# Patient Record
Sex: Female | Born: 1970 | Race: White | Hispanic: No | Smoking: Never smoker
Health system: Southern US, Community
[De-identification: ages and names within clinical notes are randomized; demographics above are authoritative.]

## PROBLEM LIST (undated history)

## (undated) DIAGNOSIS — F32A Depression, unspecified: Secondary | ICD-10-CM

## (undated) DIAGNOSIS — M419 Scoliosis, unspecified: Secondary | ICD-10-CM

## (undated) DIAGNOSIS — Z8489 Family history of other specified conditions: Secondary | ICD-10-CM

## (undated) DIAGNOSIS — M2550 Pain in unspecified joint: Secondary | ICD-10-CM

## (undated) DIAGNOSIS — M7989 Other specified soft tissue disorders: Secondary | ICD-10-CM

## (undated) DIAGNOSIS — G4730 Sleep apnea, unspecified: Secondary | ICD-10-CM

## (undated) DIAGNOSIS — R0602 Shortness of breath: Secondary | ICD-10-CM

## (undated) DIAGNOSIS — I10 Essential (primary) hypertension: Secondary | ICD-10-CM

## (undated) DIAGNOSIS — R7303 Prediabetes: Secondary | ICD-10-CM

## (undated) DIAGNOSIS — M549 Dorsalgia, unspecified: Secondary | ICD-10-CM

## (undated) HISTORY — DX: Dorsalgia, unspecified: M54.9

## (undated) HISTORY — DX: Other specified soft tissue disorders: M79.89

## (undated) HISTORY — DX: Shortness of breath: R06.02

## (undated) HISTORY — DX: Pain in unspecified joint: M25.50

## (undated) HISTORY — DX: Essential (primary) hypertension: I10

## (undated) HISTORY — DX: Prediabetes: R73.03

## (undated) HISTORY — PX: WISDOM TOOTH EXTRACTION: SHX21

## (undated) HISTORY — DX: Sleep apnea, unspecified: G47.30

## (undated) HISTORY — DX: Depression, unspecified: F32.A

---

## 2016-12-22 ENCOUNTER — Encounter (HOSPITAL_COMMUNITY): Payer: Self-pay | Admitting: Nurse Practitioner

## 2016-12-22 ENCOUNTER — Emergency Department (HOSPITAL_COMMUNITY)
Admission: EM | Admit: 2016-12-22 | Discharge: 2016-12-22 | Disposition: A | Discharge status: Home / Self Care | Payer: No Typology Code available for payment source | Attending: Emergency Medicine | Admitting: Emergency Medicine

## 2016-12-22 DIAGNOSIS — T148XXA Other injury of unspecified body region, initial encounter: Secondary | ICD-10-CM

## 2016-12-22 DIAGNOSIS — S4992XA Unspecified injury of left shoulder and upper arm, initial encounter: Secondary | ICD-10-CM | POA: Diagnosis present

## 2016-12-22 DIAGNOSIS — Y92410 Unspecified street and highway as the place of occurrence of the external cause: Secondary | ICD-10-CM | POA: Diagnosis not present

## 2016-12-22 DIAGNOSIS — Y9389 Activity, other specified: Secondary | ICD-10-CM | POA: Diagnosis not present

## 2016-12-22 DIAGNOSIS — S46912A Strain of unspecified muscle, fascia and tendon at shoulder and upper arm level, left arm, initial encounter: Secondary | ICD-10-CM | POA: Diagnosis not present

## 2016-12-22 DIAGNOSIS — Y999 Unspecified external cause status: Secondary | ICD-10-CM | POA: Insufficient documentation

## 2016-12-22 MED ORDER — CYCLOBENZAPRINE HCL 5 MG PO TABS: 5.0000 mg | ORAL_TABLET | Freq: Three times a day (TID) | ORAL | 0 refills | Status: DC | PRN
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
## 2021-01-11 ENCOUNTER — Other Ambulatory Visit: Payer: Self-pay

## 2021-01-11 ENCOUNTER — Emergency Department (HOSPITAL_COMMUNITY)
Admission: EM | Admit: 2021-01-11 | Discharge: 2021-01-11 | Disposition: A | Discharge status: Home / Self Care | Payer: Self-pay | Attending: Emergency Medicine | Admitting: Emergency Medicine

## 2021-01-11 ENCOUNTER — Encounter (HOSPITAL_COMMUNITY): Payer: Self-pay

## 2021-01-11 DIAGNOSIS — M792 Neuralgia and neuritis, unspecified: Secondary | ICD-10-CM | POA: Insufficient documentation

## 2021-01-11 MED ORDER — GABAPENTIN 100 MG PO CAPS: 100.0000 mg | ORAL_CAPSULE | Freq: Three times a day (TID) | ORAL | 0 refills | Status: DC
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
## 2021-01-11 NOTE — ED Notes (Signed)
An After Visit Summary was printed and given to the patient. Discharge instructions given and no further questions at this time.  

## 2021-01-11 NOTE — ED Triage Notes (Signed)
Patient c/o mid back pain since January. Patient states she has been seen twice for the same and states she had an x-ray yesterday.

## 2021-01-11 NOTE — Discharge Instructions (Addendum)
Your symptoms may be due to bra neuralgia or irritation to the nerve along your bra strap.  Please try using a larger bra or loosefitting bra to decrease irritation.  Take gabapentin as prescribed.  Follow-up with your doctor for further care.  Return if you have any concern.

## 2021-01-16 ENCOUNTER — Emergency Department (HOSPITAL_COMMUNITY): Payer: Self-pay

## 2021-01-16 ENCOUNTER — Inpatient Hospital Stay (HOSPITAL_BASED_OUTPATIENT_CLINIC_OR_DEPARTMENT_OTHER)
Admission: EM | Admit: 2021-01-16 | Discharge: 2021-01-24 | DRG: 478 | Disposition: A | Discharge status: Home Health | Payer: Self-pay | Attending: Internal Medicine | Admitting: Internal Medicine

## 2021-01-16 ENCOUNTER — Encounter (HOSPITAL_BASED_OUTPATIENT_CLINIC_OR_DEPARTMENT_OTHER): Payer: Self-pay

## 2021-01-16 ENCOUNTER — Telehealth: Payer: Self-pay | Admitting: Physician Assistant

## 2021-01-16 ENCOUNTER — Other Ambulatory Visit: Payer: Self-pay

## 2021-01-16 ENCOUNTER — Emergency Department (HOSPITAL_BASED_OUTPATIENT_CLINIC_OR_DEPARTMENT_OTHER): Payer: Self-pay

## 2021-01-16 DIAGNOSIS — R946 Abnormal results of thyroid function studies: Secondary | ICD-10-CM | POA: Diagnosis present

## 2021-01-16 DIAGNOSIS — E876 Hypokalemia: Secondary | ICD-10-CM | POA: Diagnosis not present

## 2021-01-16 DIAGNOSIS — Z20822 Contact with and (suspected) exposure to covid-19: Secondary | ICD-10-CM | POA: Diagnosis present

## 2021-01-16 DIAGNOSIS — R0689 Other abnormalities of breathing: Secondary | ICD-10-CM | POA: Diagnosis present

## 2021-01-16 DIAGNOSIS — S22000A Wedge compression fracture of unspecified thoracic vertebra, initial encounter for closed fracture: Secondary | ICD-10-CM

## 2021-01-16 DIAGNOSIS — K5900 Constipation, unspecified: Secondary | ICD-10-CM | POA: Diagnosis not present

## 2021-01-16 DIAGNOSIS — M419 Scoliosis, unspecified: Secondary | ICD-10-CM | POA: Diagnosis present

## 2021-01-16 DIAGNOSIS — R079 Chest pain, unspecified: Secondary | ICD-10-CM

## 2021-01-16 DIAGNOSIS — Z6841 Body Mass Index (BMI) 40.0 and over, adult: Secondary | ICD-10-CM

## 2021-01-16 DIAGNOSIS — M4624 Osteomyelitis of vertebra, thoracic region: Secondary | ICD-10-CM | POA: Diagnosis present

## 2021-01-16 DIAGNOSIS — Z841 Family history of disorders of kidney and ureter: Secondary | ICD-10-CM

## 2021-01-16 DIAGNOSIS — Z597 Insufficient social insurance and welfare support: Secondary | ICD-10-CM

## 2021-01-16 DIAGNOSIS — Z79899 Other long term (current) drug therapy: Secondary | ICD-10-CM

## 2021-01-16 DIAGNOSIS — M4640 Discitis, unspecified, site unspecified: Secondary | ICD-10-CM

## 2021-01-16 DIAGNOSIS — M545 Low back pain, unspecified: Secondary | ICD-10-CM

## 2021-01-16 DIAGNOSIS — M2578 Osteophyte, vertebrae: Secondary | ICD-10-CM | POA: Diagnosis present

## 2021-01-16 DIAGNOSIS — Z833 Family history of diabetes mellitus: Secondary | ICD-10-CM

## 2021-01-16 DIAGNOSIS — M4644 Discitis, unspecified, thoracic region: Principal | ICD-10-CM

## 2021-01-16 DIAGNOSIS — R140 Abdominal distension (gaseous): Secondary | ICD-10-CM | POA: Diagnosis present

## 2021-01-16 DIAGNOSIS — Z8249 Family history of ischemic heart disease and other diseases of the circulatory system: Secondary | ICD-10-CM

## 2021-01-16 DIAGNOSIS — M4854XA Collapsed vertebra, not elsewhere classified, thoracic region, initial encounter for fracture: Secondary | ICD-10-CM | POA: Diagnosis present

## 2021-01-16 HISTORY — DX: Family history of other specified conditions: Z84.89

## 2021-01-16 HISTORY — DX: Scoliosis, unspecified: M41.9

## 2021-01-16 HISTORY — DX: Morbid (severe) obesity due to excess calories: E66.01

## 2021-01-16 LAB — CBC WITH DIFFERENTIAL/PLATELET
Abs Immature Granulocytes: 0.03 10*3/uL (ref 0.00–0.07)
Basophils Absolute: 0 10*3/uL (ref 0.0–0.1)
Basophils Relative: 0 %
Eosinophils Absolute: 0.2 10*3/uL (ref 0.0–0.5)
Eosinophils Relative: 2 %
HCT: 38.1 % (ref 36.0–46.0)
Hemoglobin: 12.3 g/dL (ref 12.0–15.0)
Immature Granulocytes: 0 %
Lymphocytes Relative: 16 %
Lymphs Abs: 1.8 10*3/uL (ref 0.7–4.0)
MCH: 29.8 pg (ref 26.0–34.0)
MCHC: 32.3 g/dL (ref 30.0–36.0)
MCV: 92.3 fL (ref 80.0–100.0)
Monocytes Absolute: 0.6 10*3/uL (ref 0.1–1.0)
Monocytes Relative: 5 %
Neutro Abs: 8.7 10*3/uL — ABNORMAL HIGH (ref 1.7–7.7)
Neutrophils Relative %: 77 %
Platelets: 404 10*3/uL — ABNORMAL HIGH (ref 150–400)
RBC: 4.13 MIL/uL (ref 3.87–5.11)
RDW: 14.3 % (ref 11.5–15.5)
WBC: 11.3 10*3/uL — ABNORMAL HIGH (ref 4.0–10.5)
nRBC: 0 % (ref 0.0–0.2)

## 2021-01-16 LAB — COMPREHENSIVE METABOLIC PANEL
ALT: 14 U/L (ref 0–44)
AST: 12 U/L — ABNORMAL LOW (ref 15–41)
Albumin: 3.9 g/dL (ref 3.5–5.0)
Alkaline Phosphatase: 69 U/L (ref 38–126)
Anion gap: 10 (ref 5–15)
BUN: 9 mg/dL (ref 6–20)
CO2: 26 mmol/L (ref 22–32)
Calcium: 9.1 mg/dL (ref 8.9–10.3)
Chloride: 104 mmol/L (ref 98–111)
Creatinine, Ser: 0.71 mg/dL (ref 0.44–1.00)
GFR, Estimated: >60 mL/min (ref >60)
Glucose, Bld: 114 mg/dL — ABNORMAL HIGH (ref 70–99)
Potassium: 3.9 mmol/L (ref 3.5–5.1)
Sodium: 140 mmol/L (ref 135–145)
Total Bilirubin: 1 mg/dL (ref 0.3–1.2)
Total Protein: 7.1 g/dL (ref 6.5–8.1)

## 2021-01-16 LAB — TROPONIN I (HIGH SENSITIVITY): Troponin I (High Sensitivity): 2 ng/L (ref <18)

## 2021-01-16 LAB — LIPASE, BLOOD: Lipase: 13 U/L (ref 11–51)

## 2021-01-16 LAB — RESP PANEL BY RT-PCR (FLU A&B, COVID) ARPGX2
Influenza A by PCR: NEGATIVE
Influenza B by PCR: NEGATIVE
SARS Coronavirus 2 by RT PCR: NEGATIVE

## 2021-01-16 IMAGING — MR MR THORACIC SPINE WO/W CM
4 of 9 series · 17 of 48 positions shown · IV contrast (gadavist)
Comparison: Prior CT from [DATE]

CLINICAL DATA: Initial evaluation for compression fracture.

EXAM:
MRI THORACIC WITHOUT AND WITH CONTRAST
TECHNIQUE: Multiplanar and multiecho pulse sequences of the thoracic spine were
obtained without and with intravenous contrast.
CONTRAST:  10mL GADAVIST GADOBUTROL 1 MMOL/ML IV SOLN

[Series 3: T1 · sagittal · 3.0mm · 0.90mm/px · 3 of 14 slices shown (1 of 2)]
[im 1/14]
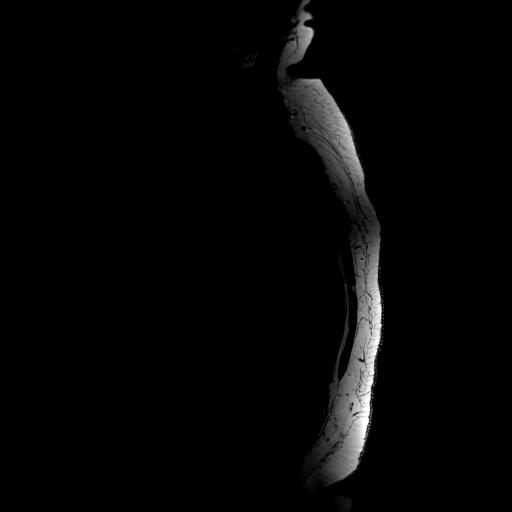
[im 9/14]
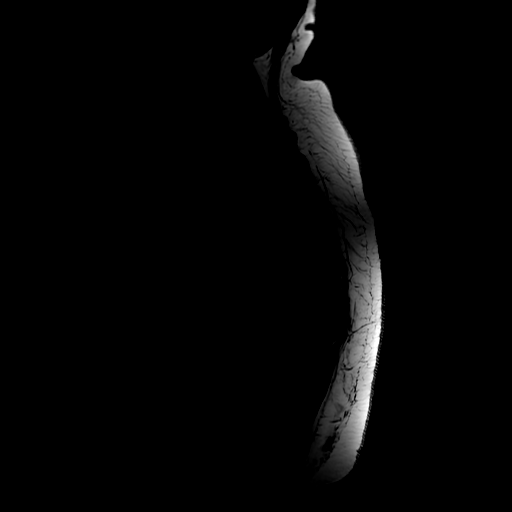
[im 14/14]
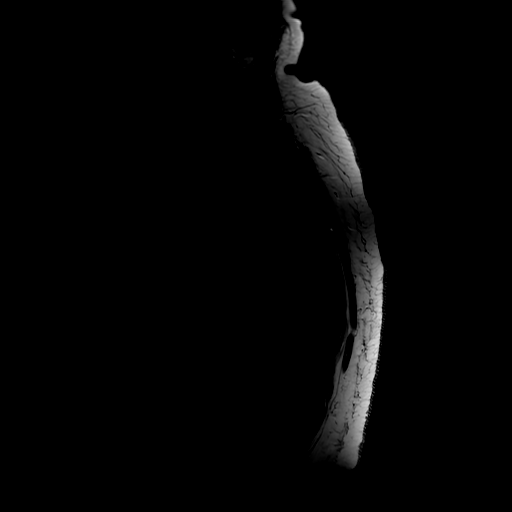

[Series 4: T2 · sagittal · 3.0mm · 0.66mm/px · 3 of 15 slices shown (1 of 2)]
[im 1/15]
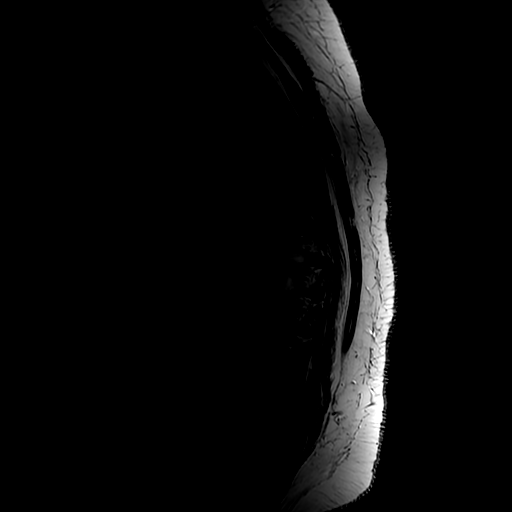
[im 8/15]
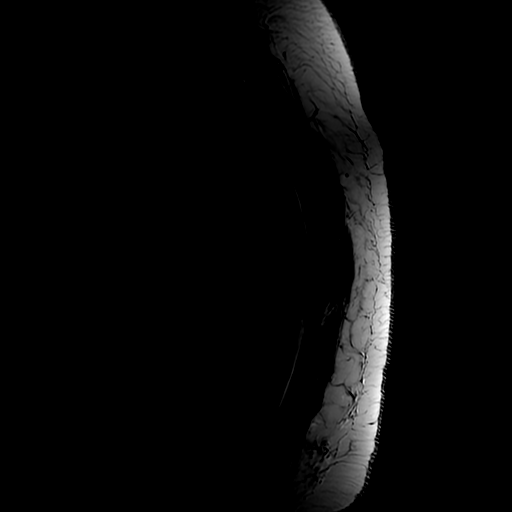
[im 15/15]
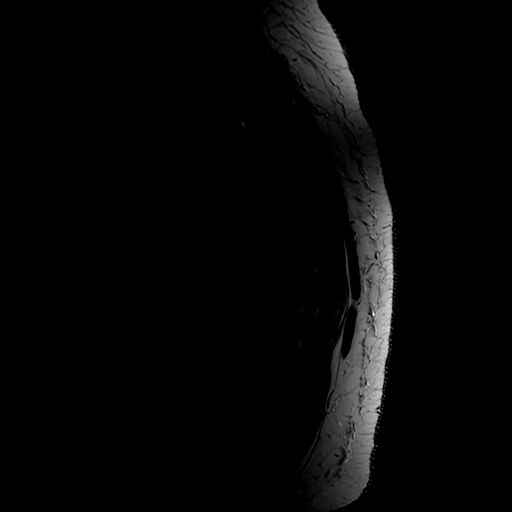

[Series 6: T1 · sagittal · 3.0mm · 0.66mm/px · 3 of 15 slices shown (2 of 2)]
[im 1/15]
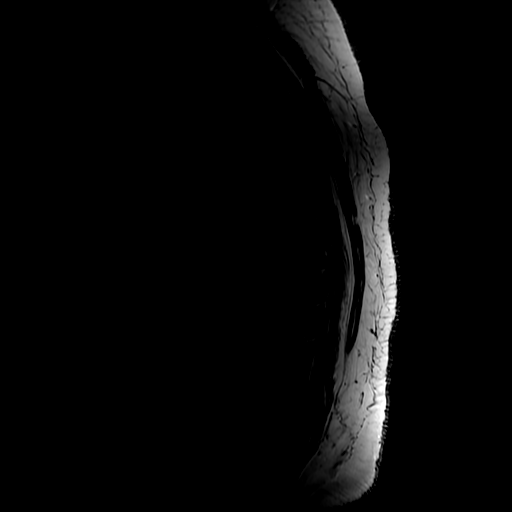
[im 8/15]
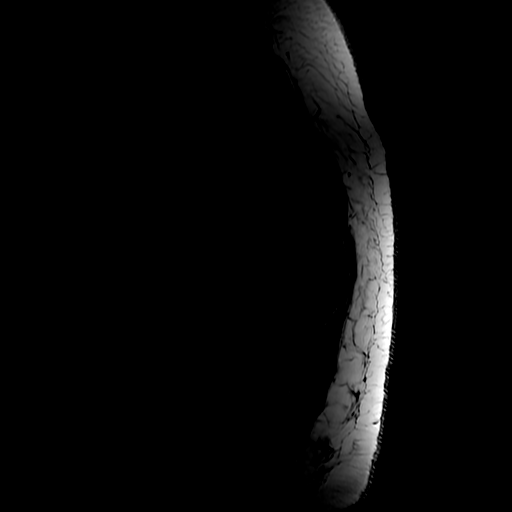
[im 15/15]
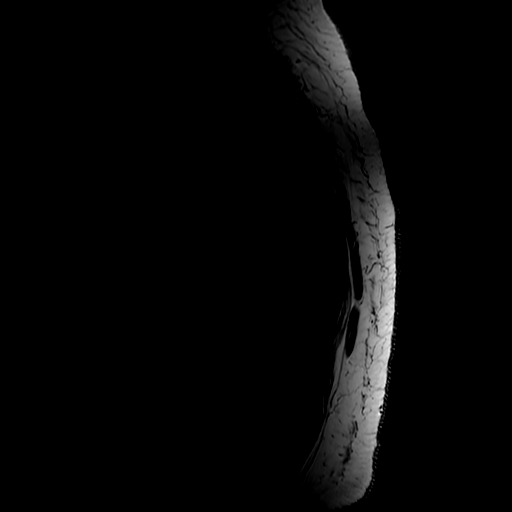

[Series 7: T2 · axial · 4.0mm · 0.39mm/px · z∈[-126,+53]mm · 8 of 34 slices shown (2 of 2)]
[im 1/34]
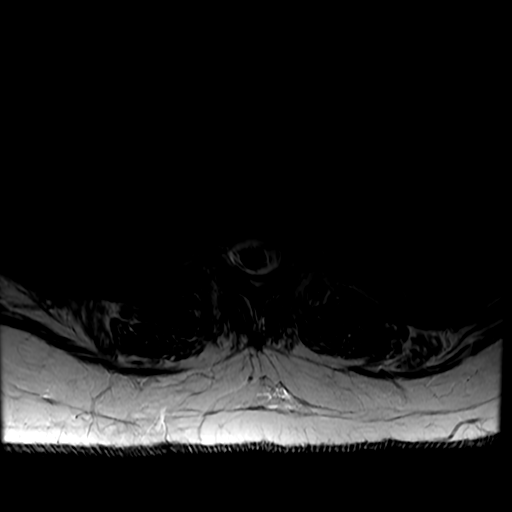
[im 5/34]
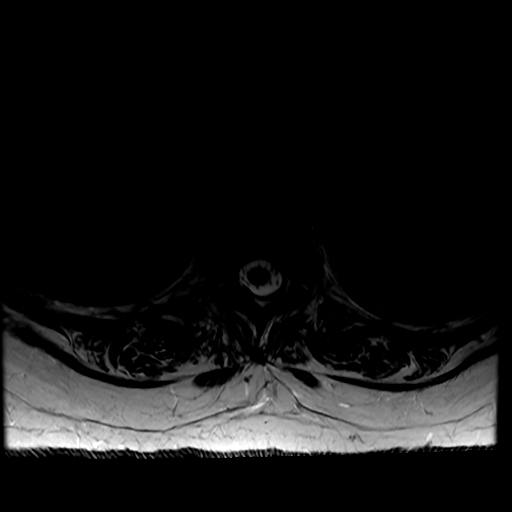
[im 10/34]
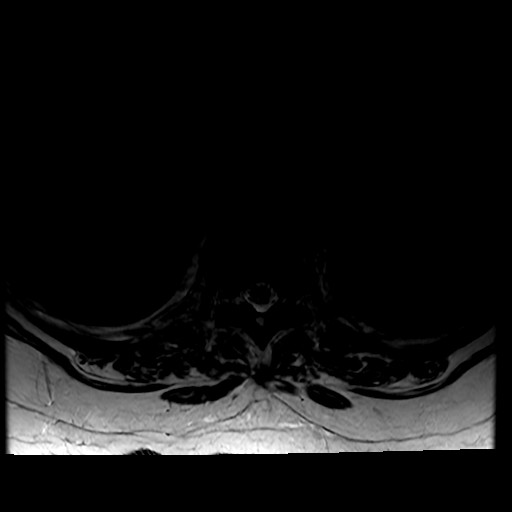
[im 15/34]
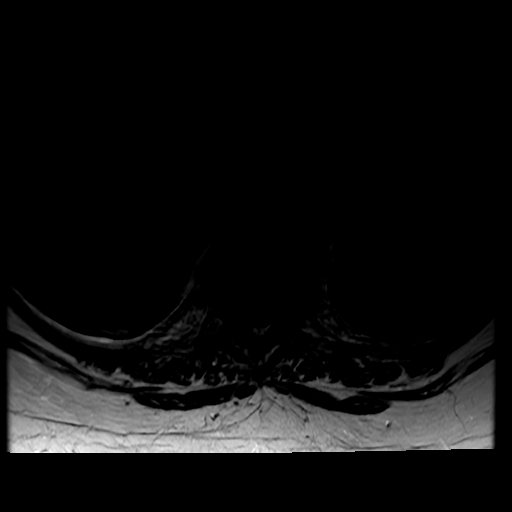
[im 19/34]
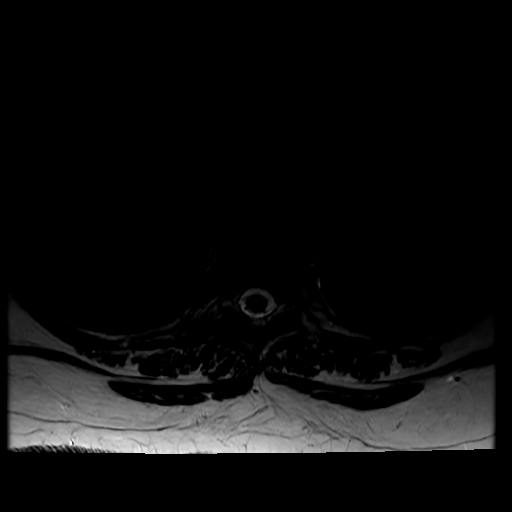
[im 24/34]
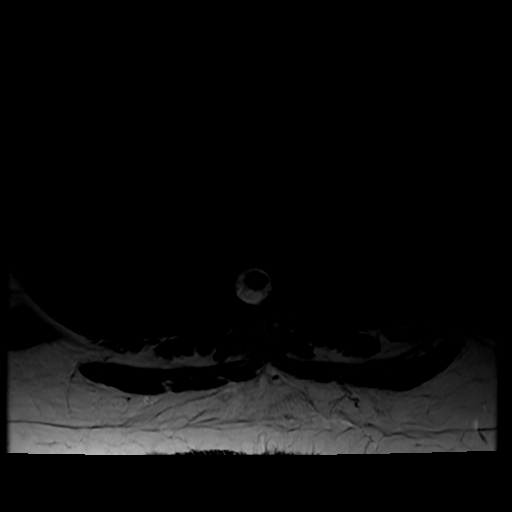
[im 29/34]
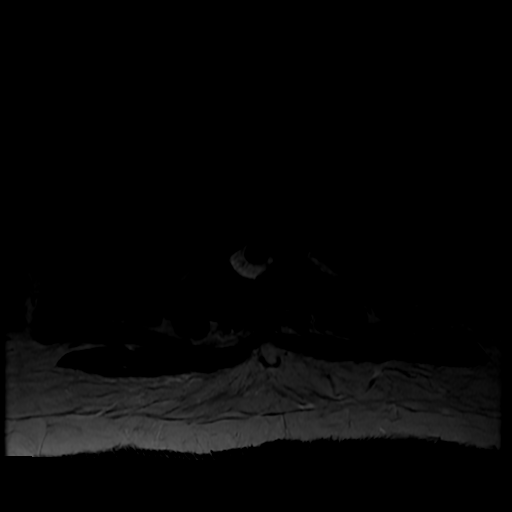
[im 34/34]
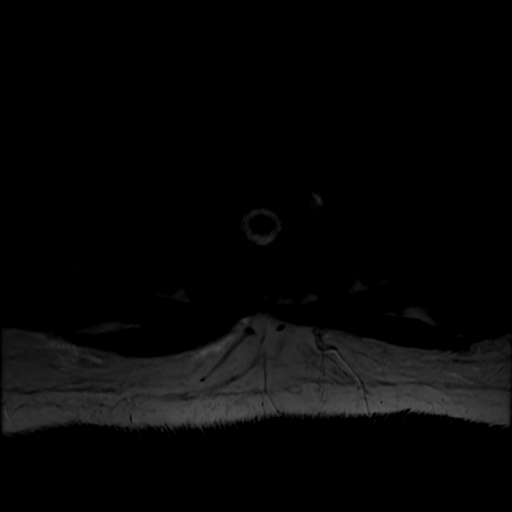

[17 of 48 positions shown; findings below may reference images not displayed]

FINDINGS: Alignment: Slight exaggeration of the normal thoracic kyphosis, apex
at T8-9. No listhesis.

Vertebrae: Abnormal edema and enhancement with loss of disc space
height and endplate irregularity seen about the T8-9 interspace,
consistent with acute osteomyelitis discitis. Associated height loss
of up to approximately 75% at the adjacent T9 vertebral body, with
more moderate approximate 50% height loss at T8. Edema and
enhancement extends into the T8-9 facets bilaterally, consistent
with associated septic arthritis. Evidence for associated epidural
phlegmon and enhancement within both the ventral and dorsal epidural
space at the level of T8-9 (series 10, image 8). No frank epidural
abscess visible on this motion degraded exam. Additionally, mild
marrow edema and enhancement with disc space height loss noted about
the adjacent T7-8 interspace, also concerning for acute
osteomyelitis discitis as well. This is more mild in appearance as
compared to the adjacent T8-9 interspace. Associated paraspinous
edema and enhancement adjacent to the infected T8-9 interspace
without visible soft tissue abscess.

No other sites of acute infection within the thoracic spine.
Vertebral body height otherwise maintained. Diffusely decreased T1
signal intensity seen throughout the visualized bone marrow,
nonspecific, but most commonly related to anemia, smoking or
obesity. No worrisome osseous lesions.

Cord: No convincing cord signal abnormality seen on this motion
degraded exam.

Paraspinal and other soft tissues: Paraspinous edema and enhancement
adjacent to the T8-9 interspace, consistent with infection. No
discrete soft tissue collections or abscesses. Small layering
bilateral pleural effusions noted.

Disc levels:

T8-9: Findings consistent with osteomyelitis discitis. Associated
compression deformity at the superior endplate of T9 with up to
50-60% height loss and 4 mm bony retropulsion. Epidural phlegmon and
enhancement within the ventral and dorsal epidural space without
definite frank abscess formation. Resultant moderate spinal stenosis
with mild cord flattening. No definite cord signal changes on this
motion degraded exam. Moderate right worse than left foraminal
narrowing.

Additional fairly mild degenerative changes for age noted elsewhere
within the thoracic spine. No other significant canal or foraminal
stenosis. No other neural impingement.
IMPRESSION: 1. Findings consistent with acute osteomyelitis discitis and septic
arthritis at T8-9. Associated compression fracture of T9 with up to
75% anterior height loss and 4 mm bony retropulsion, with 50% height
loss at T8. Superimposed epidural phlegmon at this level with
resultant moderate spinal stenosis and mild cord flattening, but no
definite cord signal changes on this motion degraded exam.
2. Mild edema and enhancement about the adjacent T7-8 interspace,
also consistent with acute osteomyelitis discitis.
3. Associated paraspinous edema and enhancement adjacent to the T8-9
interspace without discrete soft tissue abscess.
4. Small layering bilateral pleural effusions.

## 2021-01-16 IMAGING — CT CT ANGIO CHEST
2 of 3 series · 18 of 32 positions shown · IV contrast (omnipaque)
Comparison: None.

CLINICAL DATA: Upper back pain that radiates.

EXAM:
CT ANGIOGRAPHY CHEST
CT ABDOMEN AND PELVIS WITH CONTRAST
TECHNIQUE: Multidetector CT imaging of the chest was performed using the
standard protocol during bolus administration of intravenous
contrast. Multiplanar CT image reconstructions and MIPs were
obtained to evaluate the vascular anatomy. Multidetector CT imaging
of the abdomen and pelvis was performed using the standard protocol
during bolus administration of intravenous contrast.
CONTRAST:  100mL OMNIPAQUE IOHEXOL 350 MG/ML SOLN

[Series 3: arterial · axial · arterial · 0.69mm/px · z∈[+1269,+1489]mm · 11 of 133 slices shown]
[im 12/133  lung]
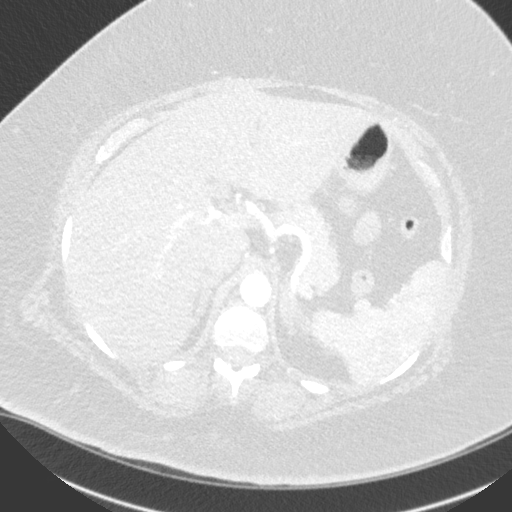
[im 23/133  soft-tissue]
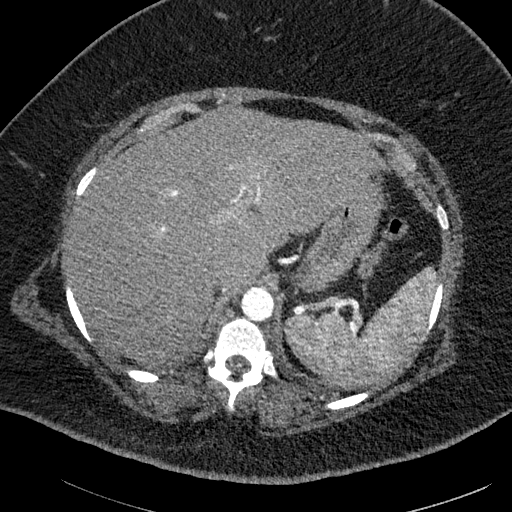
[im 34/133  lung]
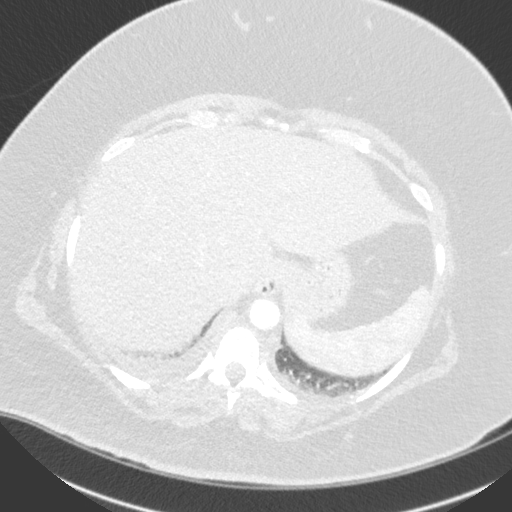
[im 45/133  soft-tissue]
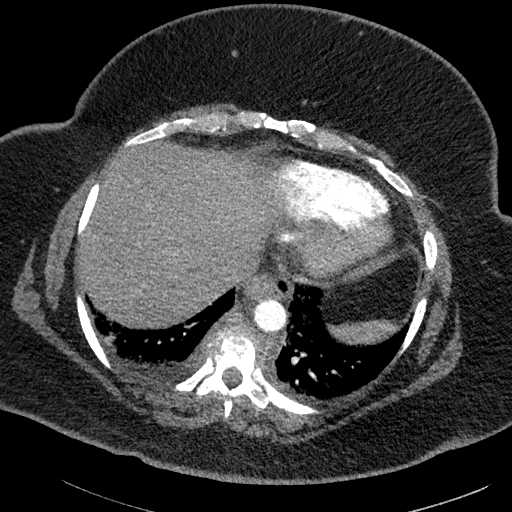
[im 56/133  lung]
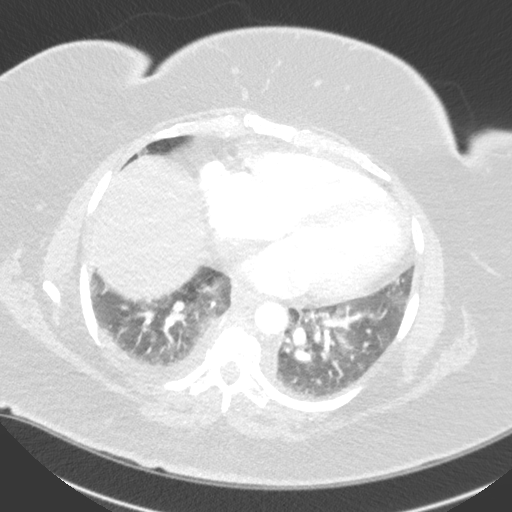
[im 67/133  soft-tissue]
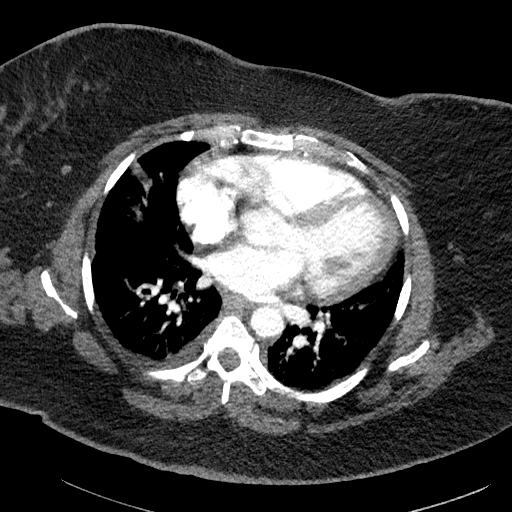
[im 78/133  lung]
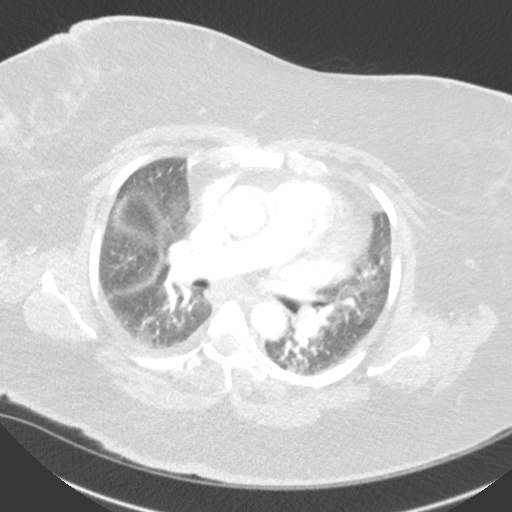
[im 89/133  soft-tissue]
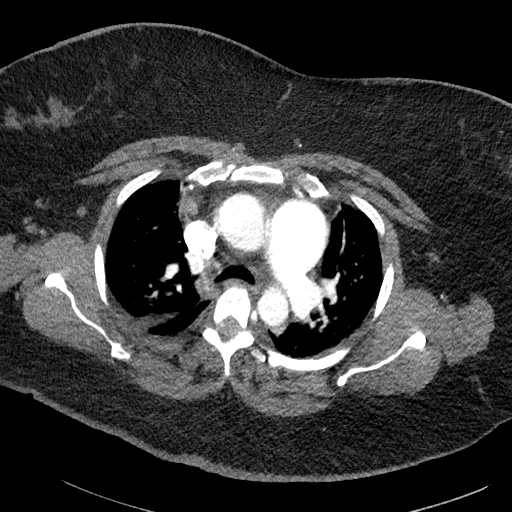
[im 100/133  lung]
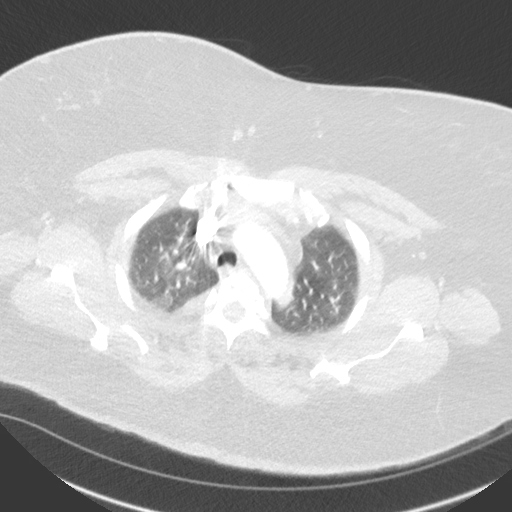
[im 111/133  soft-tissue]
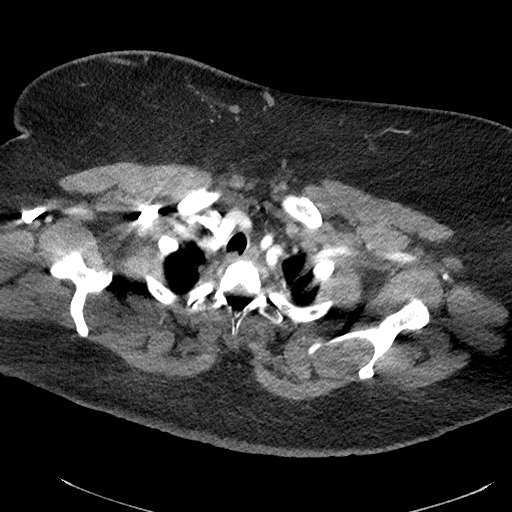
[im 122/133  lung]
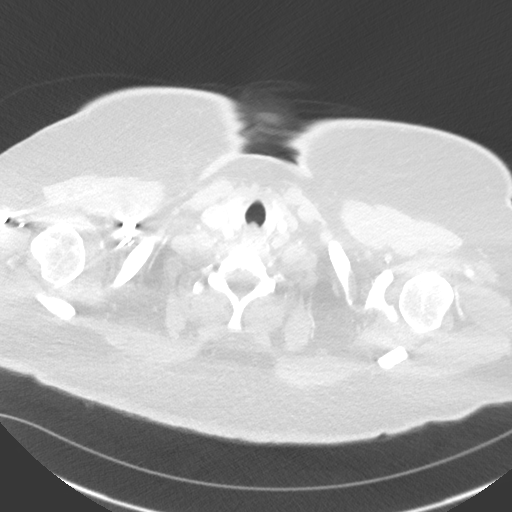

[Series 5: lung · axial · 0.69mm/px · z∈[+1269,+1445]mm · 7 of 133 slices shown]
[im 12/133  soft-tissue]
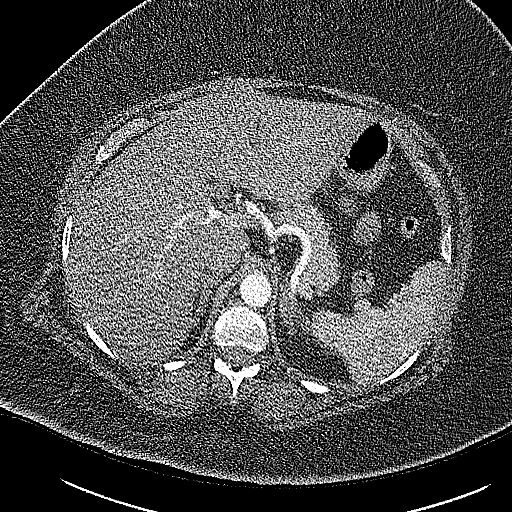
[im 34/133  soft-tissue]
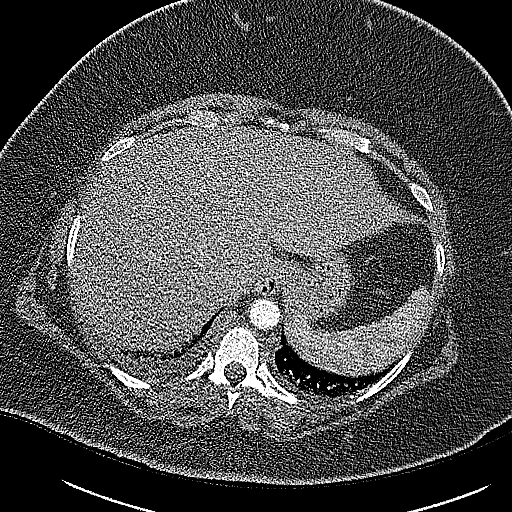
[im 45/133  soft-tissue]
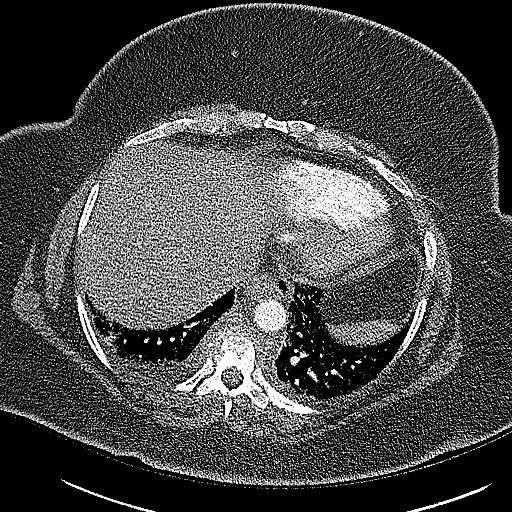
[im 56/133  soft-tissue]
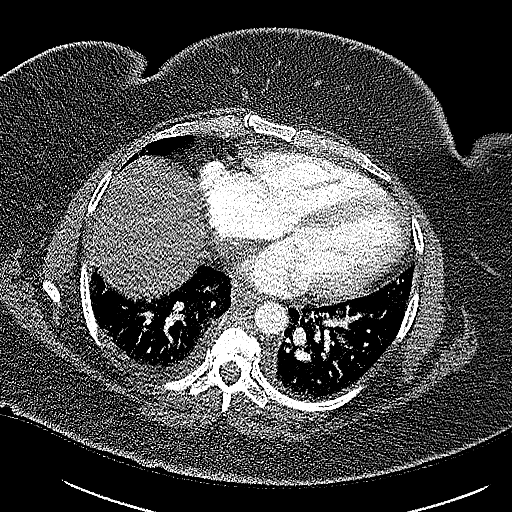
[im 78/133  soft-tissue]
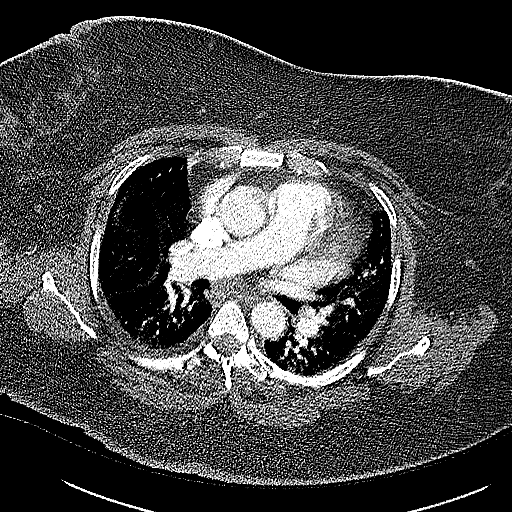
[im 89/133  soft-tissue]
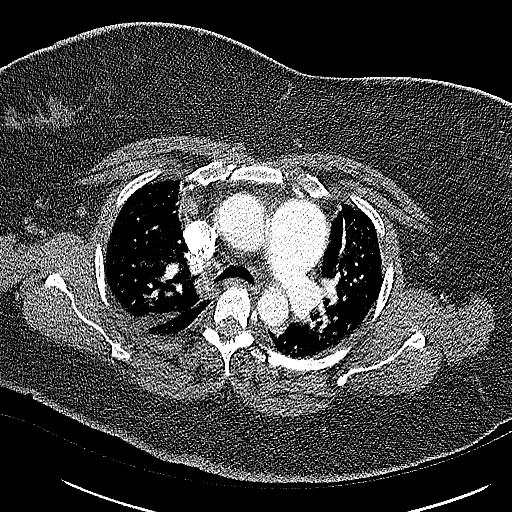
[im 100/133  soft-tissue]
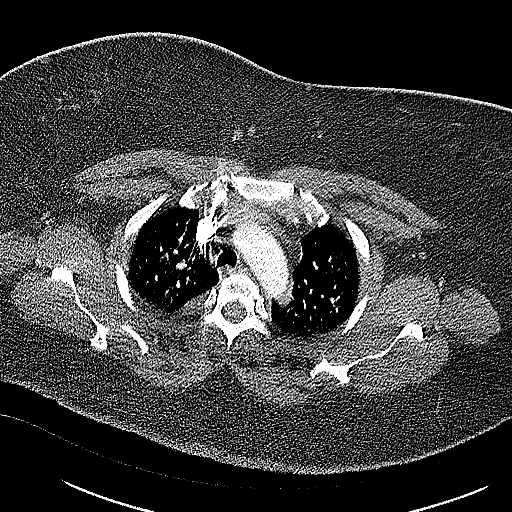

[18 of 32 positions shown; findings below may reference images not displayed]

FINDINGS: CTA CHEST FINDINGS

Cardiovascular: Noncontrast imaging demonstrates no intramural
hematoma within the thoracic aorta. Contrast enhanced imaging
demonstrates no aortic dissection or aneurysm. Great vessels are
normal. No pericardial fluid.

The main pulmonary artery is prominent at 40 mm. No filling defects
within the pulmonary arteries identified.

Mediastinum/Nodes: No axillary or supraclavicular adenopathy. No
mediastinal or hilar adenopathy. No pericardial fluid. Esophagus
normal.

Lungs/Pleura: low lung volumes. Small bilateral pleural effusions.
Bibasilar passive atelectasis. There is interlobular septal
thickening consistent with interstitial edema. No pneumothorax. No
pneumonia or infarction.

Musculoskeletal: There is a compression deformity superior endplate
of the T9 vertebral body with focal kyphosis. There is loss of
cortical definition at this level. Additionally there is soft tissue
thickening in the paravertebral space at this level (image 88/3
axial and sagittal image 96/series 7).

Review of the MIP images confirms the above findings.

CT ABDOMEN and PELVIS FINDINGS

Hepatobiliary: No focal hepatic lesion. No biliary duct dilatation.
Common bile duct is normal.

Pancreas: Pancreas is normal. No ductal dilatation. No pancreatic
inflammation.

Spleen: Normal spleen

Adrenals/urinary tract: Adrenal glands and kidneys are normal. The
ureters and bladder normal.

Stomach/Bowel: Stomach, small-bowel and cecum are normal. The
appendix is not identified but there is no pericecal inflammation to
suggest appendicitis. The colon and rectosigmoid colon are normal.

Vascular/Lymphatic: Abdominal aorta is normal caliber. No periportal
or retroperitoneal adenopathy. No pelvic adenopathy.

Reproductive: Uterus and ovaries normal.

Other: No free fluid.

Musculoskeletal: No aggressive osseous lesion.

Review of the MIP images confirms the above findings.
IMPRESSION: Chest Impression:

1. Loss of superior endplate height at T9 with associated
inflammatory process in the perivertebral soft tissue. Findings
concerning for discitis - osteomyelitis. RECOMMEND MRI of the
thoracic spine without with contrast.
2. No evidence of aortic dissection or aneurysm.
3. Prominent main pulmonary artery suggest pulmonary hypertension.
4. Low lung volumes

Abdomen / Pelvis Impression:

1. No acute findings in the abdomen pelvis.

Findings conveyed [facility]OS[facility] on [DATE]  at[DATE].

## 2021-01-16 IMAGING — CT CT ABD-PELV W/ CM
1 of 3 series · 13 of 32 positions shown, 18 images · IV contrast (omnipaque)
Comparison: None.

CLINICAL DATA: Upper back pain that radiates.

EXAM:
CT ANGIOGRAPHY CHEST
CT ABDOMEN AND PELVIS WITH CONTRAST
TECHNIQUE: Multidetector CT imaging of the chest was performed using the
standard protocol during bolus administration of intravenous
contrast. Multiplanar CT image reconstructions and MIPs were
obtained to evaluate the vascular anatomy. Multidetector CT imaging
of the abdomen and pelvis was performed using the standard protocol
during bolus administration of intravenous contrast.
CONTRAST:  100mL OMNIPAQUE IOHEXOL 350 MG/ML SOLN

[Series 4: abd pel w · axial · 0.81mm/px · z∈[+935,+1360]mm · 13 of 99 slices shown, 18 images]
[im 7/99  soft-tissue]
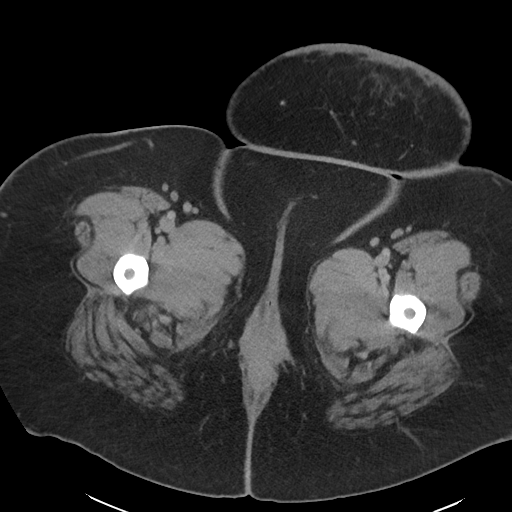
[im 7/99  bone]
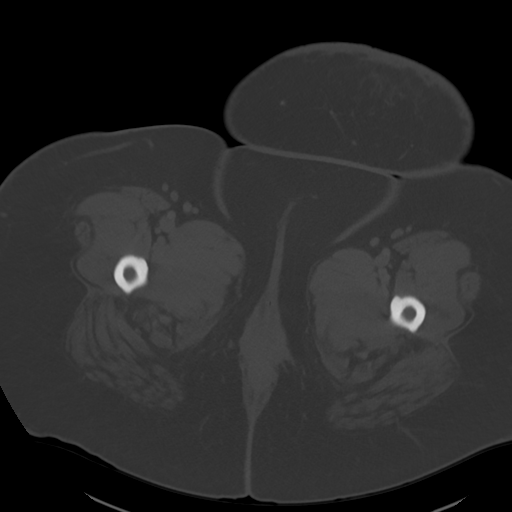
[im 13/99  soft-tissue]
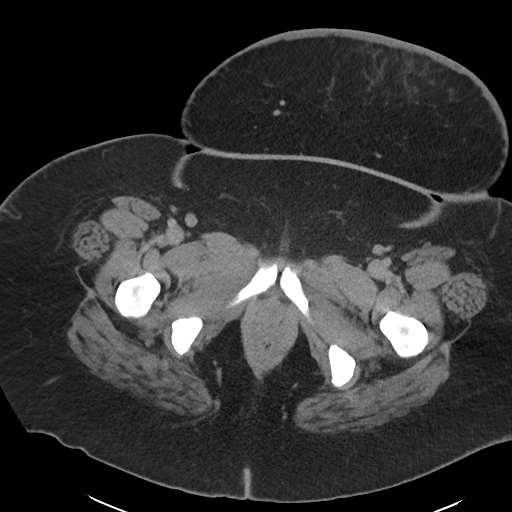
[im 25/99  soft-tissue]
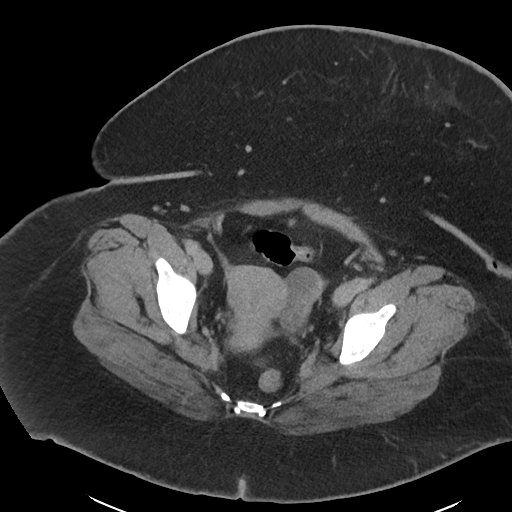
[im 31/99  soft-tissue]
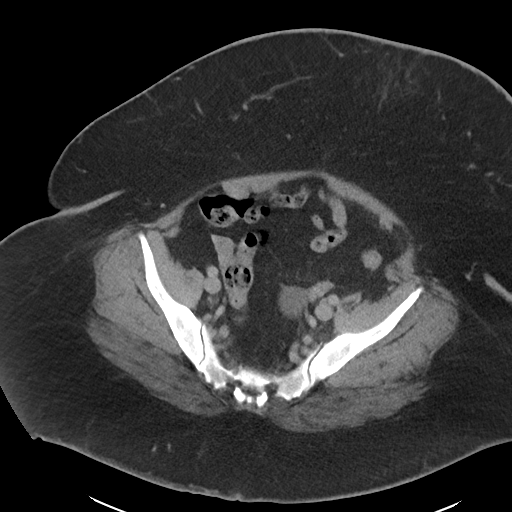
[im 37/99  soft-tissue]
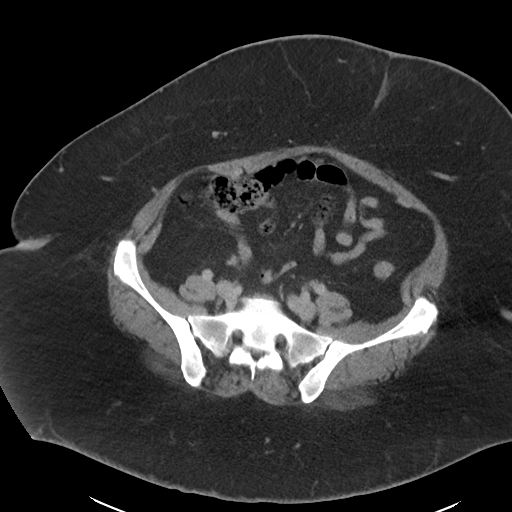
[im 43/99  soft-tissue]
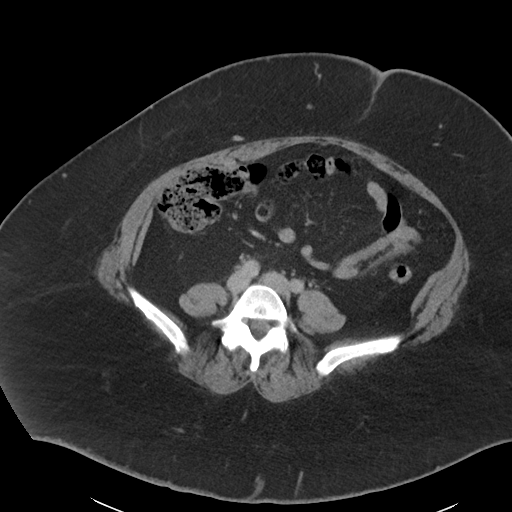
[im 56/99  soft-tissue]
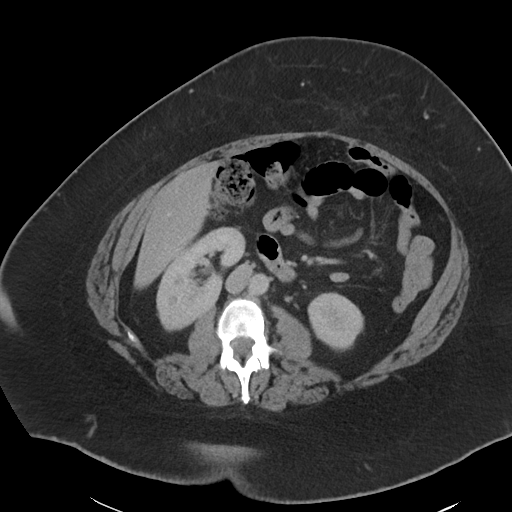
[im 62/99  soft-tissue]
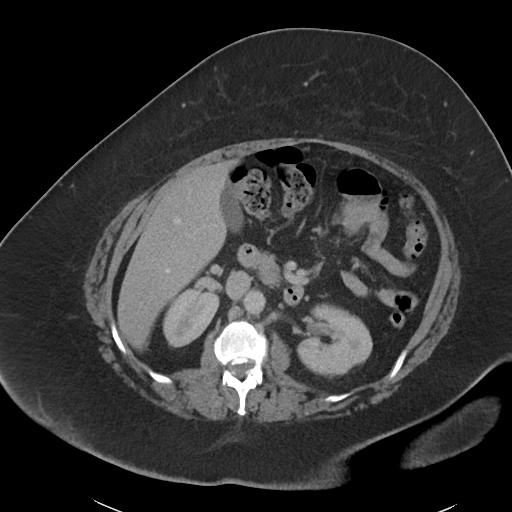
[im 68/99  soft-tissue]
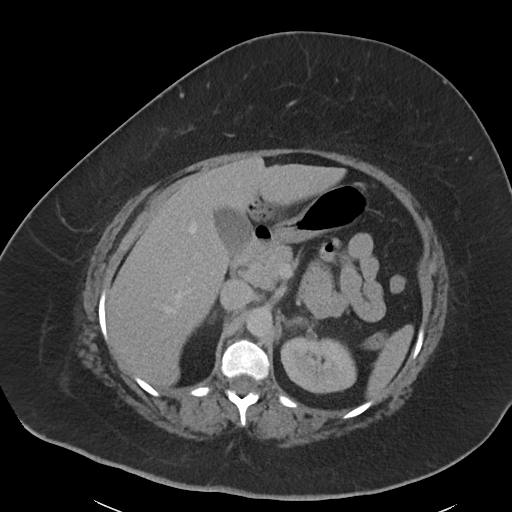
[im 68/99  bone]
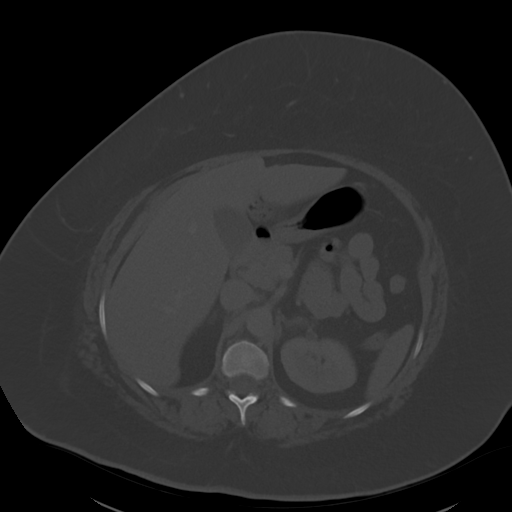
[im 74/99  soft-tissue]
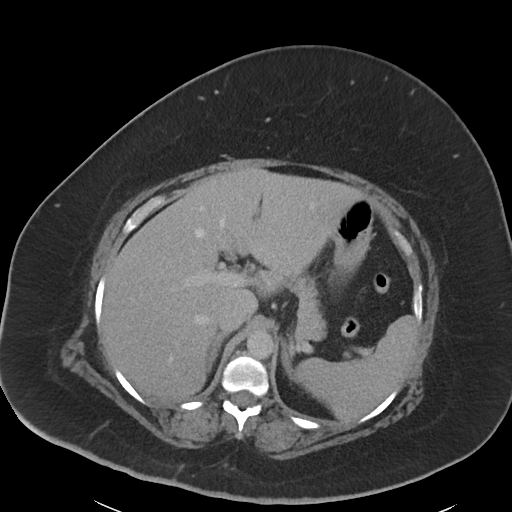
[im 74/99  lung]
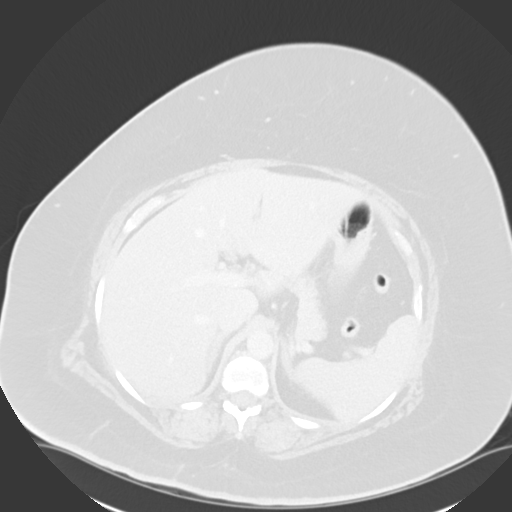
[im 80/99  lung]
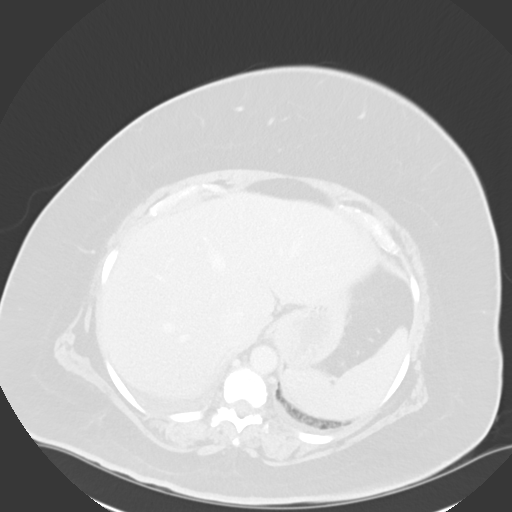
[im 86/99  soft-tissue]
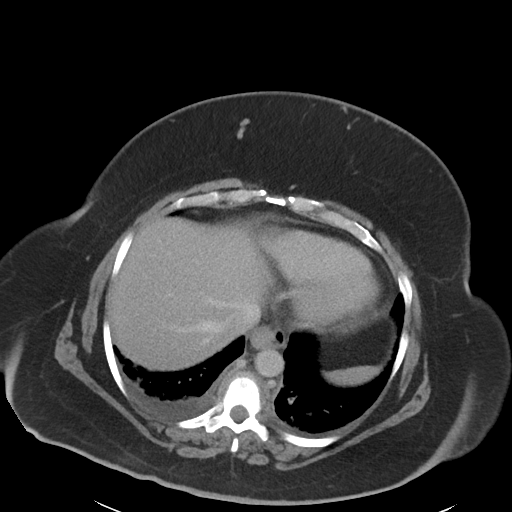
[im 86/99  lung]
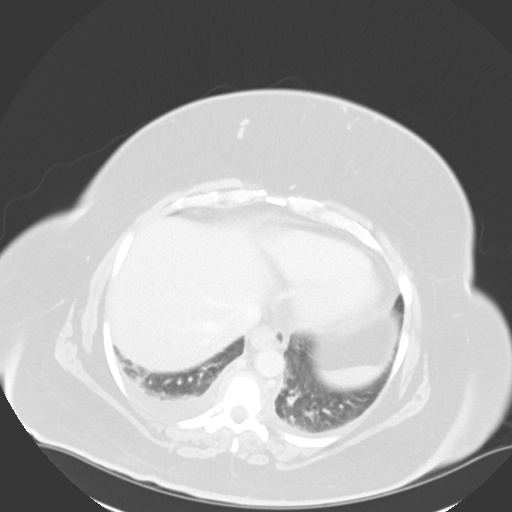
[im 92/99  soft-tissue]
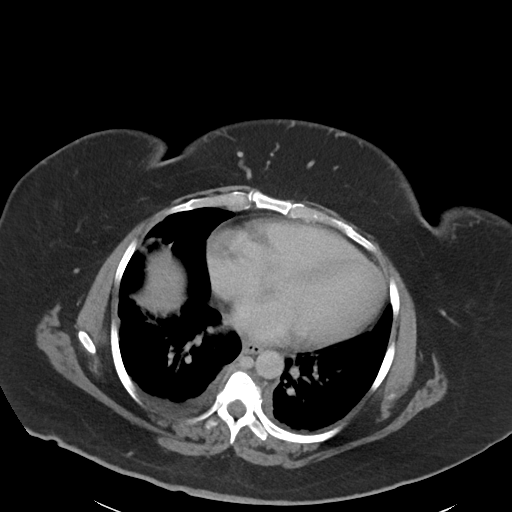
[im 92/99  lung]
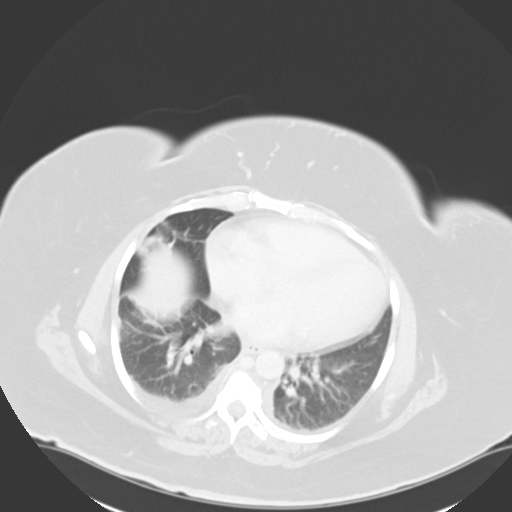

[13 of 32 positions shown; findings below may reference images not displayed]

FINDINGS: CTA CHEST FINDINGS

Cardiovascular: Noncontrast imaging demonstrates no intramural
hematoma within the thoracic aorta. Contrast enhanced imaging
demonstrates no aortic dissection or aneurysm. Great vessels are
normal. No pericardial fluid.

The main pulmonary artery is prominent at 40 mm. No filling defects
within the pulmonary arteries identified.

Mediastinum/Nodes: No axillary or supraclavicular adenopathy. No
mediastinal or hilar adenopathy. No pericardial fluid. Esophagus
normal.

Lungs/Pleura: low lung volumes. Small bilateral pleural effusions.
Bibasilar passive atelectasis. There is interlobular septal
thickening consistent with interstitial edema. No pneumothorax. No
pneumonia or infarction.

Musculoskeletal: There is a compression deformity superior endplate
of the T9 vertebral body with focal kyphosis. There is loss of
cortical definition at this level. Additionally there is soft tissue
thickening in the paravertebral space at this level (image 88/3
axial and sagittal image 96/series 7).

Review of the MIP images confirms the above findings.

CT ABDOMEN and PELVIS FINDINGS

Hepatobiliary: No focal hepatic lesion. No biliary duct dilatation.
Common bile duct is normal.

Pancreas: Pancreas is normal. No ductal dilatation. No pancreatic
inflammation.

Spleen: Normal spleen

Adrenals/urinary tract: Adrenal glands and kidneys are normal. The
ureters and bladder normal.

Stomach/Bowel: Stomach, small-bowel and cecum are normal. The
appendix is not identified but there is no pericecal inflammation to
suggest appendicitis. The colon and rectosigmoid colon are normal.

Vascular/Lymphatic: Abdominal aorta is normal caliber. No periportal
or retroperitoneal adenopathy. No pelvic adenopathy.

Reproductive: Uterus and ovaries normal.

Other: No free fluid.

Musculoskeletal: No aggressive osseous lesion.

Review of the MIP images confirms the above findings.
IMPRESSION: Chest Impression:

1. Loss of superior endplate height at T9 with associated
inflammatory process in the perivertebral soft tissue. Findings
concerning for discitis - osteomyelitis. RECOMMEND MRI of the
thoracic spine without with contrast.
2. No evidence of aortic dissection or aneurysm.
3. Prominent main pulmonary artery suggest pulmonary hypertension.
4. Low lung volumes

Abdomen / Pelvis Impression:

1. No acute findings in the abdomen pelvis.

Findings conveyed HOSHIMOV on [DATE]  at[DATE].

## 2021-01-16 MED ORDER — HYDROMORPHONE HCL 1 MG/ML IJ SOLN
1.0000 mg | Freq: Once | INTRAMUSCULAR | Status: AC
  Administered 2021-01-16: 1 mg via INTRAVENOUS
  Filled 2021-01-16: qty 1

## 2021-01-16 MED ORDER — HYDROMORPHONE HCL 1 MG/ML IJ SOLN
1.0000 mg | INTRAMUSCULAR | Status: DC | PRN
  Administered 2021-01-16 – 2021-01-21 (×23): 1 mg via INTRAVENOUS
  Filled 2021-01-16 (×23): qty 1

## 2021-01-16 MED ORDER — DIAZEPAM 5 MG PO TABS
5.0000 mg | ORAL_TABLET | Freq: Once | ORAL | Status: AC
  Administered 2021-01-16: 5 mg via ORAL
  Filled 2021-01-16: qty 1

## 2021-01-16 MED ORDER — MORPHINE SULFATE (PF) 4 MG/ML IV SOLN
4.0000 mg | Freq: Once | INTRAVENOUS | Status: AC
  Administered 2021-01-16: 4 mg via INTRAVENOUS
  Filled 2021-01-16: qty 1

## 2021-01-16 MED ORDER — ONDANSETRON HCL 4 MG/2ML IJ SOLN
4.0000 mg | Freq: Once | INTRAMUSCULAR | Status: AC
  Administered 2021-01-16: 4 mg via INTRAVENOUS
  Filled 2021-01-16: qty 2

## 2021-01-16 MED ORDER — IOHEXOL 350 MG/ML SOLN
100.0000 mL | Freq: Once | INTRAVENOUS | Status: AC | PRN
  Administered 2021-01-16: 100 mL via INTRAVENOUS

## 2021-01-16 NOTE — ED Notes (Signed)
Monitor reading low O2; patient O2 97% in room with deep breathing.

## 2021-01-16 NOTE — ED Notes (Signed)
Patient transported to MRI 

## 2021-01-16 NOTE — ED Provider Notes (Signed)
Patient received as a handoff from [facility] ED. Patient is a 50 year old female who presents with back pain. She reports pain started at the end of January. She has been pursuing conservative treatment with NSAIDs and muscle relaxers for the past few months. Thoracic spine midline tenderness on examination leg to CT scan at outside ED looking for etiology of pain. Found loss of superior endplate height at T9 with associated inflammatory process in the perivertebral soft tissue concerning for discitis or osteomyelitis. Needs MRI for further interrogation.   Physical Exam  BP 134/70 (BP Location: Left Wrist)   Pulse 92   Temp 98.1 F (36.7 C)   Resp (!) 22   Ht 4\' 10"  (1.473 m)   Wt 124.7 kg   LMP 01/05/2021   SpO2 98%   BMI 57.48 kg/m   Physical Exam Vitals and nursing note reviewed.  Constitutional:      Appearance: She is well-developed.  HENT:     Head: Normocephalic and atraumatic.  Eyes:     Conjunctiva/sclera: Conjunctivae normal.  Cardiovascular:     Rate and Rhythm: Normal rate and regular rhythm.  Pulmonary:     Effort: Pulmonary effort is normal.     Breath sounds: Normal breath sounds.  Abdominal:     Palpations: Abdomen is soft.     Tenderness: There is no abdominal tenderness.  Musculoskeletal:     Cervical back: Neck supple.  Skin:    General: Skin is warm and dry.  Neurological:     Mental Status: She is alert.     Comments: 5/5 strength in bilateral lower extremities Sensation intact over lower and upper extremities 5/5 strength in upper extremities     ED Course/Procedures     Procedures  MDM   MRI thoracic spine ordered.  Patient is awaiting her thoracic spine MRI at the time of patient handoff. The rest of patient care will be performed by Dr. 01/07/2021. Please see their documented for further workup details.       Birt Reinoso, Bebe Shaggy, MD 01/16/21 2354    03/18/21, MD 01/17/21 [phone]

## 2021-01-16 NOTE — ED Provider Notes (Signed)
[facility] EMERGENCY DEPT Provider Note   CSN: [id number] Arrival date & time: 01/16/21  1613     History Chief Complaint  Patient presents with  . Abdominal Pain    Stacey Kaiser is a 50 y.o. female.  Patient presents with upper back pain.  Describes it as a sharp and severe 10 of 10.  States that it radiates across her bilateral chest.  She describes it as a burning type sensation.  She says she has had upper back pain off and on since January.  Describes it as more moderate intensity at the time.  She had been evaluated by telehealth a few times and tried various medications and muscle relaxants without relief.  She says she has had acute worsening of this pain within the last week.  Denies any fall or trauma.  She was seen at a outside facility a few days ago and prescribed gabapentin which she states has not been helping her.        History reviewed. No pertinent past medical history.  There are no problems to display for this patient.   History reviewed. No pertinent surgical history.   OB History   No obstetric history on file.     Family History  Problem Relation Age of Onset  . Heart failure Mother   . Chronic Renal Failure Mother   . Diabetes Mother     Social History   Tobacco Use  . Smoking status: Never Smoker  . Smokeless tobacco: Never Used  Vaping Use  . Vaping Use: Never used  Substance Use Topics  . Alcohol use: Never  . Drug use: No    Home Medications Prior to Admission medications   Medication Sig Start Date End Date Taking? Authorizing Provider  cyclobenzaprine (FLEXERIL) 5 MG tablet Take 1 tablet (5 mg total) by mouth 3 (three) times daily as needed for muscle spasms. 12/22/16   Burgess Amor, PA-C  gabapentin (NEURONTIN) 100 MG capsule Take 1 capsule (100 mg total) by mouth 3 (three) times daily. 01/11/21   Fayrene Helper, PA-C    Allergies    Other  Review of Systems   Review of Systems  Constitutional:  Negative for fever.  HENT: Negative for ear pain.   Eyes: Negative for pain.  Respiratory: Negative for cough.   Cardiovascular: Negative for chest pain.  Gastrointestinal: Negative for abdominal pain.  Genitourinary: Negative for flank pain.  Musculoskeletal: Positive for back pain.  Skin: Negative for rash.  Neurological: Negative for headaches.    Physical Exam Updated Vital Signs BP (!) 166/58   Pulse 73   Temp 98.6 F (37 C) (Oral)   Resp 20   Ht 4\' 10"  (1.473 m)   Wt 124.7 kg   LMP 01/05/2021   SpO2 99%   BMI 57.48 kg/m   Physical Exam Constitutional:      General: She is not in acute distress.    Appearance: Normal appearance.  HENT:     Head: Normocephalic.     Nose: Nose normal.  Eyes:     Extraocular Movements: Extraocular movements intact.  Cardiovascular:     Rate and Rhythm: Normal rate.  Pulmonary:     Effort: Pulmonary effort is normal.  Abdominal:     Comments: Obese abdomen, diffuse tenderness in the epigastric/periumbilical region.  Appears nonfocal, difficult exam.  Musculoskeletal:        General: Normal range of motion.     Cervical back: Normal range of motion.  Comments: No C-spine tenderness.  Diffuse T5-T12 tenderness.  No step-offs noted.  No L-spine tenderness lower down noted.  Neurological:     General: No focal deficit present.     Mental Status: She is alert and oriented to person, place, and time. Mental status is at baseline.     Cranial Nerves: No cranial nerve deficit.     Motor: No weakness.     Comments: Moving all extremities 5/5 strength.     ED Results / Procedures / Treatments   Labs (all labs ordered are listed, but only abnormal results are displayed) Labs Reviewed  CBC WITH DIFFERENTIAL/PLATELET - Abnormal; Notable for the following components:      Result Value   WBC 11.3 (*)    Platelets 404 (*)    Neutro Abs 8.7 (*)    All other components within normal limits  COMPREHENSIVE METABOLIC PANEL - Abnormal;  Notable for the following components:   Glucose, Bld 114 (*)    AST 12 (*)    All other components within normal limits  LIPASE, BLOOD  URINALYSIS, ROUTINE W REFLEX MICROSCOPIC  TROPONIN I (HIGH SENSITIVITY)    EKG None  Radiology CT Angio Chest Aorta w/CM &/OR wo/CM  Result Date: 01/16/2021 CLINICAL DATA:  Upper back pain that radiates. EXAM: CT ANGIOGRAPHY CHEST CT ABDOMEN AND PELVIS WITH CONTRAST TECHNIQUE: Multidetector CT imaging of the chest was performed using the standard protocol during bolus administration of intravenous contrast. Multiplanar CT image reconstructions and MIPs were obtained to evaluate the vascular anatomy. Multidetector CT imaging of the abdomen and pelvis was performed using the standard protocol during bolus administration of intravenous contrast. CONTRAST:  OMNIPAQUE IOHEXOL 350 MG/ML SOLN COMPARISON:  None. FINDINGS: CTA CHEST FINDINGS Cardiovascular: Noncontrast imaging demonstrates no intramural hematoma within the thoracic aorta. Contrast enhanced imaging demonstrates no aortic dissection or aneurysm. Great vessels are normal. No pericardial fluid. The main pulmonary artery is prominent at 40 mm. No filling defects within the pulmonary arteries identified. Mediastinum/Nodes: No axillary or supraclavicular adenopathy. No mediastinal or hilar adenopathy. No pericardial fluid. Esophagus normal. Lungs/Pleura: low lung volumes. Small bilateral pleural effusions. Bibasilar passive atelectasis. There is interlobular septal thickening consistent with interstitial edema. No pneumothorax. No pneumonia or infarction. Musculoskeletal: There is a compression deformity superior endplate of the T9 vertebral body with focal kyphosis. There is loss of cortical definition at this level. Additionally there is soft tissue thickening in the paravertebral space at this level (image 88/3 axial and sagittal image 96/series 7). Review of the MIP images confirms the above findings. CT  ABDOMEN and PELVIS FINDINGS Hepatobiliary: No focal hepatic lesion. No biliary duct dilatation. Common bile duct is normal. Pancreas: Pancreas is normal. No ductal dilatation. No pancreatic inflammation. Spleen: Normal spleen Adrenals/urinary tract: Adrenal glands and kidneys are normal. The ureters and bladder normal. Stomach/Bowel: Stomach, small-bowel and cecum are normal. The appendix is not identified but there is no pericecal inflammation to suggest appendicitis. The colon and rectosigmoid colon are normal. Vascular/Lymphatic: Abdominal aorta is normal caliber. No periportal or retroperitoneal adenopathy. No pelvic adenopathy. Reproductive: Uterus and ovaries normal. Other: No free fluid. Musculoskeletal: No aggressive osseous lesion. Review of the MIP images confirms the above findings. IMPRESSION: Chest Impression: 1. Loss of superior endplate height at T9 with associated inflammatory process in the perivertebral soft tissue. Findings concerning for discitis - osteomyelitis. RECOMMEND MRI of the thoracic spine without with contrast. 2. No evidence of aortic dissection or aneurysm. 3. Prominent main pulmonary artery suggest  pulmonary hypertension. 4. Low lung volumes Abdomen / Pelvis Impression: 1. No acute findings in the abdomen pelvis. Findings conveyed toJOSHUA Nalu [place] on 01/16/2021  at19:02. Electronically Signed   By: Genevive Bi M.D.   On: 01/16/2021 19:02    Procedures Procedures   Medications Ordered in ED Medications  morphine 4 MG/ML injection 4 mg (4 mg Intravenous Given 01/16/21 1645)  ondansetron (ZOFRAN) injection 4 mg (4 mg Intravenous Given 01/16/21 1643)  HYDROmorphone (DILAUDID) injection 1 mg (1 mg Intravenous Given 01/16/21 1735)  iohexol (OMNIPAQUE) 350 MG/ML injection 100 mL (100 mLs Intravenous Contrast Given 01/16/21 1747)    ED Course  I have reviewed the triage vital signs and the nursing notes.  Pertinent labs & imaging results that were available during my care of the  patient were reviewed by me and considered in my medical decision making (see chart for details).    MDM Rules/Calculators/A&P                          Is unremarkable lipase normal chemistry normal troponin negative.  EKG shows sinus rhythm no ST elevations depressions no T wave inversions noted.  Patient's exam is difficult, appears nonfocal.  No skin lesions noted no evidence of shingles noted.  Broad scan ordered as the patient states the pain is acutely worse and is huffing in pain and describing as 10 out of 10.  Imaging concerning for T9 fracture.  Case discussed with radiologist, concern for discitis recommending MRI T spine with and without contrast.  Patient transferred to [facility] for MRI of T-spine.   Final Clinical Impression(s) / ED Diagnoses Final diagnoses:  Compression fracture of body of thoracic vertebra [facility])    Rx / DC Orders ED Discharge Orders    None       Cheryll Cockayne, MD 01/16/21 1909

## 2021-01-16 NOTE — ED Notes (Signed)
Pt arrives to ED BIB [facility] as a Transfer from [facility] for an MRI. Pt resting, Vital Signs obtained. Pt A/O x4. No new orders at this time.

## 2021-01-16 NOTE — ED Notes (Signed)
Patient called for vitals in the lobby. No answerx1
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
## 2021-01-16 NOTE — ED Notes (Signed)
Vital signs stable. 

## 2021-01-17 ENCOUNTER — Emergency Department (HOSPITAL_COMMUNITY): Payer: Self-pay

## 2021-01-17 ENCOUNTER — Inpatient Hospital Stay: Payer: Self-pay

## 2021-01-17 ENCOUNTER — Encounter (HOSPITAL_COMMUNITY): Payer: Self-pay | Admitting: Internal Medicine

## 2021-01-17 DIAGNOSIS — M4640 Discitis, unspecified, site unspecified: Secondary | ICD-10-CM

## 2021-01-17 DIAGNOSIS — Z6841 Body Mass Index (BMI) 40.0 and over, adult: Secondary | ICD-10-CM

## 2021-01-17 DIAGNOSIS — M4644 Discitis, unspecified, thoracic region: Principal | ICD-10-CM

## 2021-01-17 DIAGNOSIS — M4624 Osteomyelitis of vertebra, thoracic region: Secondary | ICD-10-CM | POA: Diagnosis present

## 2021-01-17 HISTORY — DX: Body Mass Index (BMI) 40.0 and over, adult: Z684

## 2021-01-17 HISTORY — DX: Discitis, unspecified, site unspecified: M46.40

## 2021-01-17 HISTORY — DX: Morbid (severe) obesity due to excess calories: E66.01

## 2021-01-17 LAB — PROTIME-INR
INR: 1 (ref 0.8–1.2)
Prothrombin Time: 13.2 seconds (ref 11.4–15.2)

## 2021-01-17 LAB — TSH: TSH: 7.737 u[IU]/mL — ABNORMAL HIGH (ref 0.350–4.500)

## 2021-01-17 LAB — HEMOGLOBIN A1C
Hgb A1c MFr Bld: 5.7 % — ABNORMAL HIGH (ref 4.8–5.6)
Mean Plasma Glucose: 116.89 mg/dL

## 2021-01-17 LAB — C-REACTIVE PROTEIN: CRP: 8.9 mg/dL — ABNORMAL HIGH (ref <1.0)

## 2021-01-17 LAB — SEDIMENTATION RATE: Sed Rate: 43 mm/hr — ABNORMAL HIGH (ref 0–22)

## 2021-01-17 IMAGING — DX DG CHEST 1V PORT
1 series · 1 of 1 positions shown · non-contrast
Comparison: [DATE]

CLINICAL DATA: Pain

EXAM:
PORTABLE CHEST 1 VIEW

[chest]
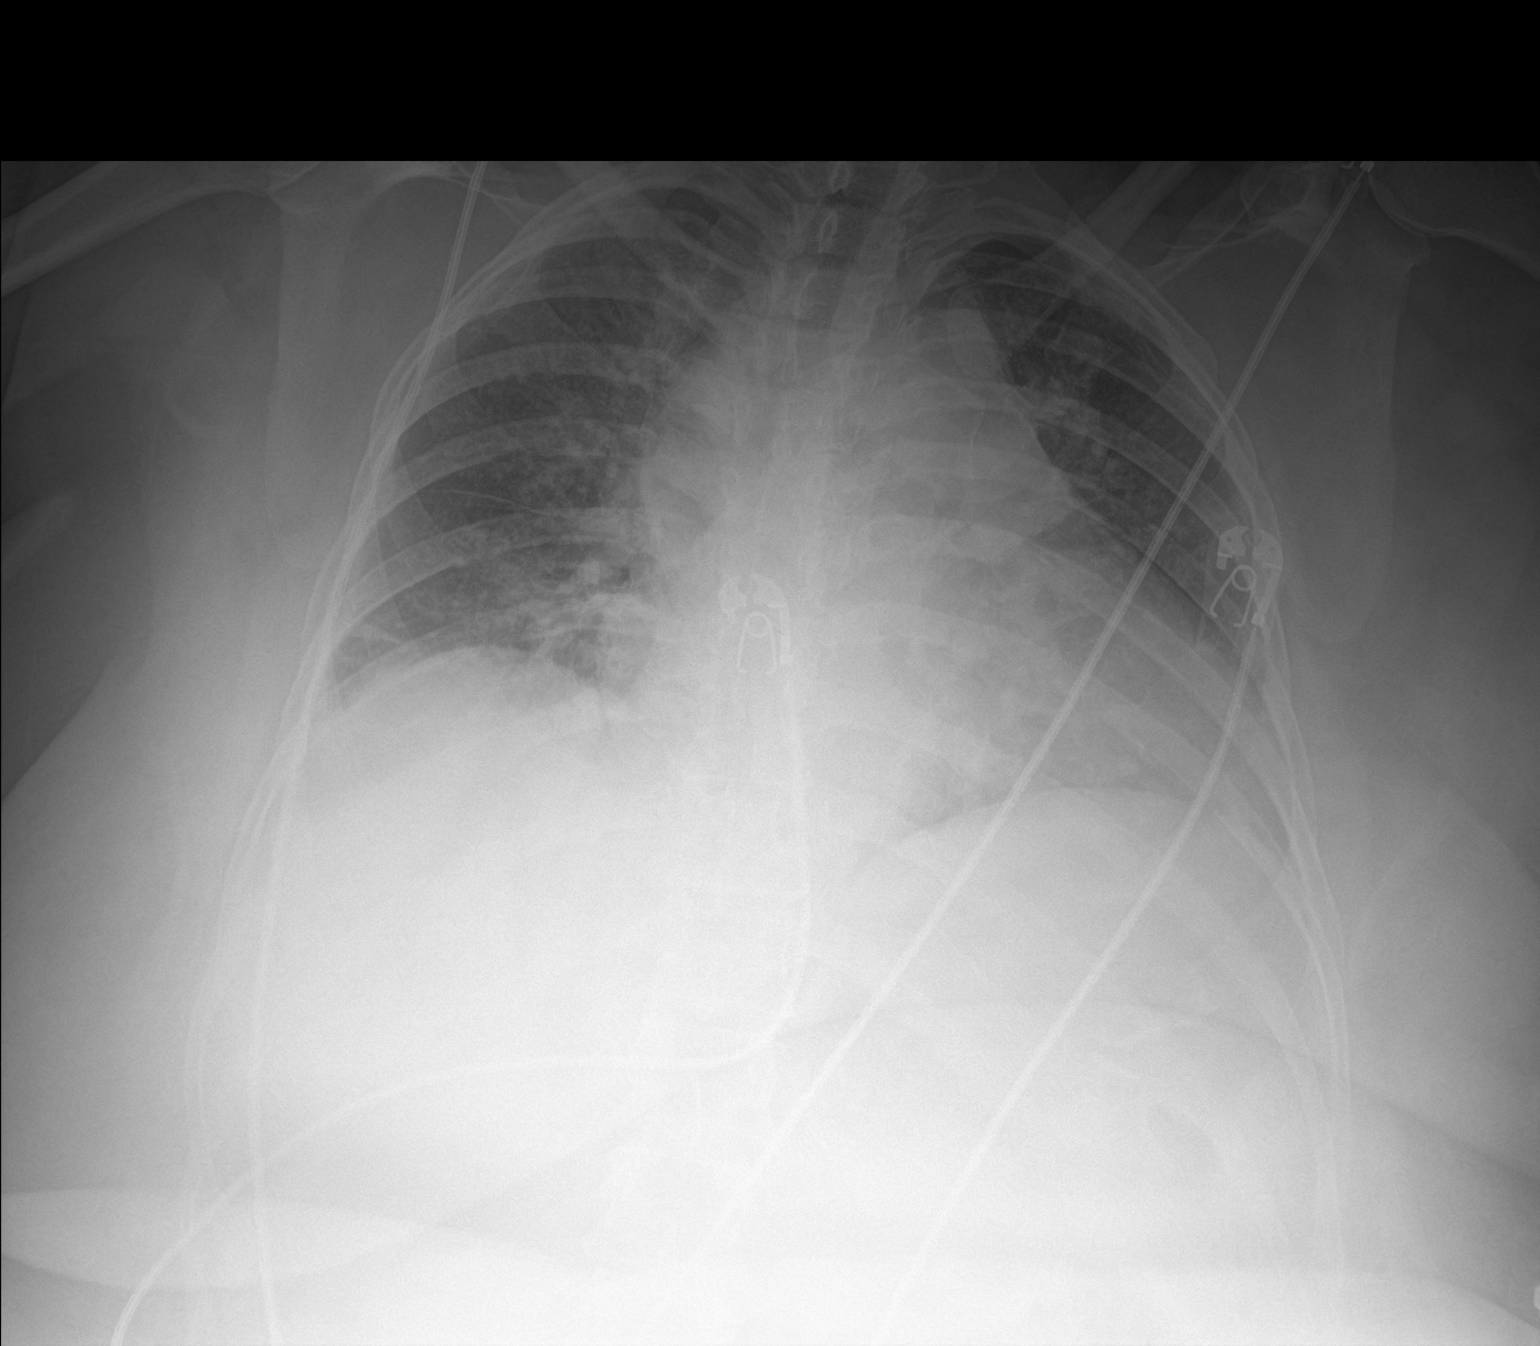

[1 of 1 positions shown; findings below may reference images not displayed]

FINDINGS: The heart size is enlarged. There are areas of atelectasis at the
lung bases. There is vascular congestion. There are probable small
bilateral pleural effusions. There is no pneumothorax.
IMPRESSION: Cardiomegaly with vascular congestion and probable small bilateral
pleural effusions.

## 2021-01-17 IMAGING — CT CT ABD-PELV W/O CM
4 of 5 series · 10 of 46 positions shown, 17 images · non-contrast
Comparison: CT abdomen/pelvis yesterday [DATE]; MRI thoracic
spine earlier today

CLINICAL DATA: 49-year-old female with abdominal distension

EXAM:
CT ABDOMEN AND PELVIS WITHOUT CONTRAST
TECHNIQUE: Multidetector CT imaging of the abdomen and pelvis was performed
following the standard protocol without IV contrast.

[Series 3: ap without · axial · non-contrast · 0.78mm/px · z∈[+1004,+1304]mm · 5 of 91 slices shown, 10 images (1 of 2)]
[im 16/91  soft-tissue]
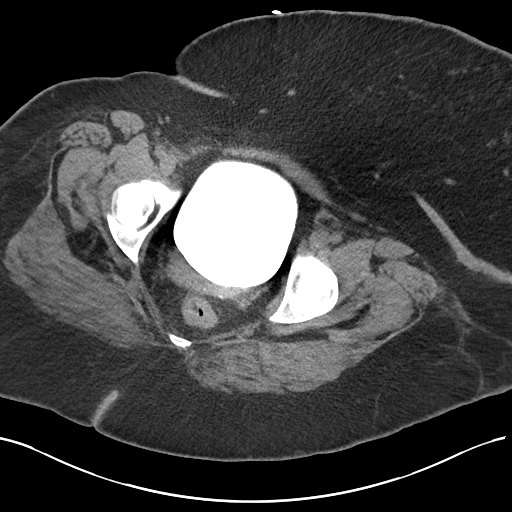
[im 16/91  bone]
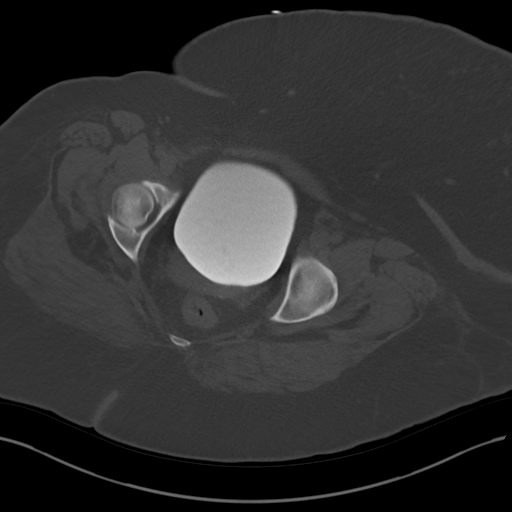
[im 31/91  soft-tissue]
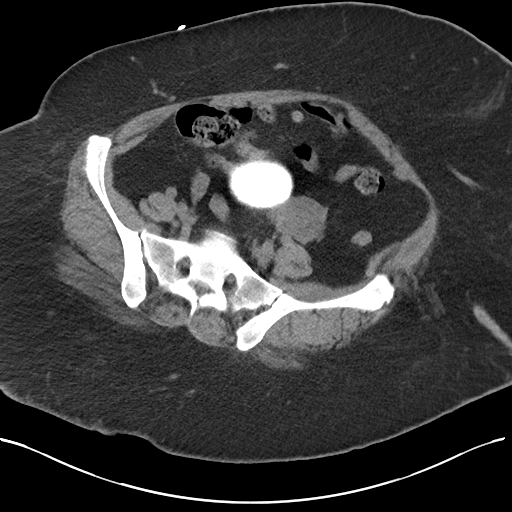
[im 31/91  lung]
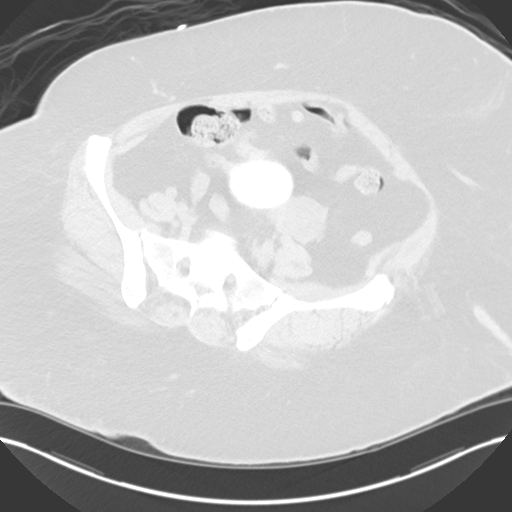
[im 46/91  soft-tissue]
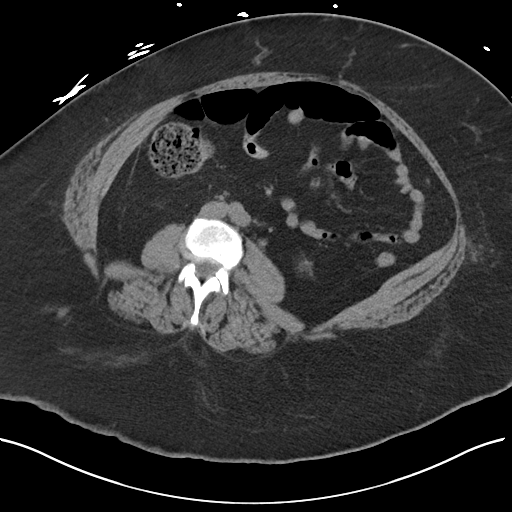
[im 46/91  lung]
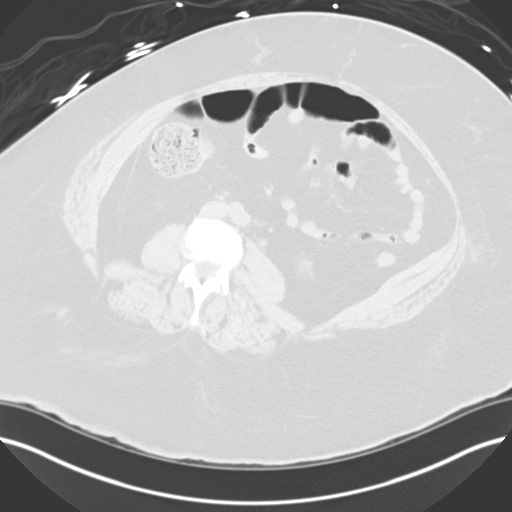
[im 61/91  soft-tissue]
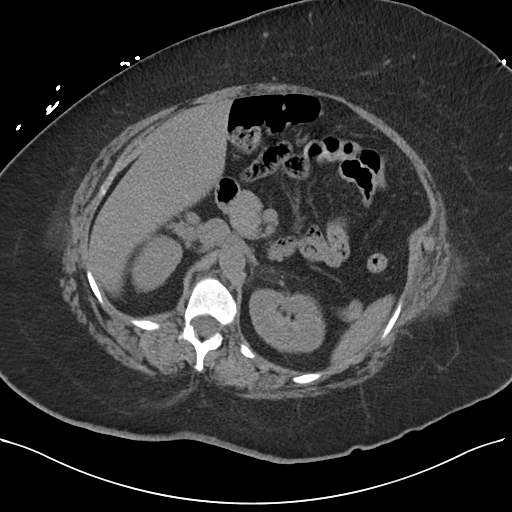
[im 61/91  lung]
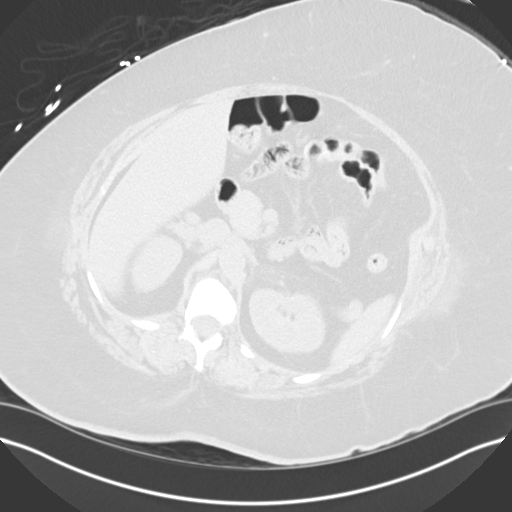
[im 76/91  soft-tissue]
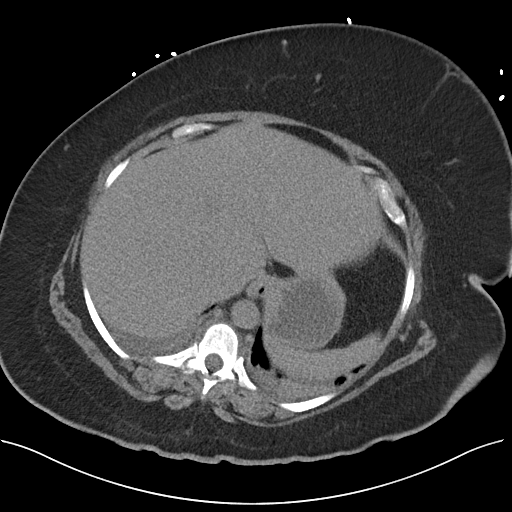
[im 76/91  lung]
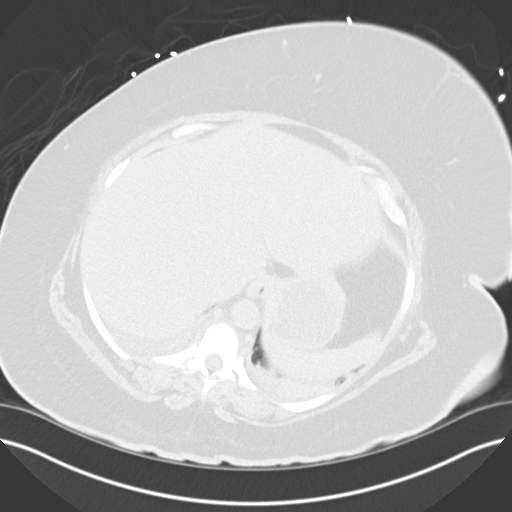

[Series 6: cor · coronal · 0.90mm/px · 3 of 134 slices shown, 4 images]
[im 45/134  soft-tissue]
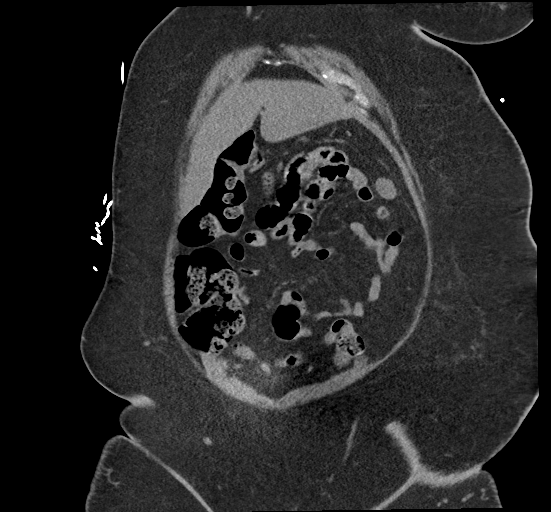
[im 60/134  soft-tissue]
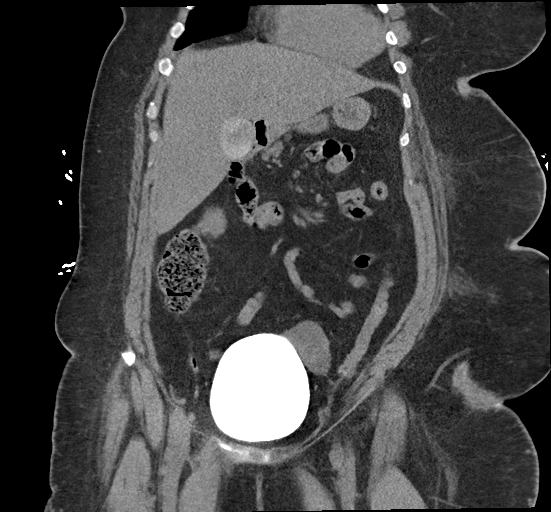
[im 60/134  bone]
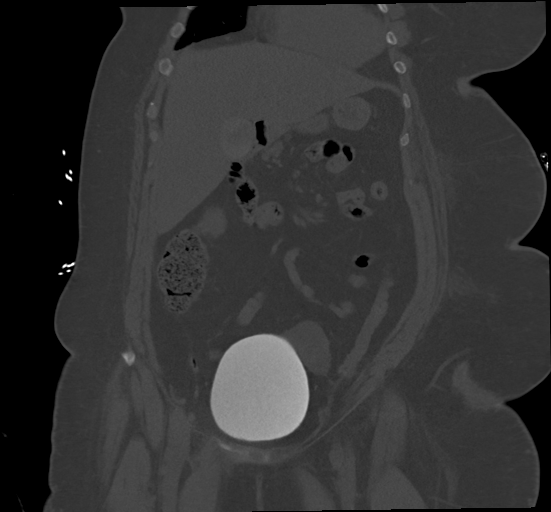
[im 74/134  soft-tissue]
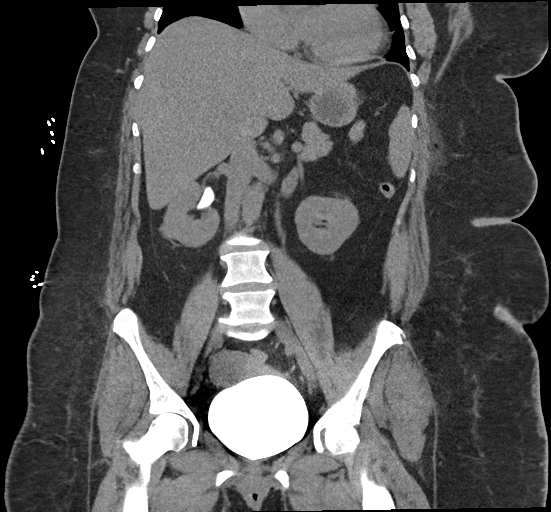

[Series 7: sag · sagittal · 0.78mm/px · 1 of 162 slices shown, 2 images]
[im 54/162  soft-tissue]
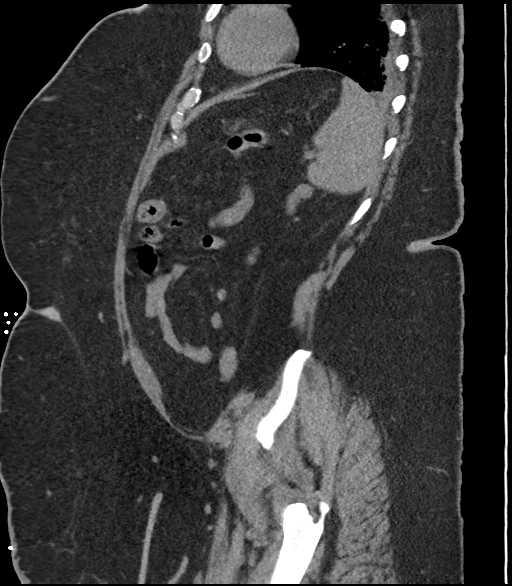
[im 54/162  bone]
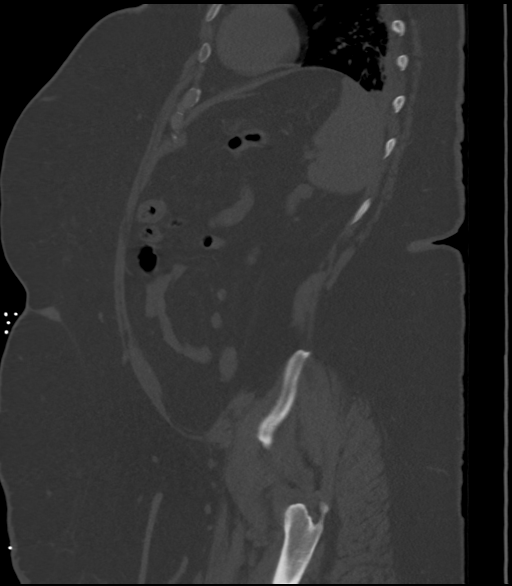

[Series 8: ap without · axial · non-contrast · 0.78mm/px · 1 of 92 slices shown (2 of 2)]
[im 16/92  soft-tissue]
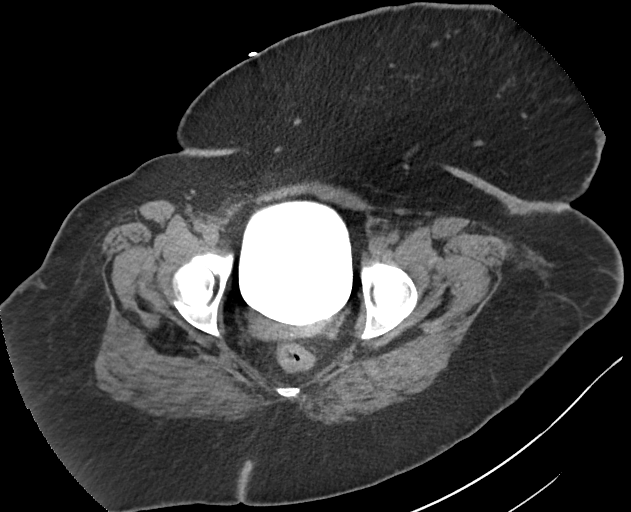

[10 of 46 positions shown; findings below may reference images not displayed]

FINDINGS: Lower chest: Trace bilateral pleural effusions with associated lower
lobe atelectasis. Bronchial wall thickening present in both lower
lobes. Stable heart size. No pericardial effusion. Unremarkable
distal thoracic esophagus.

Hepatobiliary: Normal hepatic contour and morphology. No discrete
hepatic lesion. High attenuation material present within the
gallbladder consistent with vicarious excretion of contrast. No
intra or extrahepatic biliary ductal dilatation.

Pancreas: Unremarkable. No pancreatic ductal dilatation or
surrounding inflammatory changes.

Spleen: Normal in size without focal abnormality.

Adrenals/Urinary Tract: Normal adrenal glands. Normal renal size and
morphology without exophytic mass, hydronephrosis or
nephrolithiasis. Excreted contrast material present within the
calices and urinary bladder. The ureters are unremarkable. No
evidence of bladder mass, stone or other filling defect.

Stomach/Bowel: No evidence of obstruction or focal bowel wall
thickening. Normal appendix in the right lower quadrant. The
terminal ileum is unremarkable.

Vascular/Lymphatic: Limited evaluation in the absence of intravenous
contrast. No significant atherosclerotic plaque, or aneurysm. No
suspicious lymphadenopathy.

Reproductive: Unremarkable uterus and right adnexa. Low-attenuation
cystic structures arising from the left ovary demonstrate no change
compared to yesterday. In a reproductive age female, findings remain
consistent with benign ovarian cysts.

Other: No abdominal wall hernia or abnormality. No abdominopelvic
ascites.

Musculoskeletal: Destructive changes at T8-T9 with erosion of the
inferior endplate of T8 in the superior endplate of T9. Associated
focal exaggerated thoracic kyphosis. Paraspinal phlegmonous changes
are also again noted. There has been no significant interval
progression in the short interval since prior imaging.
IMPRESSION: 1. Imaging findings consistent with osteomyelitis/discitis at T8-T9
again demonstrated.
2. No acute abnormality or interval change in the abdomen or pelvis.

## 2021-01-17 MED ORDER — TIZANIDINE HCL 2 MG PO TABS
1.0000 mg | ORAL_TABLET | Freq: Four times a day (QID) | ORAL | Status: DC | PRN
  Administered 2021-01-18: 2 mg via ORAL
  Filled 2021-01-17: qty 4
  Filled 2021-01-17 (×2): qty 2

## 2021-01-17 MED ORDER — ONDANSETRON HCL 4 MG/2ML IJ SOLN: 4.0000 mg | Freq: Four times a day (QID) | INTRAMUSCULAR | Status: DC | PRN

## 2021-01-17 MED ORDER — ALBUTEROL SULFATE (2.5 MG/3ML) 0.083% IN NEBU: 3.0000 mL | INHALATION_SOLUTION | RESPIRATORY_TRACT | Status: DC | PRN

## 2021-01-17 MED ORDER — GABAPENTIN 100 MG PO CAPS
100.0000 mg | ORAL_CAPSULE | Freq: Three times a day (TID) | ORAL | Status: DC
  Administered 2021-01-17 (×3): 100 mg via ORAL
  Filled 2021-01-17 (×3): qty 1

## 2021-01-17 MED ORDER — BISACODYL 5 MG PO TBEC: 5.0000 mg | DELAYED_RELEASE_TABLET | Freq: Every day | ORAL | Status: DC | PRN

## 2021-01-17 MED ORDER — FENTANYL CITRATE (PF) 100 MCG/2ML IJ SOLN
100.0000 ug | Freq: Once | INTRAMUSCULAR | Status: AC
Start: 2021-01-17 — End: 2021-01-17
  Administered 2021-01-17: 100 ug via INTRAVENOUS
  Filled 2021-01-17: qty 2

## 2021-01-17 MED ORDER — DOCUSATE SODIUM 100 MG PO CAPS
100.0000 mg | ORAL_CAPSULE | Freq: Two times a day (BID) | ORAL | Status: DC
  Administered 2021-01-17 – 2021-01-23 (×6): 100 mg via ORAL
  Filled 2021-01-17 (×14): qty 1

## 2021-01-17 MED ORDER — VANCOMYCIN HCL 10 G IV SOLR
2500.0000 mg | Freq: Once | INTRAVENOUS | Status: AC
  Administered 2021-01-17: 2500 mg via INTRAVENOUS
  Filled 2021-01-17: qty 2500

## 2021-01-17 MED ORDER — SODIUM CHLORIDE 0.9% FLUSH
3.0000 mL | Freq: Two times a day (BID) | INTRAVENOUS | Status: DC
  Administered 2021-01-17 – 2021-01-22 (×11): 3 mL via INTRAVENOUS

## 2021-01-17 MED ORDER — ACETAMINOPHEN 650 MG RE SUPP: 650.0000 mg | Freq: Four times a day (QID) | RECTAL | Status: DC | PRN

## 2021-01-17 MED ORDER — GADOBUTROL 1 MMOL/ML IV SOLN
10.0000 mL | Freq: Once | INTRAVENOUS | Status: AC | PRN
  Administered 2021-01-17: 10 mL via INTRAVENOUS

## 2021-01-17 MED ORDER — HYDRALAZINE HCL 20 MG/ML IJ SOLN: 5.0000 mg | INTRAMUSCULAR | Status: DC | PRN

## 2021-01-17 MED ORDER — ACETAMINOPHEN 325 MG PO TABS: 650.0000 mg | ORAL_TABLET | Freq: Four times a day (QID) | ORAL | Status: DC | PRN

## 2021-01-17 MED ORDER — ONDANSETRON HCL 4 MG PO TABS: 4.0000 mg | ORAL_TABLET | Freq: Four times a day (QID) | ORAL | Status: DC | PRN

## 2021-01-17 MED ORDER — HYDROMORPHONE HCL 1 MG/ML IJ SOLN
0.5000 mg | Freq: Once | INTRAMUSCULAR | Status: AC
  Administered 2021-01-17: 0.5 mg via INTRAVENOUS
  Filled 2021-01-17: qty 1

## 2021-01-17 MED ORDER — POLYETHYLENE GLYCOL 3350 17 G PO PACK: 17.0000 g | PACK | Freq: Every day | ORAL | Status: DC | PRN

## 2021-01-17 MED ORDER — HYDROCODONE-ACETAMINOPHEN 5-325 MG PO TABS
1.0000 | ORAL_TABLET | ORAL | Status: DC | PRN
  Administered 2021-01-17: 1 via ORAL
  Filled 2021-01-17: qty 1

## 2021-01-17 NOTE — ED Provider Notes (Signed)
Patient denies IVDA.  Denies known history of diabetes.  Denies any recent surgeries or dental procedures.  No history of back surgery  Discussed with neurosurgery on-call APP.  Plan will be to admit to hospitalist service, infectious disease consult.  No abscess to drain is noted on MRI imaging.  On-call APP for neurosurgery recommend starting vancomycin We will await CT abdomen pelvis then admit to the hospitalist.  We will start with IV vancomycin  .Critical Care E&M Performed by: Zadie Rhine, MD  Critical care provider statement:    Critical care time (minutes):  60   Critical care start time:  01/17/2021 4:00 AM   Critical care end time:  01/17/2021 5:00 AM   Critical care time was exclusive of:  Separately billable procedures and treating other patients   Critical care was necessary to treat or prevent imminent or life-threatening deterioration of the following conditions:  Sepsis, shock and CNS failure or compromise   Critical care was time spent personally by me on the following activities:  Pulse oximetry, re-evaluation of patient's condition, review of old charts, ordering and review of radiographic studies, ordering and performing treatments and interventions, examination of patient, discussions with consultants, ordering and review of laboratory studies and evaluation of patient's response to treatment   I assumed direction of critical care for this patient from another provider in my specialty: no     Care discussed with: admitting provider   After initial E/M assessment, critical care services were subsequently performed that were exclusive of separately billable procedures or treatment.    5:44 AM CT scan negative.  I discussed the case with Dr. Toniann Fail for admission.  Patient was updated on plan    Zadie Rhine, MD 01/17/21 [phone]

## 2021-01-17 NOTE — ED Notes (Signed)
Patient transported to CT 

## 2021-01-17 NOTE — Consult Note (Signed)
Chief Complaint: Patient was seen in consultation today for T8-9 discitis/biopsy.  Referring Physician(s): Peggye Ley (neurosurgery)  Supervising Physician: Julieanne Cotton  Patient Status: [facility] - In-pt  History of Present Illness: Stacey Kaiser is a 50 y.o. female with a past medical history of morbid obesity and scoliosis. Of note, patient recently presented to [facility] ED 01/11/2021 with complaint of progressive back pain x months. She was discharged home on conservative management (PO Neurontin). Pain exacerbated since this visit, so patient re-presented to [facility] ED 01/16/2021 with similar complaint. She was transferred to [facility] ED for escalation of care. In ED, MR thoracic spine revealed T8-9 discitis/osteomyelitis- she was given 1 dose emperic vancomycin at that time. She was admitted for further management. Neurosurgery was consulted who recommended IR consult for possible disc aspiration.  MR thoracic spine 01/16/2021: 1. Findings consistent with acute osteomyelitis discitis and septic arthritis at T8-9. Associated compression fracture of T9 with up to 75% anterior height loss and 4 mm bony retropulsion, with 50% height loss at T8. Superimposed epidural phlegmon at this level with resultant moderate spinal stenosis and mild cord flattening, but no definite cord signal changes on this motion degraded exam. 2. Mild edema and enhancement about the adjacent T7-8 interspace, also consistent with acute osteomyelitis discitis. 3. Associated paraspinous edema and enhancement adjacent to the T8-9 interspace without discrete soft tissue abscess. 4. Small layering bilateral pleural effusions.  IR consulted by Dr. Lovell Sheehan for possible image-guided T8-9 disc aspiration. Patient awake and alert laying in bed watching TV. Husband at bedside. Complains of back pain, currently rated 3/10- states she just received pain medication which helps with pain, rates pain as 10/10  when at its worst. Denies fever, chills, chest pain, dyspnea, abdominal pain, or headache.   Past Medical History:  Diagnosis Date  . Diskitis 01/17/2021  . Family history of adverse reaction to anesthesia    " my mother had a seizure coming out of cabg surgery "  . Morbid obesity (HCC)   . Morbid obesity with BMI of 50.0-59.9, adult (HCC) 01/17/2021  . Scoliosis     Past Surgical History:  Procedure Laterality Date  . WISDOM TOOTH EXTRACTION      Allergies: Okra and Other  Medications: Prior to Admission medications   Medication Sig Start Date End Date Taking? Authorizing Provider  albuterol (VENTOLIN HFA) 108 (90 Base) MCG/ACT inhaler Inhale 2 puffs into the lungs every 4 (four) hours as needed for wheezing or shortness of breath. 01/10/21 02/09/21 Yes [provider]  Coenzyme Q10 (CO Q 10 PO) Take 1 tablet by mouth daily. gummy   Yes [provider]  Cyanocobalamin (VITAMIN B-12 PO) Take 1 tablet by mouth daily. gummy   Yes [provider]  gabapentin (NEURONTIN) 100 MG capsule Take 1 capsule (100 mg total) by mouth 3 (three) times daily. 01/11/21  Yes Fayrene Helper, PA-C  ibuprofen (ADVIL) 200 MG tablet Take 800 mg by mouth every 6 (six) hours as needed for mild pain or headache.   Yes [provider]  Multiple Vitamins-Minerals (HAIR SKIN AND NAILS FORMULA PO) Take 1 tablet by mouth daily.   Yes [provider]  Multiple Vitamins-Minerals (ONE-A-DAY WOMENS PETITES PO) Take 1 tablet by mouth daily.   Yes [provider]  Omega-3 Fatty Acids (OMEGA-3 FISH OIL PO) Take 1 capsule by mouth daily.   Yes [provider]  OVER THE COUNTER MEDICATION Take 1 tablet by mouth daily. Power c gummy  Yes [provider]  tiZANidine (ZANAFLEX) 4 MG tablet Take 1-4 mg by mouth 4 (four) times daily as needed for muscle spasms. 01/02/21  Yes [provider]     Family History  Problem Relation Age of Onset  . Heart  failure Mother   . Chronic Renal Failure Mother   . Diabetes Mother     Social History   Socioeconomic History  . Marital status: Media planner    Spouse name: Not on file  . Number of children: 0  . Years of education: Not on file  . Highest education level: Not on file  Occupational History  . Occupation: retail at [employer]  Tobacco Use  . Smoking status: Never Smoker  . Smokeless tobacco: Never Used  Vaping Use  . Vaping Use: Never used  Substance and Sexual Activity  . Alcohol use: Yes    Comment: rare  . Drug use: No  . Sexual activity: Not on file  Other Topics Concern  . Not on file  Social History Narrative  . Not on file   Social Determinants of Health   Financial Resource Strain: Not on file  Food Insecurity: Not on file  Transportation Needs: Not on file  Physical Activity: Not on file  Stress: Not on file  Social Connections: Not on file     Review of Systems: A 12 point ROS discussed and pertinent positives are indicated in the HPI above.  All other systems are negative.  Review of Systems  Constitutional: Negative for chills and fever.  Respiratory: Negative for shortness of breath and wheezing.   Cardiovascular: Negative for chest pain and palpitations.  Gastrointestinal: Negative for abdominal pain.  Musculoskeletal: Positive for back pain.  Neurological: Negative for headaches.  Psychiatric/Behavioral: Negative for behavioral problems and confusion.    Vital Signs: BP (!) 143/67 (BP Location: Left Arm)   Pulse 77   Temp 98.1 F (36.7 C) (Oral)   Resp 17   Ht  (1.473 m)   Wt 275 lb (124.7 kg)   LMP 01/05/2021   SpO2 94%   BMI 57.48 kg/m   Physical Exam Vitals and nursing note reviewed.  Constitutional:      General: She is not in acute distress.    Appearance: She is obese.  Cardiovascular:     Rate and Rhythm: Normal rate and regular rhythm.     Heart sounds: Normal heart sounds. No murmur  heard.   Pulmonary:     Effort: Pulmonary effort is normal. No respiratory distress.     Breath sounds: Normal breath sounds. No wheezing.  Skin:    General: Skin is warm and dry.  Neurological:     Mental Status: She is alert and oriented to person, place, and time.      MD Evaluation Airway: WNL Heart: WNL Abdomen: WNL Chest/ Lungs: WNL ASA  Classification: 2 Mallampati/Airway Score: Two   Imaging: CT ABDOMEN PELVIS WO CONTRAST  Result Date: 01/17/2021 CLINICAL DATA:  50 year old female with abdominal distension EXAM: CT ABDOMEN AND PELVIS WITHOUT CONTRAST TECHNIQUE: Multidetector CT imaging of the abdomen and pelvis was performed following the standard protocol without IV contrast. COMPARISON:  CT abdomen/pelvis yesterday 01/16/2021; MRI thoracic spine earlier today FINDINGS: Lower chest: Trace bilateral pleural effusions with associated lower lobe atelectasis. Bronchial wall thickening present in both lower lobes. Stable heart size. No pericardial effusion. Unremarkable distal thoracic esophagus. Hepatobiliary: Normal hepatic contour and morphology. No discrete hepatic lesion. High attenuation material present  within the gallbladder consistent with vicarious excretion of contrast. No intra or extrahepatic biliary ductal dilatation. Pancreas: Unremarkable. No pancreatic ductal dilatation or surrounding inflammatory changes. Spleen: Normal in size without focal abnormality. Adrenals/Urinary Tract: Normal adrenal glands. Normal renal size and morphology without exophytic mass, hydronephrosis or nephrolithiasis. Excreted contrast material present within the calices and urinary bladder. The ureters are unremarkable. No evidence of bladder mass, stone or other filling defect. Stomach/Bowel: No evidence of obstruction or focal bowel wall thickening. Normal appendix in the right lower quadrant. The terminal ileum is unremarkable. Vascular/Lymphatic: Limited evaluation in the absence of  intravenous contrast. No significant atherosclerotic plaque, or aneurysm. No suspicious lymphadenopathy. Reproductive: Unremarkable uterus and right adnexa. Low-attenuation cystic structures arising from the left ovary demonstrate no change compared to yesterday. In a reproductive age female, findings remain consistent with benign ovarian cysts. Other: No abdominal wall hernia or abnormality. No abdominopelvic ascites. Musculoskeletal: Destructive changes at T8-T9 with erosion of the inferior endplate of T8 in the superior endplate of T9. Associated focal exaggerated thoracic kyphosis. Paraspinal phlegmonous changes are also again noted. There has been no significant interval progression in the short interval since prior imaging. IMPRESSION: 1. Imaging findings consistent with osteomyelitis/discitis at T8-T9 again demonstrated. 2. No acute abnormality or interval change in the abdomen or pelvis. Electronically Signed   By: Malachy MoanHeath  McCullough M.D.   On: 01/17/2021 05:22   MR THORACIC SPINE W WO CONTRAST  Result Date: 01/17/2021 CLINICAL DATA:  Initial evaluation for compression fracture. EXAM: MRI THORACIC WITHOUT AND WITH CONTRAST TECHNIQUE: Multiplanar and multiecho pulse sequences of the thoracic spine were obtained without and with intravenous contrast. CONTRAST:  10mL GADAVIST GADOBUTROL 1 MMOL/ML IV SOLN COMPARISON:  Prior CT from 01/16/2021 FINDINGS: Alignment: Slight exaggeration of the normal thoracic kyphosis, apex at T8-9. No listhesis. Vertebrae: Abnormal edema and enhancement with loss of disc space height and endplate irregularity seen about the T8-9 interspace, consistent with acute osteomyelitis discitis. Associated height loss of up to approximately 75% at the adjacent T9 vertebral body, with more moderate approximate 50% height loss at T8. Edema and enhancement extends into the T8-9 facets bilaterally, consistent with associated septic arthritis. Evidence for associated epidural phlegmon and  enhancement within both the ventral and dorsal epidural space at the level of T8-9 (series 10, image 8). No frank epidural abscess visible on this motion degraded exam. Additionally, mild marrow edema and enhancement with disc space height loss noted about the adjacent T7-8 interspace, also concerning for acute osteomyelitis discitis as well. This is more mild in appearance as compared to the adjacent T8-9 interspace. Associated paraspinous edema and enhancement adjacent to the infected T8-9 interspace without visible soft tissue abscess. No other sites of acute infection within the thoracic spine. Vertebral body height otherwise maintained. Diffusely decreased T1 signal intensity seen throughout the visualized bone marrow, nonspecific, but most commonly related to anemia, smoking or obesity. No worrisome osseous lesions. Cord: No convincing cord signal abnormality seen on this motion degraded exam. Paraspinal and other soft tissues: Paraspinous edema and enhancement adjacent to the T8-9 interspace, consistent with infection. No discrete soft tissue collections or abscesses. Small layering bilateral pleural effusions noted. Disc levels: T8-9: Findings consistent with osteomyelitis discitis. Associated compression deformity at the superior endplate of T9 with up to 50-60% height loss and 4 mm bony retropulsion. Epidural phlegmon and enhancement within the ventral and dorsal epidural space without definite frank abscess formation. Resultant moderate spinal stenosis with mild cord flattening. No definite cord signal changes  on this motion degraded exam. Moderate right worse than left foraminal narrowing. Additional fairly mild degenerative changes for age noted elsewhere within the thoracic spine. No other significant canal or foraminal stenosis. No other neural impingement. IMPRESSION: 1. Findings consistent with acute osteomyelitis discitis and septic arthritis at T8-9. Associated compression fracture of T9 with up  to 75% anterior height loss and 4 mm bony retropulsion, with 50% height loss at T8. Superimposed epidural phlegmon at this level with resultant moderate spinal stenosis and mild cord flattening, but no definite cord signal changes on this motion degraded exam. 2. Mild edema and enhancement about the adjacent T7-8 interspace, also consistent with acute osteomyelitis discitis. 3. Associated paraspinous edema and enhancement adjacent to the T8-9 interspace without discrete soft tissue abscess. 4. Small layering bilateral pleural effusions. Electronically Signed   By: Rise Mu M.D.   On: 01/17/2021 01:31   CT Abdomen Pelvis W Contrast  Result Date: 01/16/2021 CLINICAL DATA:  Upper back pain that radiates. EXAM: CT ANGIOGRAPHY CHEST CT ABDOMEN AND PELVIS WITH CONTRAST TECHNIQUE: Multidetector CT imaging of the chest was performed using the standard protocol during bolus administration of intravenous contrast. Multiplanar CT image reconstructions and MIPs were obtained to evaluate the vascular anatomy. Multidetector CT imaging of the abdomen and pelvis was performed using the standard protocol during bolus administration of intravenous contrast. CONTRAST:  OMNIPAQUE IOHEXOL 350 MG/ML SOLN COMPARISON:  None. FINDINGS: CTA CHEST FINDINGS Cardiovascular: Noncontrast imaging demonstrates no intramural hematoma within the thoracic aorta. Contrast enhanced imaging demonstrates no aortic dissection or aneurysm. Great vessels are normal. No pericardial fluid. The main pulmonary artery is prominent at 40 mm. No filling defects within the pulmonary arteries identified. Mediastinum/Nodes: No axillary or supraclavicular adenopathy. No mediastinal or hilar adenopathy. No pericardial fluid. Esophagus normal. Lungs/Pleura: low lung volumes. Small bilateral pleural effusions. Bibasilar passive atelectasis. There is interlobular septal thickening consistent with interstitial edema. No pneumothorax. No pneumonia or  infarction. Musculoskeletal: There is a compression deformity superior endplate of the T9 vertebral body with focal kyphosis. There is loss of cortical definition at this level. Additionally there is soft tissue thickening in the paravertebral space at this level (image 88/3 axial and sagittal image 96/series 7). Review of the MIP images confirms the above findings. CT ABDOMEN and PELVIS FINDINGS Hepatobiliary: No focal hepatic lesion. No biliary duct dilatation. Common bile duct is normal. Pancreas: Pancreas is normal. No ductal dilatation. No pancreatic inflammation. Spleen: Normal spleen Adrenals/urinary tract: Adrenal glands and kidneys are normal. The ureters and bladder normal. Stomach/Bowel: Stomach, small-bowel and cecum are normal. The appendix is not identified but there is no pericecal inflammation to suggest appendicitis. The colon and rectosigmoid colon are normal. Vascular/Lymphatic: Abdominal aorta is normal caliber. No periportal or retroperitoneal adenopathy. No pelvic adenopathy. Reproductive: Uterus and ovaries normal. Other: No free fluid. Musculoskeletal: No aggressive osseous lesion. Review of the MIP images confirms the above findings. IMPRESSION: Chest Impression: 1. Loss of superior endplate height at T9 with associated inflammatory process in the perivertebral soft tissue. Findings concerning for discitis - osteomyelitis. RECOMMEND MRI of the thoracic spine without with contrast. 2. No evidence of aortic dissection or aneurysm. 3. Prominent main pulmonary artery suggest pulmonary hypertension. 4. Low lung volumes Abdomen / Pelvis Impression: 1. No acute findings in the abdomen pelvis. Findings conveyed toJOSHUA HONG on 01/16/2021  at19:02. Electronically Signed   By: Genevive Bi M.D.   On: 01/16/2021 19:02   DG Chest Port 1 View  Result Date: 01/17/2021 CLINICAL  DATA:  Pain EXAM: PORTABLE CHEST 1 VIEW COMPARISON:  01/16/2021 FINDINGS: The heart size is enlarged. There are areas of  atelectasis at the lung bases. There is vascular congestion. There are probable small bilateral pleural effusions. There is no pneumothorax. IMPRESSION: Cardiomegaly with vascular congestion and probable small bilateral pleural effusions. Electronically Signed   By: Katherine Mantle M.D.   On: 01/17/2021 03:28   CT Angio Chest Aorta w/CM &/OR wo/CM  Result Date: 01/16/2021 CLINICAL DATA:  Upper back pain that radiates. EXAM: CT ANGIOGRAPHY CHEST CT ABDOMEN AND PELVIS WITH CONTRAST TECHNIQUE: Multidetector CT imaging of the chest was performed using the standard protocol during bolus administration of intravenous contrast. Multiplanar CT image reconstructions and MIPs were obtained to evaluate the vascular anatomy. Multidetector CT imaging of the abdomen and pelvis was performed using the standard protocol during bolus administration of intravenous contrast. CONTRAST:  OMNIPAQUE IOHEXOL 350 MG/ML SOLN COMPARISON:  None. FINDINGS: CTA CHEST FINDINGS Cardiovascular: Noncontrast imaging demonstrates no intramural hematoma within the thoracic aorta. Contrast enhanced imaging demonstrates no aortic dissection or aneurysm. Great vessels are normal. No pericardial fluid. The main pulmonary artery is prominent at 40 mm. No filling defects within the pulmonary arteries identified. Mediastinum/Nodes: No axillary or supraclavicular adenopathy. No mediastinal or hilar adenopathy. No pericardial fluid. Esophagus normal. Lungs/Pleura: low lung volumes. Small bilateral pleural effusions. Bibasilar passive atelectasis. There is interlobular septal thickening consistent with interstitial edema. No pneumothorax. No pneumonia or infarction. Musculoskeletal: There is a compression deformity superior endplate of the T9 vertebral body with focal kyphosis. There is loss of cortical definition at this level. Additionally there is soft tissue thickening in the paravertebral space at this level (image 88/3 axial and sagittal image  96/series 7). Review of the MIP images confirms the above findings. CT ABDOMEN and PELVIS FINDINGS Hepatobiliary: No focal hepatic lesion. No biliary duct dilatation. Common bile duct is normal. Pancreas: Pancreas is normal. No ductal dilatation. No pancreatic inflammation. Spleen: Normal spleen Adrenals/urinary tract: Adrenal glands and kidneys are normal. The ureters and bladder normal. Stomach/Bowel: Stomach, small-bowel and cecum are normal. The appendix is not identified but there is no pericecal inflammation to suggest appendicitis. The colon and rectosigmoid colon are normal. Vascular/Lymphatic: Abdominal aorta is normal caliber. No periportal or retroperitoneal adenopathy. No pelvic adenopathy. Reproductive: Uterus and ovaries normal. Other: No free fluid. Musculoskeletal: No aggressive osseous lesion. Review of the MIP images confirms the above findings. IMPRESSION: Chest Impression: 1. Loss of superior endplate height at T9 with associated inflammatory process in the perivertebral soft tissue. Findings concerning for discitis - osteomyelitis. RECOMMEND MRI of the thoracic spine without with contrast. 2. No evidence of aortic dissection or aneurysm. 3. Prominent main pulmonary artery suggest pulmonary hypertension. 4. Low lung volumes Abdomen / Pelvis Impression: 1. No acute findings in the abdomen pelvis. Findings conveyed toJOSHUA HONG on 01/16/2021  at19:02. Electronically Signed   By: Genevive Bi M.D.   On: 01/16/2021 19:02   [place] EKG SITE RITE  Result Date: 01/17/2021 If Site Rite image not attached, placement could not be confirmed due to current cardiac rhythm.   Labs:  CBC: Recent Labs    01/16/21 1631  WBC 11.3*  HGB 12.3  HCT 38.1  PLT 404*    COAGS: No results for input(s): INR, APTT in the last 8760 hours.  BMP: Recent Labs    01/16/21 1631  NA 140  K 3.9  CL 104  CO2 26  GLUCOSE 114*  BUN 9  CALCIUM  9.1  CREATININE 0.71  GFRNONAA >60    LIVER FUNCTION  TESTS: Recent Labs    01/16/21 1631  BILITOT 1.0  AST 12*  ALT 14  ALKPHOS 69  PROT 7.1  ALBUMIN 3.9     Assessment and Plan:  T8-9 discitis. Plan for image-guided T8-9 disc aspiration in IR tentatively for tomorrow 01/18/2021 with Dr. Corliss Skains pending IR scheduling. Patient did receive 1 dose emperic vancomycin this AM at 0409. Discussed with Dr. Ophelia Charter who recommends proceeding with biopsy despite abx dose x1- Dr. Corliss Skains updated. Patient will be NPO at midnight. Afebrile. She does not take blood thinners. INR 1.0 today.  Risks and benefits discussed with the patient including, but not limited to bleeding, infection, damage to adjacent structures or low yield requiring additional tests. All of the patient's questions were answered, patient is agreeable to proceed. Consent signed and in IR control room.   Thank you for this interesting consult.  I greatly enjoyed meeting [facility] and look forward to participating in their care.  A copy of this report was sent to the requesting provider on this date.  Electronically Signed: Elwin Mocha, PA-C 01/17/2021, 1:05 PM   I spent a total of 20 Minutes in face to face in clinical consultation, greater than 50% of which was counseling/coordinating care for T8-9 discitis/biopsy.

## 2021-01-17 NOTE — Consult Note (Signed)
Reason for Consult: T8-9 discitis, osteomyelitis, stenosis, thoracic spine pain Referring Physician: Dr. Toni AmendWickline  Stacey Kaiser is an 50 y.o. female.  HPI: The patient is a 50 year old obese white female who began having back pain "at the bra line" in January 2022.  She does not recall any precipitating events.  She has been seen several times.  She has failed medical management.  She got worse yesterday describing increasing pain again, "at the bra line".  She came to the ER and was worked up with a CT scan and thoracic MRI which demonstrated findings consistent with a T8-9 discitis, osteomyelitis, pathologic fracture and moderate stenosis with some epidural phlegmon.  Blood cultures were obtained.  A neurosurgical consultation was requested.  She has been started on empiric vancomycin.  Presently the patient is comfortable.  She says it does not hurt much as long as "I do not move".  She says her legs gave out yesterday but it sounds like secondary to the pain.  She has no lower extremity weakness, numbness, perineal numbness, etc.  She denies drug abuse, recent infection, etc.  History reviewed. No pertinent past medical history.  History reviewed. No pertinent surgical history.  Family History  Problem Relation Age of Onset  . Heart failure Mother   . Chronic Renal Failure Mother   . Diabetes Mother     Social History:  reports that she has never smoked. She has never used smokeless tobacco. She reports that she does not drink alcohol and does not use drugs.  Allergies:  Allergies  Allergen Reactions  . [place] Hives  . Other     Bees and wasps    Medications:  I have reviewed the patient's current medications. Prior to Admission: (Not in a hospital admission)  Scheduled:  Continuous:  VHQ:[id number]:HYDROmorphone (DILAUDID) injection Anti-infectives (From admission, onward)   Start     Dose/Rate Route Frequency Ordered Stop   01/17/21 0400  vancomycin (VANCOCIN) 2,500 mg in  sodium chloride 0.9 % 500 mL IVPB        2,500 mg 250 mL/hr over 120 Minutes Intravenous  Once 01/17/21 0350 01/17/21 0629       Results for orders placed or performed during the hospital encounter of 01/16/21 (from the past 48 hour(s))  CBC with Differential     Status: Abnormal   Collection Time: 01/16/21  4:31 PM  Result Value Ref Range   WBC 11.3 (H) 4.0 - 10.5 K/uL   RBC 4.13 3.87 - 5.11 MIL/uL   Hemoglobin 12.3 12.0 - 15.0 g/dL   HCT 44.038.1 10.236.0 - 72.546.0 %   MCV 92.3 80.0 - 100.0 fL   MCH 29.8 26.0 - 34.0 pg   MCHC 32.3 30.0 - 36.0 g/dL   RDW 36.614.3 44.011.5 - 34.715.5 %   Platelets 404 (H) 150 - 400 K/uL   nRBC 0.0 0.0 - 0.2 %   Neutrophils Relative % 77 %   Neutro Abs 8.7 (H) 1.7 - 7.7 K/uL   Lymphocytes Relative 16 %   Lymphs Abs 1.8 0.7 - 4.0 K/uL   Monocytes Relative 5 %   Monocytes Absolute 0.6 0.1 - 1.0 K/uL   Eosinophils Relative 2 %   Eosinophils Absolute 0.2 0.0 - 0.5 K/uL   Basophils Relative 0 %   Basophils Absolute 0.0 0.0 - 0.1 K/uL   Immature Granulocytes 0 %   Abs Immature Granulocytes 0.03 0.00 - 0.07 K/uL    Comment: Performed at [facility]  Comprehensive metabolic panel  Status: Abnormal   Collection Time: 01/16/21  4:31 PM  Result Value Ref Range   Sodium 140 135 - 145 mmol/L   Potassium 3.9 3.5 - 5.1 mmol/L   Chloride 104 98 - 111 mmol/L   CO2 26 22 - 32 mmol/L   Glucose, Bld 114 (H) 70 - 99 mg/dL    Comment: Glucose reference range applies only to samples taken after fasting for at least 8 hours.   BUN 9 6 - 20 mg/dL   Creatinine, Ser 3.15 0.44 - 1.00 mg/dL   Calcium 9.1 8.9 - 40.0 mg/dL   Total Protein 7.1 6.5 - 8.1 g/dL   Albumin 3.9 3.5 - 5.0 g/dL   AST 12 (L) 15 - 41 U/L   ALT 14 0 - 44 U/L   Alkaline Phosphatase 69 38 - 126 U/L   Total Bilirubin 1.0 0.3 - 1.2 mg/dL   GFR, Estimated >86 >76 mL/min    Comment: (NOTE) Calculated using the CKD-EPI Creatinine Equation (2021)    Anion gap 10 5 - 15    Comment: Performed at  [facility]  Troponin I (High Sensitivity)     Status: None   Collection Time: 01/16/21  4:31 PM  Result Value Ref Range   Troponin I (High Sensitivity) 2 <18 ng/L    Comment: (NOTE) Elevated high sensitivity troponin I (hsTnI) values and significant  changes across serial measurements may suggest ACS but many other  chronic and acute conditions are known to elevate hsTnI results.  Refer to the "Links" section for chest pain algorithms and additional  guidance. Performed at [facility]   Lipase, blood     Status: None   Collection Time: 01/16/21  4:31 PM  Result Value Ref Range   Lipase 13 11 - 51 U/L    Comment: Performed at [facility]  Resp Panel by RT-PCR (Flu A&B, Covid) Nasopharyngeal Swab     Status: None   Collection Time: 01/16/21  7:11 PM   Specimen: Nasopharyngeal Swab; Nasopharyngeal(NP) swabs in vial transport medium  Result Value Ref Range   SARS Coronavirus 2 by RT PCR NEGATIVE NEGATIVE    Comment: (NOTE) SARS-CoV-2 target nucleic acids are NOT DETECTED.  The SARS-CoV-2 RNA is generally detectable in upper respiratory specimens during the acute phase of infection. The lowest concentration of SARS-CoV-2 viral copies this assay can detect is 138 copies/mL. A negative result does not preclude SARS-Cov-2 infection and should not be used as the sole basis for treatment or other patient management decisions. A negative result may occur with  improper specimen collection/handling, submission of specimen other than nasopharyngeal swab, presence of viral mutation(s) within the areas targeted by this assay, and inadequate number of viral copies(<138 copies/mL). A negative result must be combined with clinical observations, patient history, and epidemiological information. The expected result is Negative.  Fact Sheet for Patients:  BloggerCourse.com  Fact Sheet for Healthcare Providers:   SeriousBroker.it  This test is no t yet approved or cleared by the [place] FDA and  has been authorized for detection and/or diagnosis of SARS-CoV-2 by FDA under an Emergency Use Authorization (EUA). This EUA will remain  in effect (meaning this test can be used) for the duration of the COVID-19 declaration under Section 564(b)(1) of the Act, 21 U.S.C.section 360bbb-3(b)(1), unless the authorization is terminated  or revoked sooner.       Influenza A by PCR NEGATIVE NEGATIVE   Influenza B by PCR NEGATIVE  NEGATIVE    Comment: (NOTE) The Xpert Xpress SARS-CoV-2/FLU/RSV plus assay is intended as an aid in the diagnosis of influenza from Nasopharyngeal swab specimens and should not be used as a sole basis for treatment. Nasal washings and aspirates are unacceptable for Xpert Xpress SARS-CoV-2/FLU/RSV testing.  Fact Sheet for Patients: BloggerCourse.com  Fact Sheet for Healthcare Providers: SeriousBroker.it  This test is not yet approved or cleared by the [place] FDA and has been authorized for detection and/or diagnosis of SARS-CoV-2 by FDA under an Emergency Use Authorization (EUA). This EUA will remain in effect (meaning this test can be used) for the duration of the COVID-19 declaration under Section 564(b)(1) of the Act, 21 U.S.C. section 360bbb-3(b)(1), unless the authorization is terminated or revoked.  Performed at [facility]   Sedimentation rate     Status: Abnormal   Collection Time: 01/16/21 10:45 PM  Result Value Ref Range   Sed Rate 43 (H) 0 - 22 mm/hr    Comment: Performed at [facility], [address], [place], [place] [address]  C-reactive protein     Status: Abnormal   Collection Time: 01/16/21 10:45 PM  Result Value Ref Range   CRP 8.9 (H) <1.0 mg/dL    Comment: Performed at [facility], [address] [place], [place] [address]    CT  ABDOMEN PELVIS WO CONTRAST  Result Date: 01/17/2021 CLINICAL DATA:  50 year old female with abdominal distension EXAM: CT ABDOMEN AND PELVIS WITHOUT CONTRAST TECHNIQUE: Multidetector CT imaging of the abdomen and pelvis was performed following the standard protocol without IV contrast. COMPARISON:  CT abdomen/pelvis yesterday 01/16/2021; MRI thoracic spine earlier today FINDINGS: Lower chest: Trace bilateral pleural effusions with associated lower lobe atelectasis. Bronchial wall thickening present in both lower lobes. Stable heart size. No pericardial effusion. Unremarkable distal thoracic esophagus. Hepatobiliary: Normal hepatic contour and morphology. No discrete hepatic lesion. High attenuation material present within the gallbladder consistent with vicarious excretion of contrast. No intra or extrahepatic biliary ductal dilatation. Pancreas: Unremarkable. No pancreatic ductal dilatation or surrounding inflammatory changes. Spleen: Normal in size without focal abnormality. Adrenals/Urinary Tract: Normal adrenal glands. Normal renal size and morphology without exophytic mass, hydronephrosis or nephrolithiasis. Excreted contrast material present within the calices and urinary bladder. The ureters are unremarkable. No evidence of bladder mass, stone or other filling defect. Stomach/Bowel: No evidence of obstruction or focal bowel wall thickening. Normal appendix in the right lower quadrant. The terminal ileum is unremarkable. Vascular/Lymphatic: Limited evaluation in the absence of intravenous contrast. No significant atherosclerotic plaque, or aneurysm. No suspicious lymphadenopathy. Reproductive: Unremarkable uterus and right adnexa. Low-attenuation cystic structures arising from the left ovary demonstrate no change compared to yesterday. In a reproductive age female, findings remain consistent with benign ovarian cysts. Other: No abdominal wall hernia or abnormality. No abdominopelvic ascites. Musculoskeletal:  Destructive changes at T8-T9 with erosion of the inferior endplate of T8 in the superior endplate of T9. Associated focal exaggerated thoracic kyphosis. Paraspinal phlegmonous changes are also again noted. There has been no significant interval progression in the short interval since prior imaging. IMPRESSION: 1. Imaging findings consistent with osteomyelitis/discitis at T8-T9 again demonstrated. 2. No acute abnormality or interval change in the abdomen or pelvis. Electronically Signed   By: Malachy Moan M.D.   On: 01/17/2021 05:22   MR THORACIC SPINE W WO CONTRAST  Result Date: 01/17/2021 CLINICAL DATA:  Initial evaluation for compression fracture. EXAM: MRI THORACIC WITHOUT AND WITH CONTRAST TECHNIQUE: Multiplanar and multiecho  pulse sequences of the thoracic spine were obtained without and with intravenous contrast. CONTRAST:  10mL GADAVIST GADOBUTROL 1 MMOL/ML IV SOLN COMPARISON:  Prior CT from 01/16/2021 FINDINGS: Alignment: Slight exaggeration of the normal thoracic kyphosis, apex at T8-9. No listhesis. Vertebrae: Abnormal edema and enhancement with loss of disc space height and endplate irregularity seen about the T8-9 interspace, consistent with acute osteomyelitis discitis. Associated height loss of up to approximately 75% at the adjacent T9 vertebral body, with more moderate approximate 50% height loss at T8. Edema and enhancement extends into the T8-9 facets bilaterally, consistent with associated septic arthritis. Evidence for associated epidural phlegmon and enhancement within both the ventral and dorsal epidural space at the level of T8-9 (series 10, image 8). No frank epidural abscess visible on this motion degraded exam. Additionally, mild marrow edema and enhancement with disc space height loss noted about the adjacent T7-8 interspace, also concerning for acute osteomyelitis discitis as well. This is more mild in appearance as compared to the adjacent T8-9 interspace. Associated paraspinous  edema and enhancement adjacent to the infected T8-9 interspace without visible soft tissue abscess. No other sites of acute infection within the thoracic spine. Vertebral body height otherwise maintained. Diffusely decreased T1 signal intensity seen throughout the visualized bone marrow, nonspecific, but most commonly related to anemia, smoking or obesity. No worrisome osseous lesions. Cord: No convincing cord signal abnormality seen on this motion degraded exam. Paraspinal and other soft tissues: Paraspinous edema and enhancement adjacent to the T8-9 interspace, consistent with infection. No discrete soft tissue collections or abscesses. Small layering bilateral pleural effusions noted. Disc levels: T8-9: Findings consistent with osteomyelitis discitis. Associated compression deformity at the superior endplate of T9 with up to 50-60% height loss and 4 mm bony retropulsion. Epidural phlegmon and enhancement within the ventral and dorsal epidural space without definite frank abscess formation. Resultant moderate spinal stenosis with mild cord flattening. No definite cord signal changes on this motion degraded exam. Moderate right worse than left foraminal narrowing. Additional fairly mild degenerative changes for age noted elsewhere within the thoracic spine. No other significant canal or foraminal stenosis. No other neural impingement. IMPRESSION: 1. Findings consistent with acute osteomyelitis discitis and septic arthritis at T8-9. Associated compression fracture of T9 with up to 75% anterior height loss and 4 mm bony retropulsion, with 50% height loss at T8. Superimposed epidural phlegmon at this level with resultant moderate spinal stenosis and mild cord flattening, but no definite cord signal changes on this motion degraded exam. 2. Mild edema and enhancement about the adjacent T7-8 interspace, also consistent with acute osteomyelitis discitis. 3. Associated paraspinous edema and enhancement adjacent to the T8-9  interspace without discrete soft tissue abscess. 4. Small layering bilateral pleural effusions. Electronically Signed   By: Rise Mu M.D.   On: 01/17/2021 01:31   CT Abdomen Pelvis W Contrast  Result Date: 01/16/2021 CLINICAL DATA:  Upper back pain that radiates. EXAM: CT ANGIOGRAPHY CHEST CT ABDOMEN AND PELVIS WITH CONTRAST TECHNIQUE: Multidetector CT imaging of the chest was performed using the standard protocol during bolus administration of intravenous contrast. Multiplanar CT image reconstructions and MIPs were obtained to evaluate the vascular anatomy. Multidetector CT imaging of the abdomen and pelvis was performed using the standard protocol during bolus administration of intravenous contrast. CONTRAST:  OMNIPAQUE IOHEXOL 350 MG/ML SOLN COMPARISON:  None. FINDINGS: CTA CHEST FINDINGS Cardiovascular: Noncontrast imaging demonstrates no intramural hematoma within the thoracic aorta. Contrast enhanced imaging demonstrates no aortic dissection or aneurysm. Great vessels  are normal. No pericardial fluid. The main pulmonary artery is prominent at 40 mm. No filling defects within the pulmonary arteries identified. Mediastinum/Nodes: No axillary or supraclavicular adenopathy. No mediastinal or hilar adenopathy. No pericardial fluid. Esophagus normal. Lungs/Pleura: low lung volumes. Small bilateral pleural effusions. Bibasilar passive atelectasis. There is interlobular septal thickening consistent with interstitial edema. No pneumothorax. No pneumonia or infarction. Musculoskeletal: There is a compression deformity superior endplate of the T9 vertebral body with focal kyphosis. There is loss of cortical definition at this level. Additionally there is soft tissue thickening in the paravertebral space at this level (image 88/3 axial and sagittal image 96/series 7). Review of the MIP images confirms the above findings. CT ABDOMEN and PELVIS FINDINGS Hepatobiliary: No focal hepatic lesion. No biliary  duct dilatation. Common bile duct is normal. Pancreas: Pancreas is normal. No ductal dilatation. No pancreatic inflammation. Spleen: Normal spleen Adrenals/urinary tract: Adrenal glands and kidneys are normal. The ureters and bladder normal. Stomach/Bowel: Stomach, small-bowel and cecum are normal. The appendix is not identified but there is no pericecal inflammation to suggest appendicitis. The colon and rectosigmoid colon are normal. Vascular/Lymphatic: Abdominal aorta is normal caliber. No periportal or retroperitoneal adenopathy. No pelvic adenopathy. Reproductive: Uterus and ovaries normal. Other: No free fluid. Musculoskeletal: No aggressive osseous lesion. Review of the MIP images confirms the above findings. IMPRESSION: Chest Impression: 1. Loss of superior endplate height at T9 with associated inflammatory process in the perivertebral soft tissue. Findings concerning for discitis - osteomyelitis. RECOMMEND MRI of the thoracic spine without with contrast. 2. No evidence of aortic dissection or aneurysm. 3. Prominent main pulmonary artery suggest pulmonary hypertension. 4. Low lung volumes Abdomen / Pelvis Impression: 1. No acute findings in the abdomen pelvis. Findings conveyed toJOSHUA HONG on 01/16/2021  at19:02. Electronically Signed   By: Genevive Bi M.D.   On: 01/16/2021 19:02   DG Chest Port 1 View  Result Date: 01/17/2021 CLINICAL DATA:  Pain EXAM: PORTABLE CHEST 1 VIEW COMPARISON:  01/16/2021 FINDINGS: The heart size is enlarged. There are areas of atelectasis at the lung bases. There is vascular congestion. There are probable small bilateral pleural effusions. There is no pneumothorax. IMPRESSION: Cardiomegaly with vascular congestion and probable small bilateral pleural effusions. Electronically Signed   By: Katherine Mantle M.D.   On: 01/17/2021 03:28   CT Angio Chest Aorta w/CM &/OR wo/CM  Result Date: 01/16/2021 CLINICAL DATA:  Upper back pain that radiates. EXAM: CT ANGIOGRAPHY CHEST  CT ABDOMEN AND PELVIS WITH CONTRAST TECHNIQUE: Multidetector CT imaging of the chest was performed using the standard protocol during bolus administration of intravenous contrast. Multiplanar CT image reconstructions and MIPs were obtained to evaluate the vascular anatomy. Multidetector CT imaging of the abdomen and pelvis was performed using the standard protocol during bolus administration of intravenous contrast. CONTRAST:  OMNIPAQUE IOHEXOL 350 MG/ML SOLN COMPARISON:  None. FINDINGS: CTA CHEST FINDINGS Cardiovascular: Noncontrast imaging demonstrates no intramural hematoma within the thoracic aorta. Contrast enhanced imaging demonstrates no aortic dissection or aneurysm. Great vessels are normal. No pericardial fluid. The main pulmonary artery is prominent at 40 mm. No filling defects within the pulmonary arteries identified. Mediastinum/Nodes: No axillary or supraclavicular adenopathy. No mediastinal or hilar adenopathy. No pericardial fluid. Esophagus normal. Lungs/Pleura: low lung volumes. Small bilateral pleural effusions. Bibasilar passive atelectasis. There is interlobular septal thickening consistent with interstitial edema. No pneumothorax. No pneumonia or infarction. Musculoskeletal: There is a compression deformity superior endplate of the T9 vertebral body with focal kyphosis. There is  loss of cortical definition at this level. Additionally there is soft tissue thickening in the paravertebral space at this level (image 88/3 axial and sagittal image 96/series 7). Review of the MIP images confirms the above findings. CT ABDOMEN and PELVIS FINDINGS Hepatobiliary: No focal hepatic lesion. No biliary duct dilatation. Common bile duct is normal. Pancreas: Pancreas is normal. No ductal dilatation. No pancreatic inflammation. Spleen: Normal spleen Adrenals/urinary tract: Adrenal glands and kidneys are normal. The ureters and bladder normal. Stomach/Bowel: Stomach, small-bowel and cecum are normal. The  appendix is not identified but there is no pericecal inflammation to suggest appendicitis. The colon and rectosigmoid colon are normal. Vascular/Lymphatic: Abdominal aorta is normal caliber. No periportal or retroperitoneal adenopathy. No pelvic adenopathy. Reproductive: Uterus and ovaries normal. Other: No free fluid. Musculoskeletal: No aggressive osseous lesion. Review of the MIP images confirms the above findings. IMPRESSION: Chest Impression: 1. Loss of superior endplate height at T9 with associated inflammatory process in the perivertebral soft tissue. Findings concerning for discitis - osteomyelitis. RECOMMEND MRI of the thoracic spine without with contrast. 2. No evidence of aortic dissection or aneurysm. 3. Prominent main pulmonary artery suggest pulmonary hypertension. 4. Low lung volumes Abdomen / Pelvis Impression: 1. No acute findings in the abdomen pelvis. Findings conveyed toJOSHUA HONG on 01/16/2021  at19:02. Electronically Signed   By: Genevive Bi M.D.   On: 01/16/2021 19:02    ROS Blood pressure 129/77, pulse 63, temperature 98.8 F (37.1 C), temperature source Oral, resp. rate 18, height  (1.473 m), weight 124.7 kg, last menstrual period 01/05/2021, SpO2 100 %. Estimated body mass index is 57.48 kg/m as calculated from the following:   Height as of this encounter:  (1.473 m).   Weight as of this encounter: 124.7 kg.  Physical Exam  General: An alert and pleasant obese 50 year old white female in no apparent distress  HEENT: Normocephalic, atraumatic, extraocular muscles intact  Neck: Unremarkable, Spurling's testing is negative  Thorax: Symmetric  Abdomen: Obese  Extremities: Obese  Back exam: She complains of pain at approximately T8-9.  Neurologic exam: The patient is alert and oriented x3.  Cranial nerves II through XII were examined bilaterally and grossly normal.  The patient's motor strength is 5/5 in bilateral handgrip, quadricep, gastrocnemius, and  dorsiflexors.  Sensory function is intact to light touch sensation in her lower extremities.  Cerebellar function is intact to rapid altering movements of the extremities bilateral.  Imaging studies: I have reviewed the patient's thoracic MRI which demonstrates findings consistent with a T8-9 discitis, osteomyelitis with moderate compression fracture and moderate spinal stenosis.  There is some epidural enhancing tissue, likely "phlegmon".  There is a mild kyphotic deformity.  There is inflammation in the perivertebral soft tissue.  Relevant labs: Her C-reactive protein is 8.9.  Her sed rate is 43.  Her white count is 11.3.  Blood cultures are pending.    Assessment/Plan: T8-9 discitis, osteomyelitis, compression fracture, spinal stenosis: I have discussed the situation with the patient.  She has been started on empiric vancomycin.  We have recommended an ID consult.  I will ask IR to aspirate the patient's perivertebral inflammatory region hopefully to isolate an organism to help US guide antibiotics.  Hopefully the antibiotics will kick in and we can avoid surgery as she would likely need instrumentation and I would prefer not to place instrumentation in the setting of an active infection..  She does not need surgery presently but surgery would needed if she becomes weak or develops  numbness.  Cristi Loron 01/17/2021, 7:20 AM

## 2021-01-17 NOTE — H&P (Addendum)
History and Physical    Stacey Kaiser WUG:[id number] DOB: 1971-06-10 DOA: 01/16/2021  PCP: Patient, No Pcp Per (Inactive) Consultants:  None Patient coming from:  Home - lives with husband; NOK: Husband, [phone]  Chief Complaint: Back pain  HPI: Stacey Kaiser is a 50 y.o. female with medical history significant of morbid obesity (BMI 57.48) presenting with back pain.  She was seen via telemedicine on 2/3 for back spasms and was given muscle relaxants.  She returned to the ER with back pain on 3/20 and was again diagnosed with back spasms and given prednisone and Zanaflex.  She was seen in Urgent Care on 3/28 for back pain and SOB and was given Albuterol and ordered to have a CXR.  CXR showed "generalized interstitial pulmonary prominence" likely atelectasis and possible viral changes.  They told her it was long-term COVID - although she is not aware of having ever had COVID.  "It just wasn't right."  She went to [facility] ER and he did not think it was long-term COVID and thought it was a back nerve problem.  He gave her gabapentin.  It just wasn't getting any better, no relief at all.  She was having difficulty walking because the tightening in her back would be too intense.  She was requiring a wheelchair to move from place to place.  Yesterday AM, the pain was different.  The only comfortable position was lying on her right side.  As long as she didn't move, it was ok.  Getting up is excruciating.  Yesterday afternoon, she felt a twist and pop when she got up and the pain was so intense that they had to call 911.  She was taken to [facility].  CT showed diskitis and compression fracture in T8.  They transferred her to [facility].  Pain medication is currently helping.  No radicular symptoms.  She did have urinary incontinence yesterday but it was because the pain was too intense to make it to the bathroom.    ED Course:  Carryover, per Dr. Hal Hope:  50 year old female presents  with back pain is found to have a T8-T9 discitis. ER physician discussed with on-call neurosurgery PA who advised that lesion can likely not be aspirated to start empiric antibiotics after blood cultures. They will be seeing in consult.  Review of Systems: As per HPI; otherwise review of systems reviewed and negative.   Ambulatory Status:  Ambulates without assistance  COVID Vaccine Status:  Complete plus booster  Past Medical History:  Diagnosis Date  . Diskitis 01/17/2021  . Family history of adverse reaction to anesthesia    " my mother had a seizure coming out of cabg surgery "  . Morbid obesity ([place])   . Morbid obesity with BMI of 50.0-59.9, adult ([place]) 01/17/2021  . Scoliosis     Past Surgical History:  Procedure Laterality Date  . WISDOM TOOTH EXTRACTION      Social History   Socioeconomic History  . Marital status: Soil scientist    Spouse name: Not on file  . Number of children: 0  . Years of education: Not on file  . Highest education level: Not on file  Occupational History  . Occupation: retail at [employer]  Tobacco Use  . Smoking status: Never Smoker  . Smokeless tobacco: Never Used  Vaping Use  . Vaping Use: Never used  Substance and Sexual Activity  . Alcohol use: Yes    Comment: rare  . Drug use: No  . Sexual  activity: Not on file  Other Topics Concern  . Not on file  Social History Narrative  . Not on file   Social Determinants of Health   Financial Resource Strain: Not on file  Food Insecurity: Not on file  Transportation Needs: Not on file  Physical Activity: Not on file  Stress: Not on file  Social Connections: Not on file  Intimate Partner Violence: Not on file    Allergies  Allergen Reactions  . [place] Hives  . Other     Bees and wasps    Family History  Problem Relation Age of Onset  . Heart failure Mother   . Chronic Renal Failure Mother   . Diabetes Mother     Prior to Admission medications   Medication Sig  Start Date End Date Taking? Authorizing Provider  albuterol (VENTOLIN HFA) 108 (90 Base) MCG/ACT inhaler Inhale 2 puffs into the lungs every 4 (four) hours as needed for wheezing or shortness of breath. 01/10/21 02/09/21 Yes [provider]  Coenzyme Q10 (CO Q 10 PO) Take 1 tablet by mouth daily. gummy   Yes [provider]  Cyanocobalamin (VITAMIN B-12 PO) Take 1 tablet by mouth daily. gummy   Yes [provider]  gabapentin (NEURONTIN) 100 MG capsule Take 1 capsule (100 mg total) by mouth 3 (three) times daily. 01/11/21  Yes Domenic Moras, PA-C  ibuprofen (ADVIL) 200 MG tablet Take 800 mg by mouth every 6 (six) hours as needed for mild pain or headache.   Yes [provider]  Multiple Vitamins-Minerals (HAIR SKIN AND NAILS FORMULA PO) Take 1 tablet by mouth daily.   Yes [provider]  Multiple Vitamins-Minerals (ONE-A-DAY WOMENS PETITES PO) Take 1 tablet by mouth daily.   Yes [provider]  Omega-3 Fatty Acids (OMEGA-3 FISH OIL PO) Take 1 capsule by mouth daily.   Yes [provider]  OVER THE COUNTER MEDICATION Take 1 tablet by mouth daily. Power c gummy   Yes [provider]  tiZANidine (ZANAFLEX) 4 MG tablet Take 1-4 mg by mouth 4 (four) times daily as needed for muscle spasms. 01/02/21  Yes [provider]  predniSONE (DELTASONE) 20 MG tablet Take 20 mg by mouth See admin instructions. 3 tabs x 3 days, 2 tabs x 3 days, 1 tab x 3 days. 1/2 tab x 3 days. Then stop Patient not taking: Reported on 01/16/2021 01/02/21   [provider]    Physical Exam: Vitals:   01/17/21 0400 01/17/21 0514 01/17/21 0615 01/17/21 0833  BP: (!) 151/87 (!) 160/84 129/77 (!) 143/67  Pulse: 74 74 63 77  Resp: (!) 22 19 18 17   Temp:    98.1 F (36.7 C)  TempSrc:    Oral  SpO2: 98% 100% 100% 94%  Weight:      Height:         . General:  Appears calm and comfortable and is in NAD . Eyes:  PERRL, EOMI, normal lids,  iris . ENT:  grossly normal hearing, lips & tongue, mmm; suboptimal dentition . Neck:  no LAD, masses or thyromegaly . Cardiovascular:  RRR, no m/r/g. No LE edema.  Marland Kitchen Respiratory:   CTA bilaterally with no wheezes/rales/rhonchi.  Normal respiratory effort. . Abdomen:  soft, NT, ND, NABS, limited by pannus/habitus . Skin:  no rash or induration seen on limited exam . Musculoskeletal:  grossly normal tone BUE/BLE, no bony abnormality . Lower extremity:  No LE edema.  Limited foot exam with no  ulcerations.  2+ distal pulses. Marland Kitchen Psychiatric:  grossly normal mood and affect, speech fluent and appropriate, AOx3 . Neurologic:  CN 2-12 grossly intact, moves all extremities in coordinated fashion    Radiological Exams on Admission: Independently reviewed - see discussion in A/P where applicable  CT ABDOMEN PELVIS WO CONTRAST  Result Date: 01/17/2021 CLINICAL DATA:  50 year old female with abdominal distension EXAM: CT ABDOMEN AND PELVIS WITHOUT CONTRAST TECHNIQUE: Multidetector CT imaging of the abdomen and pelvis was performed following the standard protocol without IV contrast. COMPARISON:  CT abdomen/pelvis yesterday 01/16/2021; MRI thoracic spine earlier today FINDINGS: Lower chest: Trace bilateral pleural effusions with associated lower lobe atelectasis. Bronchial wall thickening present in both lower lobes. Stable heart size. No pericardial effusion. Unremarkable distal thoracic esophagus. Hepatobiliary: Normal hepatic contour and morphology. No discrete hepatic lesion. High attenuation material present within the gallbladder consistent with vicarious excretion of contrast. No intra or extrahepatic biliary ductal dilatation. Pancreas: Unremarkable. No pancreatic ductal dilatation or surrounding inflammatory changes. Spleen: Normal in size without focal abnormality. Adrenals/Urinary Tract: Normal adrenal glands. Normal renal size and morphology without exophytic mass, hydronephrosis or nephrolithiasis.  Excreted contrast material present within the calices and urinary bladder. The ureters are unremarkable. No evidence of bladder mass, stone or other filling defect. Stomach/Bowel: No evidence of obstruction or focal bowel wall thickening. Normal appendix in the right lower quadrant. The terminal ileum is unremarkable. Vascular/Lymphatic: Limited evaluation in the absence of intravenous contrast. No significant atherosclerotic plaque, or aneurysm. No suspicious lymphadenopathy. Reproductive: Unremarkable uterus and right adnexa. Low-attenuation cystic structures arising from the left ovary demonstrate no change compared to yesterday. In a reproductive age female, findings remain consistent with benign ovarian cysts. Other: No abdominal wall hernia or abnormality. No abdominopelvic ascites. Musculoskeletal: Destructive changes at T8-T9 with erosion of the inferior endplate of T8 in the superior endplate of T9. Associated focal exaggerated thoracic kyphosis. Paraspinal phlegmonous changes are also again noted. There has been no significant interval progression in the short interval since prior imaging. IMPRESSION: 1. Imaging findings consistent with osteomyelitis/discitis at T8-T9 again demonstrated. 2. No acute abnormality or interval change in the abdomen or pelvis. Electronically Signed   By: Jacqulynn Cadet M.D.   On: 01/17/2021 05:22   MR THORACIC SPINE W WO CONTRAST  Result Date: 01/17/2021 CLINICAL DATA:  Initial evaluation for compression fracture. EXAM: MRI THORACIC WITHOUT AND WITH CONTRAST TECHNIQUE: Multiplanar and multiecho pulse sequences of the thoracic spine were obtained without and with intravenous contrast. CONTRAST:  59m GADAVIST GADOBUTROL 1 MMOL/ML IV SOLN COMPARISON:  Prior CT from 01/16/2021 FINDINGS: Alignment: Slight exaggeration of the normal thoracic kyphosis, apex at T8-9. No listhesis. Vertebrae: Abnormal edema and enhancement with loss of disc space height and endplate irregularity  seen about the T8-9 interspace, consistent with acute osteomyelitis discitis. Associated height loss of up to approximately 75% at the adjacent T9 vertebral body, with more moderate approximate 50% height loss at T8. Edema and enhancement extends into the T8-9 facets bilaterally, consistent with associated septic arthritis. Evidence for associated epidural phlegmon and enhancement within both the ventral and dorsal epidural space at the level of T8-9 (series 10, image 8). No frank epidural abscess visible on this motion degraded exam. Additionally, mild marrow edema and enhancement with disc space height loss noted about the adjacent T7-8 interspace, also concerning for acute osteomyelitis discitis as well. This is more mild in appearance as compared to the adjacent T8-9 interspace. Associated paraspinous edema and enhancement adjacent to the infected T8-9  interspace without visible soft tissue abscess. No other sites of acute infection within the thoracic spine. Vertebral body height otherwise maintained. Diffusely decreased T1 signal intensity seen throughout the visualized bone marrow, nonspecific, but most commonly related to anemia, smoking or obesity. No worrisome osseous lesions. Cord: No convincing cord signal abnormality seen on this motion degraded exam. Paraspinal and other soft tissues: Paraspinous edema and enhancement adjacent to the T8-9 interspace, consistent with infection. No discrete soft tissue collections or abscesses. Small layering bilateral pleural effusions noted. Disc levels: T8-9: Findings consistent with osteomyelitis discitis. Associated compression deformity at the superior endplate of T9 with up to 50-60% height loss and 4 mm bony retropulsion. Epidural phlegmon and enhancement within the ventral and dorsal epidural space without definite frank abscess formation. Resultant moderate spinal stenosis with mild cord flattening. No definite cord signal changes on this motion degraded exam.  Moderate right worse than left foraminal narrowing. Additional fairly mild degenerative changes for age noted elsewhere within the thoracic spine. No other significant canal or foraminal stenosis. No other neural impingement. IMPRESSION: 1. Findings consistent with acute osteomyelitis discitis and septic arthritis at T8-9. Associated compression fracture of T9 with up to 75% anterior height loss and 4 mm bony retropulsion, with 50% height loss at T8. Superimposed epidural phlegmon at this level with resultant moderate spinal stenosis and mild cord flattening, but no definite cord signal changes on this motion degraded exam. 2. Mild edema and enhancement about the adjacent T7-8 interspace, also consistent with acute osteomyelitis discitis. 3. Associated paraspinous edema and enhancement adjacent to the T8-9 interspace without discrete soft tissue abscess. 4. Small layering bilateral pleural effusions. Electronically Signed   By: Jeannine Boga M.D.   On: 01/17/2021 01:31   CT Abdomen Pelvis W Contrast  Result Date: 01/16/2021 CLINICAL DATA:  Upper back pain that radiates. EXAM: CT ANGIOGRAPHY CHEST CT ABDOMEN AND PELVIS WITH CONTRAST TECHNIQUE: Multidetector CT imaging of the chest was performed using the standard protocol during bolus administration of intravenous contrast. Multiplanar CT image reconstructions and MIPs were obtained to evaluate the vascular anatomy. Multidetector CT imaging of the abdomen and pelvis was performed using the standard protocol during bolus administration of intravenous contrast. CONTRAST:  159m OMNIPAQUE IOHEXOL 350 MG/ML SOLN COMPARISON:  None. FINDINGS: CTA CHEST FINDINGS Cardiovascular: Noncontrast imaging demonstrates no intramural hematoma within the thoracic aorta. Contrast enhanced imaging demonstrates no aortic dissection or aneurysm. Great vessels are normal. No pericardial fluid. The main pulmonary artery is prominent at 40 mm. No filling defects within the  pulmonary arteries identified. Mediastinum/Nodes: No axillary or supraclavicular adenopathy. No mediastinal or hilar adenopathy. No pericardial fluid. Esophagus normal. Lungs/Pleura: low lung volumes. Small bilateral pleural effusions. Bibasilar passive atelectasis. There is interlobular septal thickening consistent with interstitial edema. No pneumothorax. No pneumonia or infarction. Musculoskeletal: There is a compression deformity superior endplate of the T9 vertebral body with focal kyphosis. There is loss of cortical definition at this level. Additionally there is soft tissue thickening in the paravertebral space at this level (image 88/3 axial and sagittal image 96/series 7). Review of the MIP images confirms the above findings. CT ABDOMEN and PELVIS FINDINGS Hepatobiliary: No focal hepatic lesion. No biliary duct dilatation. Common bile duct is normal. Pancreas: Pancreas is normal. No ductal dilatation. No pancreatic inflammation. Spleen: Normal spleen Adrenals/urinary tract: Adrenal glands and kidneys are normal. The ureters and bladder normal. Stomach/Bowel: Stomach, small-bowel and cecum are normal. The appendix is not identified but there is no pericecal inflammation to suggest appendicitis.  The colon and rectosigmoid colon are normal. Vascular/Lymphatic: Abdominal aorta is normal caliber. No periportal or retroperitoneal adenopathy. No pelvic adenopathy. Reproductive: Uterus and ovaries normal. Other: No free fluid. Musculoskeletal: No aggressive osseous lesion. Review of the MIP images confirms the above findings. IMPRESSION: Chest Impression: 1. Loss of superior endplate height at T9 with associated inflammatory process in the perivertebral soft tissue. Findings concerning for discitis - osteomyelitis. RECOMMEND MRI of the thoracic spine without with contrast. 2. No evidence of aortic dissection or aneurysm. 3. Prominent main pulmonary artery suggest pulmonary hypertension. 4. Low lung volumes Abdomen  / Pelvis Impression: 1. No acute findings in the abdomen pelvis. Findings conveyed toJOSHUA HONG on 01/16/2021  at19:02. Electronically Signed   By: Suzy Bouchard M.D.   On: 01/16/2021 19:02   DG Chest Port 1 View  Result Date: 01/17/2021 CLINICAL DATA:  Pain EXAM: PORTABLE CHEST 1 VIEW COMPARISON:  01/16/2021 FINDINGS: The heart size is enlarged. There are areas of atelectasis at the lung bases. There is vascular congestion. There are probable small bilateral pleural effusions. There is no pneumothorax. IMPRESSION: Cardiomegaly with vascular congestion and probable small bilateral pleural effusions. Electronically Signed   By: Constance Holster M.D.   On: 01/17/2021 03:28   CT Angio Chest Aorta w/CM &/OR wo/CM  Result Date: 01/16/2021 CLINICAL DATA:  Upper back pain that radiates. EXAM: CT ANGIOGRAPHY CHEST CT ABDOMEN AND PELVIS WITH CONTRAST TECHNIQUE: Multidetector CT imaging of the chest was performed using the standard protocol during bolus administration of intravenous contrast. Multiplanar CT image reconstructions and MIPs were obtained to evaluate the vascular anatomy. Multidetector CT imaging of the abdomen and pelvis was performed using the standard protocol during bolus administration of intravenous contrast. CONTRAST:  145m OMNIPAQUE IOHEXOL 350 MG/ML SOLN COMPARISON:  None. FINDINGS: CTA CHEST FINDINGS Cardiovascular: Noncontrast imaging demonstrates no intramural hematoma within the thoracic aorta. Contrast enhanced imaging demonstrates no aortic dissection or aneurysm. Great vessels are normal. No pericardial fluid. The main pulmonary artery is prominent at 40 mm. No filling defects within the pulmonary arteries identified. Mediastinum/Nodes: No axillary or supraclavicular adenopathy. No mediastinal or hilar adenopathy. No pericardial fluid. Esophagus normal. Lungs/Pleura: low lung volumes. Small bilateral pleural effusions. Bibasilar passive atelectasis. There is interlobular septal  thickening consistent with interstitial edema. No pneumothorax. No pneumonia or infarction. Musculoskeletal: There is a compression deformity superior endplate of the T9 vertebral body with focal kyphosis. There is loss of cortical definition at this level. Additionally there is soft tissue thickening in the paravertebral space at this level (image 88/3 axial and sagittal image 96/series 7). Review of the MIP images confirms the above findings. CT ABDOMEN and PELVIS FINDINGS Hepatobiliary: No focal hepatic lesion. No biliary duct dilatation. Common bile duct is normal. Pancreas: Pancreas is normal. No ductal dilatation. No pancreatic inflammation. Spleen: Normal spleen Adrenals/urinary tract: Adrenal glands and kidneys are normal. The ureters and bladder normal. Stomach/Bowel: Stomach, small-bowel and cecum are normal. The appendix is not identified but there is no pericecal inflammation to suggest appendicitis. The colon and rectosigmoid colon are normal. Vascular/Lymphatic: Abdominal aorta is normal caliber. No periportal or retroperitoneal adenopathy. No pelvic adenopathy. Reproductive: Uterus and ovaries normal. Other: No free fluid. Musculoskeletal: No aggressive osseous lesion. Review of the MIP images confirms the above findings. IMPRESSION: Chest Impression: 1. Loss of superior endplate height at T9 with associated inflammatory process in the perivertebral soft tissue. Findings concerning for discitis - osteomyelitis. RECOMMEND MRI of the thoracic spine without with contrast. 2. No evidence  of aortic dissection or aneurysm. 3. Prominent main pulmonary artery suggest pulmonary hypertension. 4. Low lung volumes Abdomen / Pelvis Impression: 1. No acute findings in the abdomen pelvis. Findings conveyed toJOSHUA HONG on 01/16/2021  at19:02. Electronically Signed   By: Suzy Bouchard M.D.   On: 01/16/2021 19:02   [place] EKG SITE RITE  Result Date: 01/17/2021 If Site Rite image not attached, placement could not be  confirmed due to current cardiac rhythm.   EKG: Independently reviewed.  NSR with rate 87; no evidence of acute ischemia   Labs on Admission: I have personally reviewed the available labs and imaging studies at the time of the admission.  Pertinent labs:   Glucose 114 HS troponin 2 CRP 8.9 ESR 43 WBC 11.3 Platelets 404 COVID/flu negative Blood cultures pending   Assessment/Plan Principal Problem:   Discitis Active Problems:   Morbid obesity with BMI of 50.0-59.9, adult ([place])   Discitis -Patient without known PMH presenting with recurrent back pain. -?Dentition as a result -MRI showed acute osteomyelitis/discitis and septic arthritis at T8-9 as well as compression fracture -Concerning lab findings would include CRP >13.5, elevated ESR (goal is < 1/2 age +14 if female); CRP is reassuring and ESR is elevated.  WBC is often <15 and is currently 11.3. -Neurosurgery consulted and recommended IR evaluation, which has been ordered. -ID consult is pending, as are blood cultures. -Will need antibiotics for 4-6 weeks - currently holding antibiotics pending I&D. -With vertebral osteo, needs testing for TB; will add quantiferon gold in addition to HIV. -Further important considerations include nutrition (will order consult), diabetes control (no h/o, will check A1c).  Morbid obesity -Body mass index is 57.48 kg/m..  -Weight loss should be encouraged -Check TSH -Outpatient PCP/bariatric medicine/bariatric surgery f/u encouraged  Poor access to medical care -Patient without PCP or insurance -Will need long-term antibiotics (PICC line ordered, but this will need to wait for negative blood cultures; team will f/u) -TOC consult for PCP assistance as well as financial assistance     Note: This patient has been tested and is negative for the novel coronavirus COVID-19. The patient has been fully vaccinated against COVID-19.   Level of care: Med-Surg DVT prophylaxis:  SCDs Code  Status:  Full - confirmed with patient Family Communication: None present Disposition Plan:  The patient is from: home  Anticipated d/c is to: home without [facility] services  Anticipated d/c date will depend on clinical response to treatment, anticipate prolonged LOS  Patient is currently: acutely ill Consults called: Neurosurgery, ID, IR; Nutrition, TOC team Admission status:  Admit - It is my clinical opinion that admission to INPATIENT is reasonable and necessary because of the expectation that this patient will require hospital care that crosses at least 2 midnights to treat this condition based on the medical complexity of the problems presented.  Given the aforementioned information, the predictability of an adverse outcome is felt to be significant.    Karmen Bongo MD [facility]   How to contact the [facility] Attending or Consulting provider [place] or covering provider during after hours [place], for this patient?  1. Check the care team in [facility] and look for a) attending/consulting [facility] provider listed and b) the [facility] team listed 2. Log into [email or web address] and use [facility]'s universal password to access. If you do not have the password, please contact the hospital operator. 3. Locate the [facility] provider you are looking for under [facility] and page to a number that you  can be directly reached. 4. If you still have difficulty reaching the provider, please page the [facility] (Director on Call) for the Hospitalists listed on amion for assistance.   01/17/2021, 9:54 AM

## 2021-01-17 NOTE — ED Notes (Signed)
Attempted to give reportx1 

## 2021-01-17 NOTE — Progress Notes (Signed)
Secure chat with Dr. Ophelia Charter RE: PICC order, patient has pending blood culture. OK to wait in placing PICC line after blood culture resulted. Patient has 2 working PIVs. Charity fundraiser made aware. Will follow up.

## 2021-01-17 NOTE — ED Provider Notes (Signed)
MRI results confirm thoracic osteomyelitis.  Blood cultures have already been sent.  IV vancomycin has been ordered.  Will consult neurosurgery.  Patient still having persistent abdominal pain and distention.  Will need repeat CT abdomen pelvis.  She reports only having minimal back pain at this time   Zadie Rhine, MD 01/17/21 [phone]

## 2021-01-17 NOTE — ED Notes (Signed)
Pt called out for help. When this RN asked pt what was wrong pt stated "I can't breathe it really hurts" Pt then was tachypneic. This RN administered pain meds and encouraged pt to slow breathing down. When pt was calm enough to talk this RN asked the pt to describe what she was feeling pt stated "Buring in my stomach and it feels like an anaconda wrapped around me under my breast". Bebe Shaggy, MD has been notified.

## 2021-01-17 NOTE — Consult Note (Signed)
[facility]    Date of Admission:  01/16/2021     Total days of antibiotics 1               Reason for Consult: Diskitis    Referring Provider: Ophelia Charter  Primary Care Provider: Patient, No Pcp Per (Inactive)   ASSESSMENT:  Stacey Kaiser is a 50 y/o female with diskitis/osteomyeltis and compression fracture of T8. Neurosurgery recommends no surgical interventions unless neurological symptoms develop. IR consulted for evaluation for possible aspiration. Currently stable and recommend holding antibiotics pending IR evaluation to optimize culture recovery/yield. Will need prolonged course of antibiotics of at least 6 weeks. Agree with holding PICC line placement pending blood culture results. Continue with pain management per primary team.   PLAN:  1. Hold antibiotics pending IR evaluation. 2. Monitor blood cultures for presence of bacteremia. Hold PICC line placement pending culture results.  3. Start broad spectrum coverage with vancomycin and ceftriaxone after aspiration or if no IR intervention. 4. Pain management per primary team.   Principal Problem:   Discitis Active Problems:   Morbid obesity with BMI of 50.0-59.9, adult (HCC)   . docusate sodium  100 mg Oral BID  . gabapentin  100 mg Oral TID  . sodium chloride flush  3 mL Intravenous Q12H     HPI: Stacey Kaiser is a 49 y.o. female with previous medical history of scoliosis and morbid obesity admitted with back pain.  Stacey Kaiser began experiencing non-traumatic back pain in January 2022. Initially seen via tele-medicine on 2/3 through [facility] describing spasms across her upper lumbar spine going on for about 5 days and worsened when she took a deep breath. Diagnosed as spasms and given prescription for anti-inflammatories and muscle relaxer. Seen through tele-health again on 3/20 and given Zanaflex after she was helping her significant other move into  an apartment. On 3/28 seen with bilateral middle back pain without significant relief from previous treatments. X-ray chest with generalized interstitial pulmonary prominence likely subsegmental atelectasis. Diagnosed with acute thoracic back pain and prescribed albuterol and given prednisone with suspicision for long term COVID symptoms (which she has never had). On 3/29 seen at [facility] ED with suspected nerve irritation and prescribed gabapentin.   Prior to admission on 4/3 pain was noted to be different than it was before. Felt a twist and popping sensation which made pain worse. Did not have relief from previously prescribed medications/treatments. CT showed diskitis and compression fracture in T8. Transferred to [facility]. Neurosurgery evaluated with recommendations for IR assessment. Received one dose of vancomycin on 4/3. Denies fevers or chills. Pain is improved as long as she is not moving around. No change to bowel or bladder function and denies saddle anesthesia. Blood cultures from 4/3 are pending.     Review of Systems: Review of Systems  Constitutional: Negative for chills, fever and weight loss.  Respiratory: Negative for cough, shortness of breath and wheezing.   Cardiovascular: Negative for chest pain and leg swelling.  Gastrointestinal: Negative for abdominal pain, constipation, diarrhea, nausea and vomiting.  Musculoskeletal: Positive for back pain.  Skin: Negative for rash.     Past Medical History:  Diagnosis Date  . Diskitis 01/17/2021  . Family history of adverse reaction to anesthesia    " my mother had a seizure coming out of cabg surgery "  . Morbid obesity (HCC)   . Morbid obesity with BMI of 50.0-59.9, adult (HCC) 01/17/2021  .  Scoliosis     Social History   Tobacco Use  . Smoking status: Never Smoker  . Smokeless tobacco: Never Used  Vaping Use  . Vaping Use: Never used  Substance Use Topics  . Alcohol use: Yes    Comment: rare  . Drug use: No     Family History  Problem Relation Age of Onset  . Heart failure Mother   . Chronic Renal Failure Mother   . Diabetes Mother     Allergies  Allergen Reactions  . [place]ra Hives  . Other     Bees and wasps    OBJECTIVE: Blood pressure (!) 143/67, pulse 77, temperature 98.1 F (36.7 C), temperature source Oral, resp. rate 17, height 4\' 10"  (1.473 m), weight 124.7 kg, last menstrual period 01/05/2021, SpO2 94 %.  Physical Exam Constitutional:      General: She is not in acute distress.    Appearance: She is well-developed. She is obese.  Cardiovascular:     Rate and Rhythm: Normal rate and regular rhythm.     Heart sounds: Normal heart sounds.  Pulmonary:     Effort: Pulmonary effort is normal.     Breath sounds: Normal breath sounds.  Musculoskeletal:     Comments: Bilateral lower extremity with normal muscle strength and sensation. Pulses are intact and appropriate.   Skin:    General: Skin is warm and dry.  Neurological:     Mental Status: She is alert and oriented to person, place, and time.  Psychiatric:        Mood and Affect: Mood normal.     Lab Results Lab Results  Component Value Date   WBC 11.3 (H) 01/16/2021   HGB 12.3 01/16/2021   HCT 38.1 01/16/2021   MCV 92.3 01/16/2021   PLT 404 (H) 01/16/2021    Lab Results  Component Value Date   CREATININE 0.71 01/16/2021   BUN 9 01/16/2021   NA 140 01/16/2021   K 3.9 01/16/2021   CL 104 01/16/2021   CO2 26 01/16/2021    Lab Results  Component Value Date   ALT 14 01/16/2021   AST 12 (L) 01/16/2021   ALKPHOS 69 01/16/2021   BILITOT 1.0 01/16/2021     Microbiology: Recent Results (from the past 240 hour(s))  Resp Panel by RT-PCR (Flu A&B, Covid) Nasopharyngeal Swab     Status: None   Collection Time: 01/16/21  7:11 PM   Specimen: Nasopharyngeal Swab; Nasopharyngeal(NP) swabs in vial transport medium  Result Value Ref Range Status   SARS Coronavirus 2 by RT PCR NEGATIVE NEGATIVE Final    Comment:  (NOTE) SARS-CoV-2 target nucleic acids are NOT DETECTED.  The SARS-CoV-2 RNA is generally detectable in upper respiratory specimens during the acute phase of infection. The lowest concentration of SARS-CoV-2 viral copies this assay can detect is 138 copies/mL. A negative result does not preclude SARS-Cov-2 infection and should not be used as the sole basis for treatment or other patient management decisions. A negative result may occur with  improper specimen collection/handling, submission of specimen other than nasopharyngeal swab, presence of viral mutation(s) within the areas targeted by this assay, and inadequate number of viral copies(<138 copies/mL). A negative result must be combined with clinical observations, patient history, and epidemiological information. The expected result is Negative.  Fact Sheet for Patients:  03/18/21  Fact Sheet for Healthcare Providers:  BloggerCourse.com  This test is no t yet approved or cleared by the [place] FDA and  has been  authorized for detection and/or diagnosis of SARS-CoV-2 by FDA under an Emergency Use Authorization (EUA). This EUA will remain  in effect (meaning this test can be used) for the duration of the COVID-19 declaration under Section 564(b)(1) of the Act, 21 U.S.C.section 360bbb-3(b)(1), unless the authorization is terminated  or revoked sooner.       Influenza A by PCR NEGATIVE NEGATIVE Final   Influenza B by PCR NEGATIVE NEGATIVE Final    Comment: (NOTE) The Xpert Xpress SARS-CoV-2/FLU/RSV plus assay is intended as an aid in the diagnosis of influenza from Nasopharyngeal swab specimens and should not be used as a sole basis for treatment. Nasal washings and aspirates are unacceptable for Xpert Xpress SARS-CoV-2/FLU/RSV testing.  Fact Sheet for Patients: BloggerCourse.com  Fact Sheet for Healthcare  Providers: SeriousBroker.it  This test is not yet approved or cleared by the [place] FDA and has been authorized for detection and/or diagnosis of SARS-CoV-2 by FDA under an Emergency Use Authorization (EUA). This EUA will remain in effect (meaning this test can be used) for the duration of the COVID-19 declaration under Section 564(b)(1) of the Act, 21 U.S.C. section 360bbb-3(b)(1), unless the authorization is terminated or revoked.  Performed at [facility]      Marcos Eke, NP [facility]  01/17/2021  11:42 AM

## 2021-01-17 NOTE — ED Provider Notes (Signed)
I assumed care of patient in signout to follow-up on MRI. I was called to the room due to increasing abdominal pain and difficulty breathing.  On my exam patient is tachypneic and ill-appearing.  Obesity limits exam, but she has significant epigastric and upper abdominal pain tenderness with what appears to be a firm mass.  Patient reports this is new and just became acutely worse.  EKG was performed and is unremarkable   EKG Interpretation  Date/Time:  Monday January 17 2021 03:00:58 EDT Ventricular Rate:  87 PR Interval:  151 QRS Duration: 86 QT Interval:  346 QTC Calculation: 417 R Axis:   15 Text Interpretation: Sinus rhythm Confirmed by Zadie Rhine ([id number]) on 01/17/2021 3:05:31 AM      We will perform stat chest x-ray.  Patient already had CT chest abdomen pelvis while at [facility]. We will review chest x-ray and reassess MRI results are still pending   Zadie Rhine, MD 01/17/21 [id number]

## 2021-01-17 NOTE — TOC Initial Note (Addendum)
Transition of Care [facility]) - Initial/Assessment Note    Patient Details  Name: Stacey Kaiser MRN: [id number] Date of Birth: Sep 03, 1971  [facility] CM/SW Contact:    Epifanio Lesches, RN Phone Number: 01/17/2021, 5:01 PM  Clinical Narrative:   Admitted with diskitis/osteomyeltis and compression fracture of T8. Pt will need prolonged course of antibiotics of at least 6 weeks. From home with husband. Pt with PCP, no insurance.      - s/p image-guided T8-9 disc aspiration  PICC LINE placement pending blood culture results.  Referral made with [facility] for IV ABX THERAPY.   TOC team monitoring and will assist with TOC needs.....    4/6 1630 Referral made with [facility]  ( charity care) for home health RN... pt will need 6 weeks LT IV abx therapy. Approval  pending   Expected Discharge Plan: Home w Home Health Services Barriers to Discharge: Continued Medical Work up   Patient Goals and CMS Choice        Expected Discharge Plan and Services Expected Discharge Plan: Home w Home Health Services                                              Prior Living Arrangements/Services                       Activities of Daily Living Home Assistive Devices/Equipment: Eyeglasses ADL Screening (condition at time of admission) Patient's cognitive ability adequate to safely complete daily activities?: Yes Is the patient deaf or have difficulty hearing?: No Does the patient have difficulty seeing, even when wearing glasses/contacts?: No Does the patient have difficulty concentrating, remembering, or making decisions?: No Patient able to express need for assistance with ADLs?: Yes Does the patient have difficulty dressing or bathing?: No Independently performs ADLs?: Yes (appropriate for developmental age) Does the patient have difficulty walking or climbing stairs?: Yes Weakness of Legs: Both Weakness of Arms/Hands: None  Permission  Sought/Granted                  Emotional Assessment              Admission diagnosis:  Discitis of thoracic region [M46.44] Discitis [M46.40] Compression fracture of body of thoracic vertebra (HCC) [S22.000A] Patient Active Problem List   Diagnosis Date Noted  . Discitis 01/17/2021  . Morbid obesity with BMI of 50.0-59.9, adult (HCC) 01/17/2021   PCP:  Patient, No Pcp Per (Inactive) Pharmacy:   [facility] [address], [place] - [address] [place] [address] Phone: [phone] Fax: [phone]     Social Determinants of Health (SDOH) Interventions    Readmission Risk Interventions No flowsheet data found.

## 2021-01-17 NOTE — ED Notes (Signed)
Attempted to give report x2 

## 2021-01-18 ENCOUNTER — Inpatient Hospital Stay (HOSPITAL_COMMUNITY): Payer: Self-pay | Admitting: Anesthesiology

## 2021-01-18 ENCOUNTER — Inpatient Hospital Stay (HOSPITAL_COMMUNITY): Payer: Self-pay

## 2021-01-18 ENCOUNTER — Encounter (HOSPITAL_COMMUNITY)
Admission: EM | Disposition: A | Discharge status: Home Health | Payer: Self-pay | Source: Home / Self Care | Attending: Internal Medicine

## 2021-01-18 HISTORY — PX: RADIOLOGY WITH ANESTHESIA: SHX6223

## 2021-01-18 HISTORY — PX: IR FLUORO GUIDED NEEDLE PLC ASPIRATION/INJECTION LOC: IMG2395

## 2021-01-18 LAB — BASIC METABOLIC PANEL
Anion gap: 10 (ref 5–15)
BUN: 10 mg/dL (ref 6–20)
CO2: 25 mmol/L (ref 22–32)
Calcium: 9.1 mg/dL (ref 8.9–10.3)
Chloride: 102 mmol/L (ref 98–111)
Creatinine, Ser: 0.62 mg/dL (ref 0.44–1.00)
GFR, Estimated: >60 mL/min (ref >60)
Glucose, Bld: 108 mg/dL — ABNORMAL HIGH (ref 70–99)
Potassium: 3.9 mmol/L (ref 3.5–5.1)
Sodium: 137 mmol/L (ref 135–145)

## 2021-01-18 LAB — CBC
HCT: 37.2 % (ref 36.0–46.0)
Hemoglobin: 11.8 g/dL — ABNORMAL LOW (ref 12.0–15.0)
MCH: 30.3 pg (ref 26.0–34.0)
MCHC: 31.7 g/dL (ref 30.0–36.0)
MCV: 95.4 fL (ref 80.0–100.0)
Platelets: 372 10*3/uL (ref 150–400)
RBC: 3.9 MIL/uL (ref 3.87–5.11)
RDW: 14.6 % (ref 11.5–15.5)
WBC: 11.6 10*3/uL — ABNORMAL HIGH (ref 4.0–10.5)
nRBC: 0 % (ref 0.0–0.2)

## 2021-01-18 LAB — CULTURE, BLOOD (ROUTINE X 2)

## 2021-01-18 LAB — T4, FREE: Free T4: 1.04 ng/dL (ref 0.61–1.12)

## 2021-01-18 LAB — BRAIN NATRIURETIC PEPTIDE: B Natriuretic Peptide: 71.4 pg/mL (ref 0.0–100.0)

## 2021-01-18 LAB — HIV ANTIBODY (ROUTINE TESTING W REFLEX): HIV Screen 4th Generation wRfx: NONREACTIVE

## 2021-01-18 IMAGING — XA IR FLUORO GUIDE NDL PLMT / BX
1 series · 14 of 20 positions shown · IV contrast (IODINE)
Comparison: none

INDICATION: Severe midthoracic pain.  Osteomyelitis and discitis at T8-T9.

[Series 300: spine · 14 of 20 slices shown]
[im 1/20]
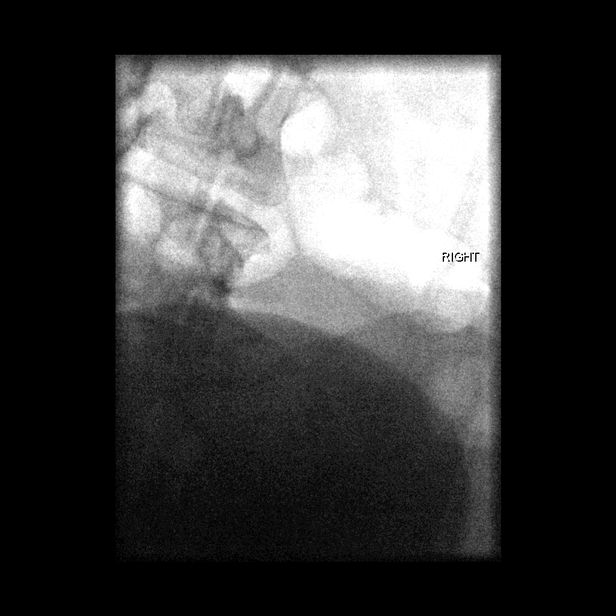
[im 3/20]
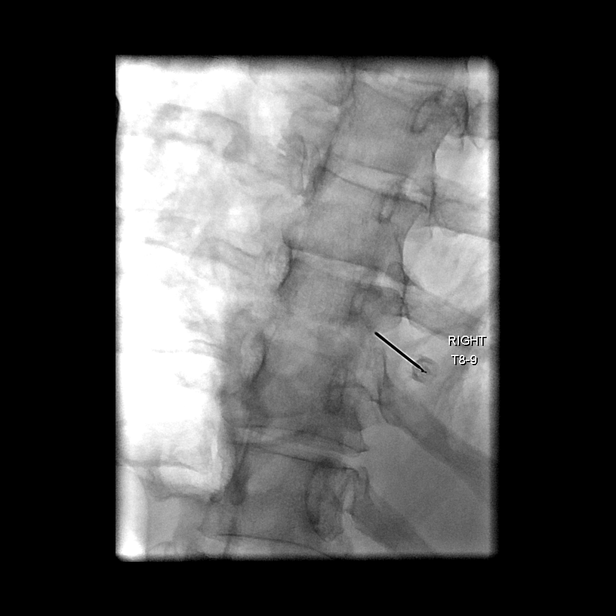
[im 4/20]
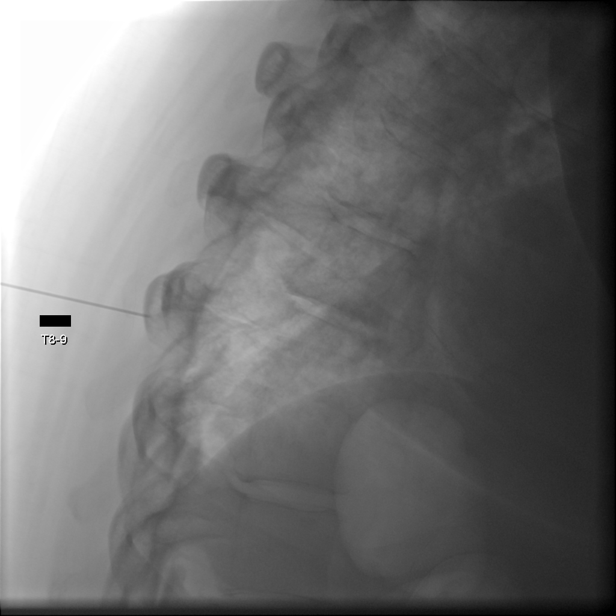
[im 6/20]
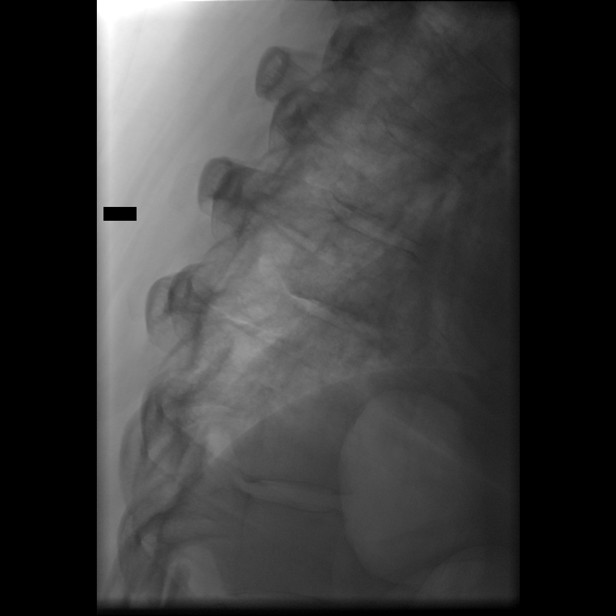
[im 7/20]
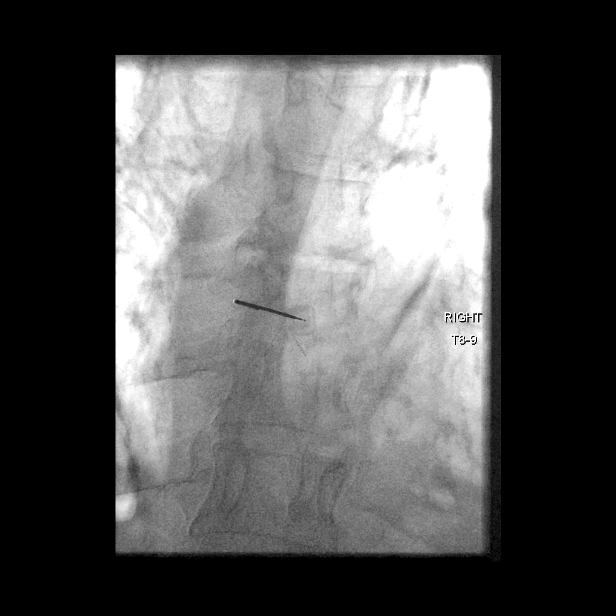
[im 8/20]
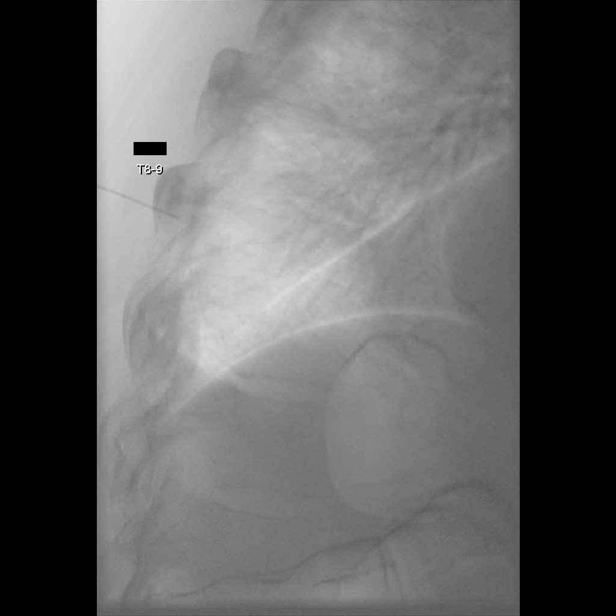
[im 10/20]
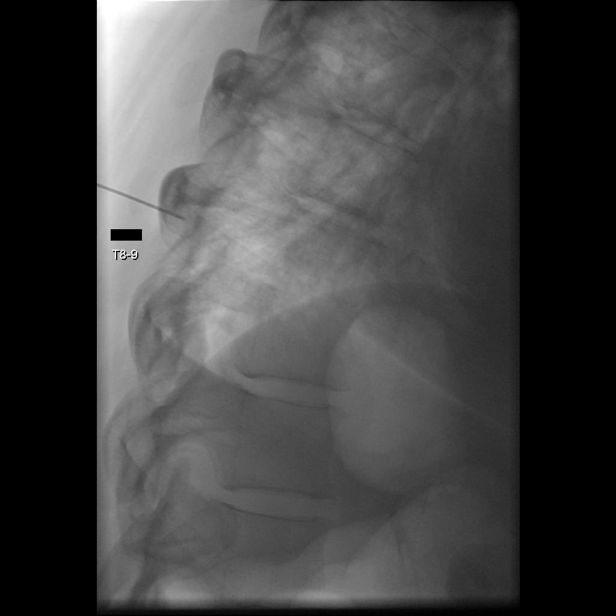
[im 11/20]
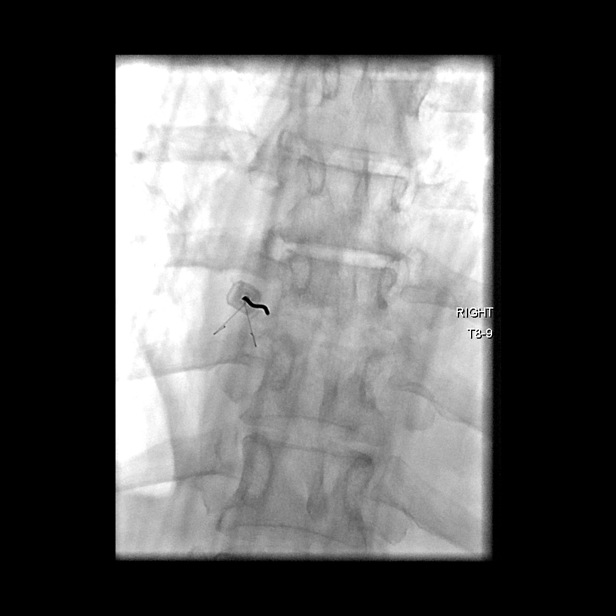
[im 13/20]
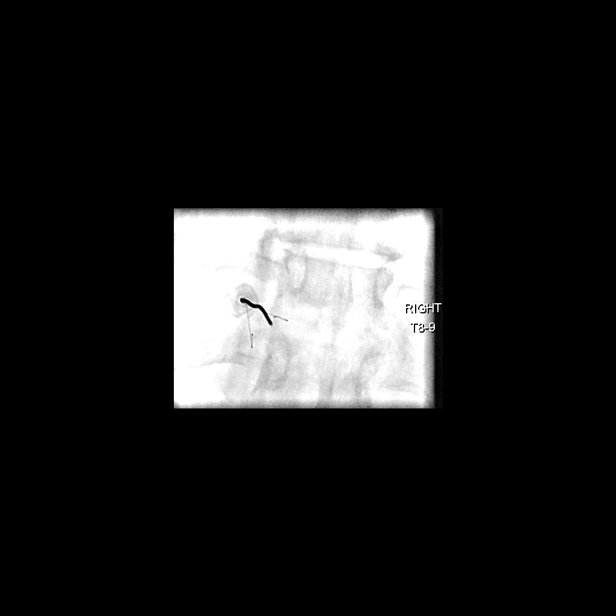
[im 14/20]
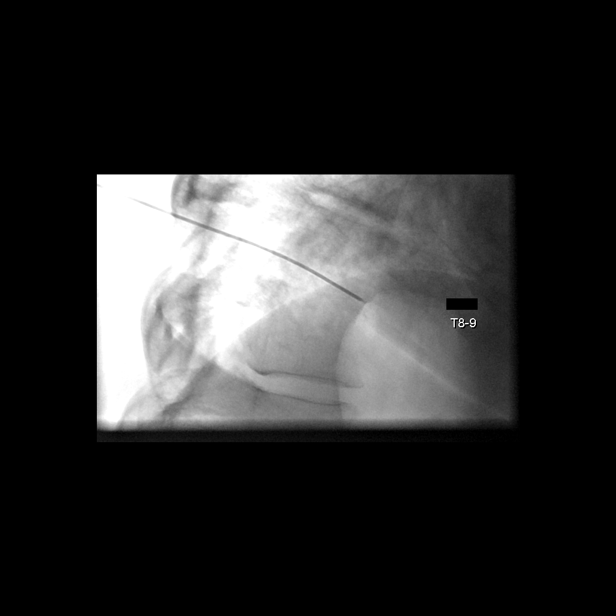
[im 16/20]
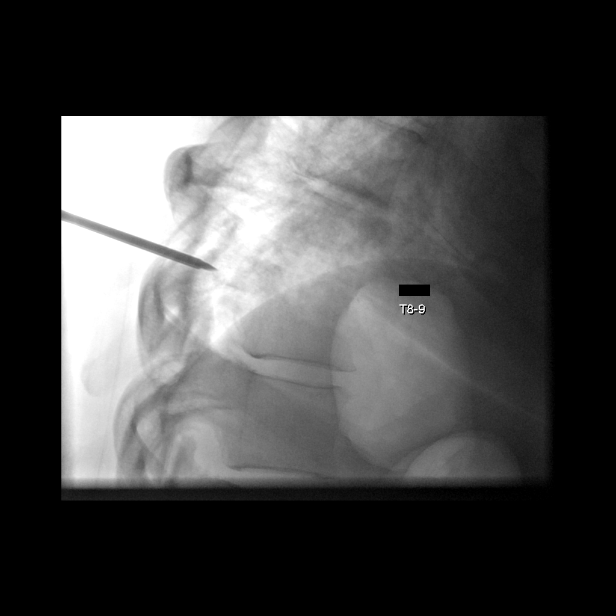
[im 17/20]
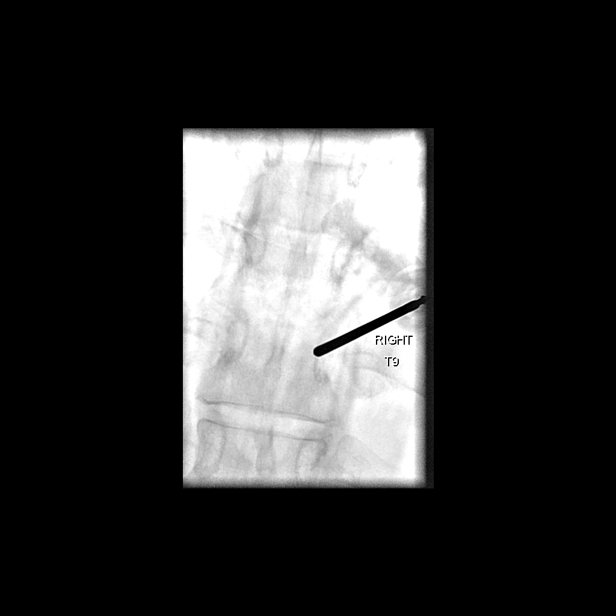
[im 18/20]
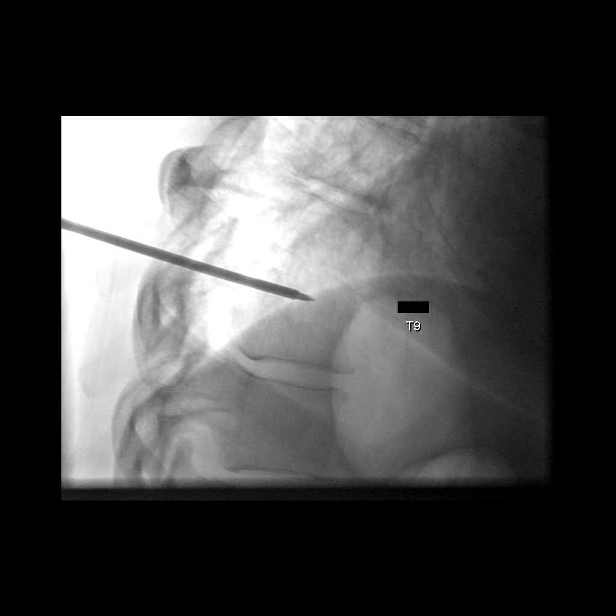
[im 20/20]
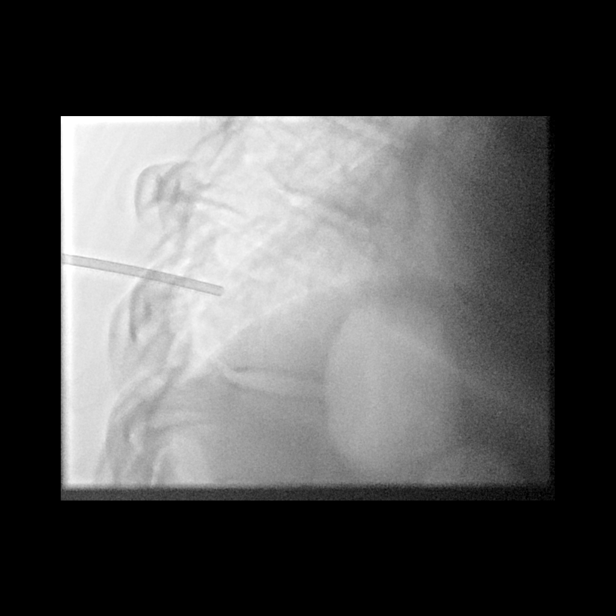

[14 of 20 positions shown; findings below may reference images not displayed]

EXAM:
FLUOROSCOPIC GUIDED BONE BIOPSY T8 AND T9

MEDICATIONS:
As per general anesthesia.

ANESTHESIA/SEDATION:
General anesthesia.

FLUOROSCOPY TIME:  Fluoroscopy Time: 14 minutes 8 seconds ([UC]
mGy).

COMPLICATIONS:
None immediate.

PROCEDURE:
Informed written consent was obtained from the patient after a
thorough discussion of the procedural risks, benefits and
alternatives. All questions were addressed. Maximal Sterile Barrier
Technique was utilized including caps, mask, sterile gowns, sterile
gloves, sterile drape, hand hygiene and skin antiseptic. A timeout
was performed prior to the initiation of the procedure.

The patient was laid prone on the fluoroscopic table. The skin
overlying the thoracic region was then prepped and draped in the
usual sterile manner.

The skin overlying the right pedicle was then infiltrated with 0.25%
bupivacaine, and carried to the pedicle. Thereafter, using biplane
intermittent fluoroscopy, a 13 gauge AQUILA spinal needle was advanced
through the right pedicle into the junction of the distal and middle
[DATE] at T9.

Core biopsies were obtained x3 passes. Tissue obtained was sent for
microbiologic analysis.

Needle was removed.  Hemostasis was achieved at the skin entry site.

Initial attempts at advancement of a 21 gauge AQUILA into the
T8-T9 disc space were unsuccessful due to severe loss of the T8-T9
disc space in addition to overhanging spurs.

Patient was extubated. Upon recovery, the patient moved all fours
equally. She followed simple commands appropriately. She was then
transferred to recovery.
IMPRESSION: Status post fluoroscopic guided bone biopsy at T8 and T9 as above.

## 2021-01-18 SURGERY — IR WITH ANESTHESIA
Anesthesia: General

## 2021-01-18 MED ORDER — SUCCINYLCHOLINE CHLORIDE 200 MG/10ML IV SOSY
PREFILLED_SYRINGE | INTRAVENOUS | Status: DC | PRN
  Administered 2021-01-18: 160 mg via INTRAVENOUS

## 2021-01-18 MED ORDER — FENTANYL CITRATE (PF) 100 MCG/2ML IJ SOLN
INTRAMUSCULAR | Status: AC
  Filled 2021-01-18: qty 2

## 2021-01-18 MED ORDER — MIDAZOLAM HCL 2 MG/2ML IJ SOLN
INTRAMUSCULAR | Status: AC
  Administered 2021-01-18: 0.5 mg via INTRAVENOUS
  Filled 2021-01-18: qty 2

## 2021-01-18 MED ORDER — MIDAZOLAM HCL 2 MG/2ML IJ SOLN
0.5000 mg | Freq: Once | INTRAMUSCULAR | Status: AC
  Administered 2021-01-18: 0.5 mg via INTRAVENOUS

## 2021-01-18 MED ORDER — LACTATED RINGERS IV SOLN: INTRAVENOUS | Status: DC | PRN

## 2021-01-18 MED ORDER — LIDOCAINE 2% (20 MG/ML) 5 ML SYRINGE
INTRAMUSCULAR | Status: DC | PRN
  Administered 2021-01-18: 80 mg via INTRAVENOUS

## 2021-01-18 MED ORDER — MIDAZOLAM HCL 2 MG/2ML IJ SOLN
INTRAMUSCULAR | Status: DC | PRN
  Administered 2021-01-18: 2 mg via INTRAVENOUS

## 2021-01-18 MED ORDER — HYDROMORPHONE HCL 1 MG/ML IJ SOLN
INTRAMUSCULAR | Status: AC
  Administered 2021-01-18: 0.5 mg via INTRAVENOUS
  Filled 2021-01-18: qty 1

## 2021-01-18 MED ORDER — ADULT MULTIVITAMIN W/MINERALS CH
1.0000 | ORAL_TABLET | Freq: Every day | ORAL | Status: DC
  Administered 2021-01-19 – 2021-01-24 (×6): 1 via ORAL
  Filled 2021-01-18 (×6): qty 1

## 2021-01-18 MED ORDER — HYDROCODONE-ACETAMINOPHEN 5-325 MG PO TABS
2.0000 | ORAL_TABLET | ORAL | Status: DC | PRN
  Administered 2021-01-19 – 2021-01-24 (×14): 2 via ORAL
  Filled 2021-01-18 (×14): qty 2

## 2021-01-18 MED ORDER — DEXAMETHASONE SODIUM PHOSPHATE 10 MG/ML IJ SOLN
INTRAMUSCULAR | Status: DC | PRN
  Administered 2021-01-18: 10 mg via INTRAVENOUS

## 2021-01-18 MED ORDER — MIDAZOLAM HCL 2 MG/2ML IJ SOLN: 0.5000 mg | Freq: Once | INTRAMUSCULAR | Status: AC

## 2021-01-18 MED ORDER — METHOCARBAMOL 1000 MG/10ML IJ SOLN
500.0000 mg | Freq: Four times a day (QID) | INTRAVENOUS | Status: DC | PRN
  Filled 2021-01-18: qty 5

## 2021-01-18 MED ORDER — BUPIVACAINE HCL 0.25 % IJ SOLN
INTRAMUSCULAR | Status: AC | PRN
  Administered 2021-01-18: 10 mL

## 2021-01-18 MED ORDER — ONDANSETRON HCL 4 MG/2ML IJ SOLN
INTRAMUSCULAR | Status: DC | PRN
  Administered 2021-01-18: 4 mg via INTRAVENOUS

## 2021-01-18 MED ORDER — HYDROMORPHONE HCL 1 MG/ML IJ SOLN
1.0000 mg | Freq: Once | INTRAMUSCULAR | Status: AC
  Administered 2021-01-18: 1 mg via INTRAVENOUS
  Filled 2021-01-18: qty 1

## 2021-01-18 MED ORDER — GABAPENTIN 300 MG PO CAPS
300.0000 mg | ORAL_CAPSULE | Freq: Three times a day (TID) | ORAL | Status: DC
  Administered 2021-01-18 – 2021-01-19 (×2): 300 mg via ORAL
  Filled 2021-01-18 (×2): qty 1

## 2021-01-18 MED ORDER — PHENYLEPHRINE HCL-NACL 10-0.9 MG/250ML-% IV SOLN
INTRAVENOUS | Status: DC | PRN
  Administered 2021-01-18: 40 ug/min via INTRAVENOUS

## 2021-01-18 MED ORDER — BUPIVACAINE HCL (PF) 0.5 % IJ SOLN
INTRAMUSCULAR | Status: AC
  Filled 2021-01-18: qty 30

## 2021-01-18 MED ORDER — PHENYLEPHRINE 40 MCG/ML (10ML) SYRINGE FOR IV PUSH (FOR BLOOD PRESSURE SUPPORT)
PREFILLED_SYRINGE | INTRAVENOUS | Status: DC | PRN
  Administered 2021-01-18: 80 ug via INTRAVENOUS
  Administered 2021-01-18: 120 ug via INTRAVENOUS

## 2021-01-18 MED ORDER — HYDROMORPHONE HCL 1 MG/ML IJ SOLN
0.2500 mg | INTRAMUSCULAR | Status: DC | PRN
Start: 2021-01-18 — End: 2021-01-19
  Administered 2021-01-18 (×2): 0.5 mg via INTRAVENOUS

## 2021-01-18 MED ORDER — SUGAMMADEX SODIUM 200 MG/2ML IV SOLN
INTRAVENOUS | Status: DC | PRN
  Administered 2021-01-18 (×2): 200 mg via INTRAVENOUS

## 2021-01-18 MED ORDER — METHOCARBAMOL 1000 MG/10ML IJ SOLN
1000.0000 mg | Freq: Four times a day (QID) | INTRAVENOUS | Status: DC | PRN
  Administered 2021-01-18 – 2021-01-20 (×5): 1000 mg via INTRAVENOUS
  Filled 2021-01-18 (×6): qty 10

## 2021-01-18 MED ORDER — ROCURONIUM BROMIDE 10 MG/ML (PF) SYRINGE
PREFILLED_SYRINGE | INTRAVENOUS | Status: DC | PRN
  Administered 2021-01-18: 40 mg via INTRAVENOUS
  Administered 2021-01-18: 20 mg via INTRAVENOUS

## 2021-01-18 MED ORDER — PROPOFOL 10 MG/ML IV BOLUS
INTRAVENOUS | Status: DC | PRN
  Administered 2021-01-18: 200 mg via INTRAVENOUS

## 2021-01-18 MED ORDER — MIDAZOLAM HCL 2 MG/2ML IJ SOLN
INTRAMUSCULAR | Status: AC
  Filled 2021-01-18: qty 2

## 2021-01-18 MED ORDER — FENTANYL CITRATE (PF) 250 MCG/5ML IJ SOLN
INTRAMUSCULAR | Status: DC | PRN
  Administered 2021-01-18: 100 ug via INTRAVENOUS

## 2021-01-18 MED ORDER — ENSURE MAX PROTEIN PO LIQD
11.0000 [oz_av] | Freq: Two times a day (BID) | ORAL | Status: DC
  Administered 2021-01-18 – 2021-01-24 (×9): 11 [oz_av] via ORAL

## 2021-01-18 NOTE — Sedation Documentation (Signed)
Pt transported to PACU 17 via stretcher accompanied by RN and CRNAs. Handoff given to receiving PACU RN. No s/s of distress at this time. Pt awake and responds appropriately.

## 2021-01-18 NOTE — Anesthesia Procedure Notes (Signed)
Procedure Name: Intubation Date/Time: 01/18/2021 1:05 PM Performed by: Aundria Rud, CRNA Pre-anesthesia Checklist: Patient identified, Emergency Drugs available, Suction available and Patient being monitored Patient Re-evaluated:Patient Re-evaluated prior to induction Oxygen Delivery Method: Circle System Utilized Preoxygenation: Pre-oxygenation with 100% oxygen Induction Type: IV induction, Rapid sequence and Cricoid Pressure applied Ventilation: Mask ventilation without difficulty, Oral airway inserted - appropriate to patient size and Two handed mask ventilation required Laryngoscope Size: Miller and 2 Grade View: Grade II Tube type: Oral Tube size: 7.5 mm Number of attempts: 2 Airway Equipment and Method: Stylet and Oral airway Placement Confirmation: ETT inserted through vocal cords under direct vision,  positive ETCO2 and breath sounds checked- equal and bilateral Secured at: 21 cm Tube secured with: Tape Dental Injury: Teeth and Oropharynx as per pre-operative assessment  Difficulty Due To: Difficulty was anticipated, Difficult Airway- due to large tongue, Difficult Airway- due to reduced neck mobility and Difficult Airway- due to limited oral opening Future Recommendations: Recommend- induction with short-acting agent, and alternative techniques readily available Comments: Attempted intubation with glidepro 3 with grade 1 view, however unable to place ETT due to limited mouth opening and large tongue. Placed oral airway and two handed masked with 100% O2. DVL x 1 with Hyacinth Meeker 2 with grade 2b view, sats started to drop and withdrew and masked with oral airway and two handed. Dr. Neva Seat DVL x 1 with grade 2 view with Hyacinth Meeker 2.

## 2021-01-18 NOTE — Anesthesia Procedure Notes (Deleted)
Procedure Name: Intubation Date/Time: 01/18/2021 1:05 PM Performed by: Ulla Potash, RN Pre-anesthesia Checklist: Patient identified, Emergency Drugs available, Suction available and Patient being monitored Patient Re-evaluated:Patient Re-evaluated prior to induction Oxygen Delivery Method: Circle System Utilized Preoxygenation: Pre-oxygenation with 100% oxygen Induction Type: IV induction, Cricoid Pressure applied and Rapid sequence Ventilation: Mask ventilation without difficulty Laryngoscope Size: Miller and 2 Grade View: Grade II Tube type: Oral Tube size: 7.5 mm Number of attempts: 2 Airway Equipment and Method: Stylet and Oral airway Placement Confirmation: ETT inserted through vocal cords under direct vision,  positive ETCO2 and breath sounds checked- equal and bilateral Secured at: 22 cm Tube secured with: Tape Dental Injury: Teeth and Oropharynx as per pre-operative assessment  Difficulty Due To: Difficulty was anticipated, Difficult Airway- due to large tongue, Difficult Airway- due to reduced neck mobility and Difficult Airway- due to limited oral opening Future Recommendations: Recommend- induction with short-acting agent, and alternative techniques readily available Comments: Attempted intubation with Glidepro 3 with grade 1 view. Unable to advance ETT due to limited mouth opening and large tongue. Placed oral airway and 2 handed mask 100% O2. Attempted DVL with miller 2 with grade 2b view. Intubated by Dr. Neva Seat with Hyacinth Meeker 2 and grade 2 view. Patient was not in optimal position in hospital bed.

## 2021-01-18 NOTE — Progress Notes (Signed)
Subjective: The patient is alert and pleasant.  Her back pain is "the same".  She denies lower extremity numbness, tingling, weakness.  Her antibiotics are on hold for the needle aspiration.  Objective: Vital signs in last 24 hours: Temp:  [98.1 F (36.7 C)-99.1 F (37.3 C)] 98.3 F (36.8 C) (04/05 0451) Pulse Rate:  [70-79] 79 (04/05 0451) Resp:  [17-20] 17 (04/05 0451) BP: (122-143)/(66-80) 122/78 (04/05 0451) SpO2:  [94 %-98 %] 98 % (04/05 0451) Estimated body mass index is 57.48 kg/m as calculated from the following:   Height as of this encounter: 4\' 10"  (1.473 m).   Weight as of this encounter: 124.7 kg.   Intake/Output from previous day: No intake/output data recorded. Intake/Output this shift: No intake/output data recorded.  Physical exam the patient is alert and oriented.  Her strength is normal in her bilateral quadricep, gastrocnemius and dorsiflexors.  Her sensation is normal in her lower extremities.  Lab Results: Recent Labs    01/16/21 1631 01/18/21 0400  WBC 11.3* 11.6*  HGB 12.3 11.8*  HCT 38.1 37.2  PLT 404* 372   BMET Recent Labs    01/16/21 1631 01/18/21 0400  NA 140 137  K 3.9 3.9  CL 104 102  CO2 26 25  GLUCOSE 114* 108*  BUN 9 10  CREATININE 0.71 0.62  CALCIUM 9.1 9.1    Studies/Results: CT ABDOMEN PELVIS WO CONTRAST  Result Date: 01/17/2021 CLINICAL DATA:  50 year old female with abdominal distension EXAM: CT ABDOMEN AND PELVIS WITHOUT CONTRAST TECHNIQUE: Multidetector CT imaging of the abdomen and pelvis was performed following the standard protocol without IV contrast. COMPARISON:  CT abdomen/pelvis yesterday 01/16/2021; MRI thoracic spine earlier today FINDINGS: Lower chest: Trace bilateral pleural effusions with associated lower lobe atelectasis. Bronchial wall thickening present in both lower lobes. Stable heart size. No pericardial effusion. Unremarkable distal thoracic esophagus. Hepatobiliary: Normal hepatic contour and morphology.  No discrete hepatic lesion. High attenuation material present within the gallbladder consistent with vicarious excretion of contrast. No intra or extrahepatic biliary ductal dilatation. Pancreas: Unremarkable. No pancreatic ductal dilatation or surrounding inflammatory changes. Spleen: Normal in size without focal abnormality. Adrenals/Urinary Tract: Normal adrenal glands. Normal renal size and morphology without exophytic mass, hydronephrosis or nephrolithiasis. Excreted contrast material present within the calices and urinary bladder. The ureters are unremarkable. No evidence of bladder mass, stone or other filling defect. Stomach/Bowel: No evidence of obstruction or focal bowel wall thickening. Normal appendix in the right lower quadrant. The terminal ileum is unremarkable. Vascular/Lymphatic: Limited evaluation in the absence of intravenous contrast. No significant atherosclerotic plaque, or aneurysm. No suspicious lymphadenopathy. Reproductive: Unremarkable uterus and right adnexa. Low-attenuation cystic structures arising from the left ovary demonstrate no change compared to yesterday. In a reproductive age female, findings remain consistent with benign ovarian cysts. Other: No abdominal wall hernia or abnormality. No abdominopelvic ascites. Musculoskeletal: Destructive changes at T8-T9 with erosion of the inferior endplate of T8 in the superior endplate of T9. Associated focal exaggerated thoracic kyphosis. Paraspinal phlegmonous changes are also again noted. There has been no significant interval progression in the short interval since prior imaging. IMPRESSION: 1. Imaging findings consistent with osteomyelitis/discitis at T8-T9 again demonstrated. 2. No acute abnormality or interval change in the abdomen or pelvis. Electronically Signed   By: 03/18/2021 M.D.   On: 01/17/2021 05:22   MR THORACIC SPINE W WO CONTRAST  Result Date: 01/17/2021 CLINICAL DATA:  Initial evaluation for compression  fracture. EXAM: MRI THORACIC WITHOUT AND WITH CONTRAST  TECHNIQUE: Multiplanar and multiecho pulse sequences of the thoracic spine were obtained without and with intravenous contrast. CONTRAST:  63mL GADAVIST GADOBUTROL 1 MMOL/ML IV SOLN COMPARISON:  Prior CT from 01/16/2021 FINDINGS: Alignment: Slight exaggeration of the normal thoracic kyphosis, apex at T8-9. No listhesis. Vertebrae: Abnormal edema and enhancement with loss of disc space height and endplate irregularity seen about the T8-9 interspace, consistent with acute osteomyelitis discitis. Associated height loss of up to approximately 75% at the adjacent T9 vertebral body, with more moderate approximate 50% height loss at T8. Edema and enhancement extends into the T8-9 facets bilaterally, consistent with associated septic arthritis. Evidence for associated epidural phlegmon and enhancement within both the ventral and dorsal epidural space at the level of T8-9 (series 10, image 8). No frank epidural abscess visible on this motion degraded exam. Additionally, mild marrow edema and enhancement with disc space height loss noted about the adjacent T7-8 interspace, also concerning for acute osteomyelitis discitis as well. This is more mild in appearance as compared to the adjacent T8-9 interspace. Associated paraspinous edema and enhancement adjacent to the infected T8-9 interspace without visible soft tissue abscess. No other sites of acute infection within the thoracic spine. Vertebral body height otherwise maintained. Diffusely decreased T1 signal intensity seen throughout the visualized bone marrow, nonspecific, but most commonly related to anemia, smoking or obesity. No worrisome osseous lesions. Cord: No convincing cord signal abnormality seen on this motion degraded exam. Paraspinal and other soft tissues: Paraspinous edema and enhancement adjacent to the T8-9 interspace, consistent with infection. No discrete soft tissue collections or abscesses. Small  layering bilateral pleural effusions noted. Disc levels: T8-9: Findings consistent with osteomyelitis discitis. Associated compression deformity at the superior endplate of T9 with up to 50-60% height loss and 4 mm bony retropulsion. Epidural phlegmon and enhancement within the ventral and dorsal epidural space without definite frank abscess formation. Resultant moderate spinal stenosis with mild cord flattening. No definite cord signal changes on this motion degraded exam. Moderate right worse than left foraminal narrowing. Additional fairly mild degenerative changes for age noted elsewhere within the thoracic spine. No other significant canal or foraminal stenosis. No other neural impingement. IMPRESSION: 1. Findings consistent with acute osteomyelitis discitis and septic arthritis at T8-9. Associated compression fracture of T9 with up to 75% anterior height loss and 4 mm bony retropulsion, with 50% height loss at T8. Superimposed epidural phlegmon at this level with resultant moderate spinal stenosis and mild cord flattening, but no definite cord signal changes on this motion degraded exam. 2. Mild edema and enhancement about the adjacent T7-8 interspace, also consistent with acute osteomyelitis discitis. 3. Associated paraspinous edema and enhancement adjacent to the T8-9 interspace without discrete soft tissue abscess. 4. Small layering bilateral pleural effusions. Electronically Signed   By: Rise Mu M.D.   On: 01/17/2021 01:31   CT Abdomen Pelvis W Contrast  Result Date: 01/16/2021 CLINICAL DATA:  Upper back pain that radiates. EXAM: CT ANGIOGRAPHY CHEST CT ABDOMEN AND PELVIS WITH CONTRAST TECHNIQUE: Multidetector CT imaging of the chest was performed using the standard protocol during bolus administration of intravenous contrast. Multiplanar CT image reconstructions and MIPs were obtained to evaluate the vascular anatomy. Multidetector CT imaging of the abdomen and pelvis was performed using  the standard protocol during bolus administration of intravenous contrast. CONTRAST:  OMNIPAQUE IOHEXOL 350 MG/ML SOLN COMPARISON:  None. FINDINGS: CTA CHEST FINDINGS Cardiovascular: Noncontrast imaging demonstrates no intramural hematoma within the thoracic aorta. Contrast enhanced imaging demonstrates no aortic dissection  or aneurysm. Great vessels are normal. No pericardial fluid. The main pulmonary artery is prominent at 40 mm. No filling defects within the pulmonary arteries identified. Mediastinum/Nodes: No axillary or supraclavicular adenopathy. No mediastinal or hilar adenopathy. No pericardial fluid. Esophagus normal. Lungs/Pleura: low lung volumes. Small bilateral pleural effusions. Bibasilar passive atelectasis. There is interlobular septal thickening consistent with interstitial edema. No pneumothorax. No pneumonia or infarction. Musculoskeletal: There is a compression deformity superior endplate of the T9 vertebral body with focal kyphosis. There is loss of cortical definition at this level. Additionally there is soft tissue thickening in the paravertebral space at this level (image 88/3 axial and sagittal image 96/series 7). Review of the MIP images confirms the above findings. CT ABDOMEN and PELVIS FINDINGS Hepatobiliary: No focal hepatic lesion. No biliary duct dilatation. Common bile duct is normal. Pancreas: Pancreas is normal. No ductal dilatation. No pancreatic inflammation. Spleen: Normal spleen Adrenals/urinary tract: Adrenal glands and kidneys are normal. The ureters and bladder normal. Stomach/Bowel: Stomach, small-bowel and cecum are normal. The appendix is not identified but there is no pericecal inflammation to suggest appendicitis. The colon and rectosigmoid colon are normal. Vascular/Lymphatic: Abdominal aorta is normal caliber. No periportal or retroperitoneal adenopathy. No pelvic adenopathy. Reproductive: Uterus and ovaries normal. Other: No free fluid. Musculoskeletal: No  aggressive osseous lesion. Review of the MIP images confirms the above findings. IMPRESSION: Chest Impression: 1. Loss of superior endplate height at T9 with associated inflammatory process in the perivertebral soft tissue. Findings concerning for discitis - osteomyelitis. RECOMMEND MRI of the thoracic spine without with contrast. 2. No evidence of aortic dissection or aneurysm. 3. Prominent main pulmonary artery suggest pulmonary hypertension. 4. Low lung volumes Abdomen / Pelvis Impression: 1. No acute findings in the abdomen pelvis. Findings conveyed toJOSHUA HONG on 01/16/2021  at19:02. Electronically Signed   By: Genevive Bi M.D.   On: 01/16/2021 19:02   DG Chest Port 1 View  Result Date: 01/17/2021 CLINICAL DATA:  Pain EXAM: PORTABLE CHEST 1 VIEW COMPARISON:  01/16/2021 FINDINGS: The heart size is enlarged. There are areas of atelectasis at the lung bases. There is vascular congestion. There are probable small bilateral pleural effusions. There is no pneumothorax. IMPRESSION: Cardiomegaly with vascular congestion and probable small bilateral pleural effusions. Electronically Signed   By: Katherine Mantle M.D.   On: 01/17/2021 03:28   CT Angio Chest Aorta w/CM &/OR wo/CM  Result Date: 01/16/2021 CLINICAL DATA:  Upper back pain that radiates. EXAM: CT ANGIOGRAPHY CHEST CT ABDOMEN AND PELVIS WITH CONTRAST TECHNIQUE: Multidetector CT imaging of the chest was performed using the standard protocol during bolus administration of intravenous contrast. Multiplanar CT image reconstructions and MIPs were obtained to evaluate the vascular anatomy. Multidetector CT imaging of the abdomen and pelvis was performed using the standard protocol during bolus administration of intravenous contrast. CONTRAST:  OMNIPAQUE IOHEXOL 350 MG/ML SOLN COMPARISON:  None. FINDINGS: CTA CHEST FINDINGS Cardiovascular: Noncontrast imaging demonstrates no intramural hematoma within the thoracic aorta. Contrast enhanced imaging  demonstrates no aortic dissection or aneurysm. Great vessels are normal. No pericardial fluid. The main pulmonary artery is prominent at 40 mm. No filling defects within the pulmonary arteries identified. Mediastinum/Nodes: No axillary or supraclavicular adenopathy. No mediastinal or hilar adenopathy. No pericardial fluid. Esophagus normal. Lungs/Pleura: low lung volumes. Small bilateral pleural effusions. Bibasilar passive atelectasis. There is interlobular septal thickening consistent with interstitial edema. No pneumothorax. No pneumonia or infarction. Musculoskeletal: There is a compression deformity superior endplate of the T9 vertebral body with  focal kyphosis. There is loss of cortical definition at this level. Additionally there is soft tissue thickening in the paravertebral space at this level (image 88/3 axial and sagittal image 96/series 7). Review of the MIP images confirms the above findings. CT ABDOMEN and PELVIS FINDINGS Hepatobiliary: No focal hepatic lesion. No biliary duct dilatation. Common bile duct is normal. Pancreas: Pancreas is normal. No ductal dilatation. No pancreatic inflammation. Spleen: Normal spleen Adrenals/urinary tract: Adrenal glands and kidneys are normal. The ureters and bladder normal. Stomach/Bowel: Stomach, small-bowel and cecum are normal. The appendix is not identified but there is no pericecal inflammation to suggest appendicitis. The colon and rectosigmoid colon are normal. Vascular/Lymphatic: Abdominal aorta is normal caliber. No periportal or retroperitoneal adenopathy. No pelvic adenopathy. Reproductive: Uterus and ovaries normal. Other: No free fluid. Musculoskeletal: No aggressive osseous lesion. Review of the MIP images confirms the above findings. IMPRESSION: Chest Impression: 1. Loss of superior endplate height at T9 with associated inflammatory process in the perivertebral soft tissue. Findings concerning for discitis - osteomyelitis. RECOMMEND MRI of the thoracic  spine without with contrast. 2. No evidence of aortic dissection or aneurysm. 3. Prominent main pulmonary artery suggest pulmonary hypertension. 4. Low lung volumes Abdomen / Pelvis Impression: 1. No acute findings in the abdomen pelvis. Findings conveyed toJOSHUA HONG on 01/16/2021  at19:02. Electronically Signed   By: Genevive BiStewart  Edmunds M.D.   On: 01/16/2021 19:02   US EKG SITE RITE  Result Date: 01/17/2021 If Site Rite image not attached, placement could not be confirmed due to current cardiac rhythm.   Assessment/Plan: Thoracic discitis, osteomyelitis, stenosis, pain: Hopefully the patient's needle aspiration can be done without delay so we can restart her empiric antibiotics soon as possible.  I would like to avoid.  LOS: 1 day     Cristi LoronJeffrey D Dreydon Cardenas 01/18/2021, 8:04 AM

## 2021-01-18 NOTE — Transfer of Care (Signed)
Immediate Anesthesia Transfer of Care Note  Patient: Stacey Kaiser  Procedure(s) Performed: IR WITH ANESTHESIA (N/A )  Patient Location: PACU  Anesthesia Type:General  Level of Consciousness: awake, alert , oriented and patient cooperative  Airway & Oxygen Therapy: Patient Spontanous Breathing and Patient connected to face mask oxygen  Post-op Assessment: Report given to RN, Post -op Vital signs reviewed and stable and Patient moving all extremities  Post vital signs: Reviewed and stable  Last Vitals:  Vitals Value Taken Time  BP 144/83   Temp    Pulse 87 01/18/21 1455  Resp 33 01/18/21 1455  SpO2 100 % 01/18/21 1455  Vitals shown include unvalidated device data.  Last Pain:  Vitals:   01/18/21 1220  TempSrc:   PainSc: 2          Complications: No complications documented.

## 2021-01-18 NOTE — Sedation Documentation (Signed)
Procedure started. Anesthesia case 

## 2021-01-18 NOTE — Anesthesia Preprocedure Evaluation (Addendum)
Anesthesia Evaluation  Patient identified by MRN, date of birth, ID band Patient awake  General Assessment Comment:History noted Dr. Chilton Si  Reviewed: Allergy & Precautions, NPO status Preop documentation limited or incomplete due to emergent nature of procedure.  Airway Mallampati: II  TM Distance: >3 FB     Dental   Pulmonary neg pulmonary ROS,    breath sounds clear to auscultation       Cardiovascular negative cardio ROS   Rhythm:Regular Rate:Normal     Neuro/Psych negative neurological ROS     GI/Hepatic negative GI ROS, Neg liver ROS,   Endo/Other  negative endocrine ROS  Renal/GU negative Renal ROS     Musculoskeletal  (+) Arthritis ,   Abdominal   Peds  Hematology   Anesthesia Other Findings   Reproductive/Obstetrics                            Anesthesia Physical Anesthesia Plan  ASA: III  Anesthesia Plan: General   Post-op Pain Management:    Induction: Intravenous  PONV Risk Score and Plan: 2 and Ondansetron and Midazolam  Airway Management Planned: Oral ETT  Additional Equipment:   Intra-op Plan:   Post-operative Plan: Extubation in OR  Informed Consent: I have reviewed the patients History and Physical, chart, labs and discussed the procedure including the risks, benefits and alternatives for the proposed anesthesia with the patient or authorized representative who has indicated his/her understanding and acceptance.     Dental advisory given  Plan Discussed with: CRNA, Anesthesiologist and Surgeon  Anesthesia Plan Comments:        Anesthesia Quick Evaluation

## 2021-01-18 NOTE — Progress Notes (Addendum)
Initial Nutrition Assessment  DOCUMENTATION CODES:   Not applicable  INTERVENTION:    Ensure MAX Protein po BID, each supplement provides 150 kcal and 30 grams of protein  MVI daily   NUTRITION DIAGNOSIS:   Increased nutrient needs related to acute illness as evidenced by estimated needs.  GOAL:   Patient will meet greater than or equal to 90% of their needs  MONITOR:   PO intake,Supplement acceptance,Weight trends,Labs,I & O's  REASON FOR ASSESSMENT:   Consult Assessment of nutrition requirement/status  ASSESSMENT:   Patient with PMH significant for diskitis and scoliosis. Presents this admission with T8-9 osteomyelitis/discitis with compression fracture.  Patient endorses decreased intake over the last six months due to stress. 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 Typically eats one meal daily that consists of lasagna or meat with vegetable. Discussed eating three balanced meals daily. Only taking liquids as she cannot sit up given back pain. RD to provide supplementation to maximize protein this admission.   Patient denies weight loss. Records show multiple stated weights, unable to assess for weight loss.   Medications: colace Labs: CBG 108-114    NUTRITION - FOCUSED PHYSICAL EXAM:  Flowsheet Row Most Recent Value  Orbital Region No depletion  Upper Arm Region No depletion  Thoracic and Lumbar Region No depletion  Buccal Region No depletion  Temple Region No depletion  Clavicle Bone Region No depletion  Clavicle and Acromion Bone Region No depletion  Scapular Bone Region No depletion  Dorsal Hand No depletion  Patellar Region No depletion  Anterior Thigh Region No depletion  Edema (RD Assessment) None  Hair Reviewed  Eyes Reviewed  Mouth Reviewed  Skin Reviewed  Nails Reviewed     Diet Order:   Diet Order            Diet NPO time specified Except for: Sips with Meds  Diet effective midnight                  EDUCATION NEEDS:   Not appropriate for education at this time  Skin:  Skin Assessment: Skin Integrity Issues: Skin Integrity Issues:: Other (Comment) Other: puncture mid back  Last BM:  PTA  Height:   Ht Readings from Last 1 Encounters:  01/16/21 4\' 10"  (1.473 m)    Weight:   Wt Readings from Last 1 Encounters:  01/16/21 124.7 kg    BMI:  Body mass index is 57.48 kg/m.  Estimated Nutritional Needs:   Kcal:  1700-1900 kcal  Protein:  `85-100 grams  Fluid:  >/= 1.7 L/day  03/18/21 RD, LDN Clinical Nutrition Pager listed in [facility]

## 2021-01-18 NOTE — Plan of Care (Signed)
  Problem: Activity: Goal: Risk for activity intolerance will decrease Outcome: Progressing   Problem: Coping: Goal: Level of anxiety will decrease Outcome: Progressing   Problem: Pain Managment: Goal: General experience of comfort will improve Outcome: Progressing   Problem: Safety: Goal: Ability to remain free from injury will improve Outcome: Progressing   Problem: Skin Integrity: Goal: Risk for impaired skin integrity will decrease Outcome: Progressing   

## 2021-01-18 NOTE — Progress Notes (Signed)
PROGRESS NOTE    Stacey Kaiser  ZOX:[id number] DOB: 01/17/1971 DOA: 01/16/2021 PCP: Patient, No Pcp Per (Inactive)   Chief Complain: Back pain  Brief Narrative: Patient is a 50 year old female with history of morbid obesity with BMI of 57 who presents with severe back pain.  She was seen and treated with different medications for back pain as an outpatient but it did not resolve so she presented to the emergency department.  CT imagings showed discitis and compression fracture at T8.  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  ID/IR/neurosurgery consulted.  Plan for discharge patient today followed by IV antibiotics.  Cultures have been sent.  Assessment & Plan:   Principal Problem:   Discitis Active Problems:   Morbid obesity with BMI of 50.0-59.9, adult (HCC)   Discitis/osteomyelitis: Imagings showed discitis/osteomyelitis on T8-T9 with compression fracture of T9 and also presence of epidural phlegmon.  ID/IR/neurosurgery consulted.  Plan for disc aspiration today followed by IV antibiotics.  Cultures have been sent, will follow cultures. Currently she is afebrile, has mild  Leukocytosis. Continue pain management, supportive care.  Back pain: Severe and secondary to discitis.  Continue supportive care, pain management, muscle relaxants.  Will request PT/OT evaluation when appropriate  Abdominal distention: Severely distended abdomen but denies any abdominal pain.  Abdominal imaging she did not show any intra-abdominal or intrapelvic pathology.  Morbid obesity: BMI 57.48.  Weight loss, healthy diet encouraged  Elevated TSH: Mild elevated TSH of 7.7.  Will check free T4 and T3  Uninsured: TOC consulted  Respiratory insufficiency: Currently on 2 to 3 days of nasal cannula oxygen.  No history of respiratory disease.  Chest x-ray some cardiomegaly with congestion and small bilateral pleural effusion.   We might consider echocardiogram.  Lungs are clear on auscultation.           DVT prophylaxis:SCD Code Status: Full Family Communication: None at the bedside Status is: Inpatient  Remains inpatient appropriate because:Inpatient level of care appropriate due to severity of illness   Dispo: The patient is from: Home              Anticipated d/c is to: Home              Patient currently is not medically stable to d/c.   Difficult to place patient No    Consultants: IR, neurosurgery, ID  Procedures:Plan doe disc aspiration  Antimicrobials:  Anti-infectives (From admission, onward)   Start     Dose/Rate Route Frequency Ordered Stop   01/17/21 0400  vancomycin (VANCOCIN) 2,500 mg in sodium chloride 0.9 % 500 mL IVPB        2,500 mg 250 mL/hr over 120 Minutes Intravenous  Once 01/17/21 0350 01/17/21 0629      Subjective: Patient seen and examined the bedside this morning.  Hemodynamically stable.  Continues to complain of severe back pain.  When I presented to the room, she started having severe right upper quadrant pain but slowly improved.  Very anxious for upcoming IR procedure  Objective: Vitals:   01/17/21 1453 01/17/21 1948 01/18/21 0451 01/18/21 0813  BP: 130/66 139/80 122/78 131/80  Pulse: 70 75 79 81  Resp: Temp: 98.3 F (36.8 C) 99.1 F (37.3 C) 98.3 F (36.8 C) 99.8 F (37.7 C)  TempSrc: Oral Oral Oral Oral  SpO2: 95% 98% 98% 96%  Weight:      Height:  No intake or output data in the 24 hours ending 01/18/21 0910 Filed Weights   01/16/21 1621  Weight: 124.7 kg    Examination:  General exam: In severe back pain, anxious, morbidly obese HEENT:PERRL,Oral mucosa moist, Ear/Nose normal on gross exam Respiratory system: Bilateral equal air entry, normal vesicular breath sounds, no wheezes or crackles  Cardiovascular system: S1 & S2 heard, RRR. No JVD, murmurs, rubs, gallops or clicks. No pedal edema. Gastrointestinal system: Abdomen  is distended but nontender.  Normal bowel sounds Central nervous system: Alert and oriented. No focal neurological deficits. Extremities: No edema, no clubbing ,no cyanosis Skin: No rashes, lesions or ulcers,no icterus ,no pallor    Data Reviewed: I have personally reviewed following labs and imaging studies  CBC: Recent Labs  Lab 01/16/21 1631 01/18/21 0400  WBC 11.3* 11.6*  NEUTROABS 8.7*  --   HGB 12.3 11.8*  HCT 38.1 37.2  MCV 92.3 95.4  PLT 404* 372   Basic Metabolic Panel: Recent Labs  Lab 01/16/21 1631 01/18/21 0400  NA 140 137  K 3.9 3.9  CL 104 102  CO2 26 25  GLUCOSE 114* 108*  BUN 9 10  CREATININE 0.71 0.62  CALCIUM 9.1 9.1   GFR: Estimated Creatinine Clearance: 98.8 mL/min (by C-G formula based on SCr of 0.62 mg/dL). Liver Function Tests: Recent Labs  Lab 01/16/21 1631  AST 12*  ALT 14  ALKPHOS 69  BILITOT 1.0  PROT 7.1  ALBUMIN 3.9   Recent Labs  Lab 01/16/21 1631  LIPASE 13   No results for input(s): AMMONIA in the last 168 hours. Coagulation Profile: Recent Labs  Lab 01/17/21 1210  INR 1.0   Cardiac Enzymes: No results for input(s): CKTOTAL, CKMB, CKMBINDEX, TROPONINI in the last 168 hours. BNP (last 3 results) No results for input(s): PROBNP in the last 8760 hours. HbA1C: Recent Labs    01/17/21 1210  HGBA1C 5.7*   CBG: No results for input(s): GLUCAP in the last 168 hours. Lipid Profile: No results for input(s): CHOL, HDL, LDLCALC, TRIG, CHOLHDL, LDLDIRECT in the last 72 hours. Thyroid Function Tests: Recent Labs    01/17/21 1210  TSH 7.737*   Anemia Panel: No results for input(s): VITAMINB12, FOLATE, FERRITIN, TIBC, IRON, RETICCTPCT in the last 72 hours. Sepsis Labs: No results for input(s): PROCALCITON, LATICACIDVEN in the last 168 hours.  Recent Results (from the past 240 hour(s))  Resp Panel by RT-PCR (Flu A&B, Covid) Nasopharyngeal Swab     Status: None   Collection Time: 01/16/21  7:11 PM   Specimen:  Nasopharyngeal Swab; Nasopharyngeal(NP) swabs in vial transport medium  Result Value Ref Range Status   SARS Coronavirus 2 by RT PCR NEGATIVE NEGATIVE Final    Comment: (NOTE) SARS-CoV-2 target nucleic acids are NOT DETECTED.  The SARS-CoV-2 RNA is generally detectable in upper respiratory specimens during the acute phase of infection. The lowest concentration of SARS-CoV-2 viral copies this assay can detect is 138 copies/mL. A negative result does not preclude SARS-Cov-2 infection and should not be used as the sole basis for treatment or other patient management decisions. A negative result may occur with  improper specimen collection/handling, submission of specimen other than nasopharyngeal swab, presence of viral mutation(s) within the areas targeted by this assay, and inadequate number of viral copies(<138 copies/mL). A negative result must be combined with clinical observations, patient history, and epidemiological information. The expected result is Negative.  Fact Sheet for Patients:  BloggerCourse.comhttps://www.fda.gov/media/152166/download  Fact Sheet for Healthcare Providers:  SeriousBroker.it  This test is no t yet approved or cleared by the [place] and  has been authorized for detection and/or diagnosis of SARS-CoV-2 by FDA under an Emergency Use Authorization (EUA). This EUA will remain  in effect (meaning this test can be used) for the duration of the COVID-19 declaration under Section 564(b)(1) of the Act, 21 U.S.C.section 360bbb-3(b)(1), unless the authorization is terminated  or revoked sooner.       Influenza A by PCR NEGATIVE NEGATIVE Final   Influenza B by PCR NEGATIVE NEGATIVE Final    Comment: (NOTE) The Xpert Xpress SARS-CoV-2/FLU/RSV plus assay is intended as an aid in the diagnosis of influenza from Nasopharyngeal swab specimens and should not be used as a sole basis for treatment. Nasal washings and aspirates are unacceptable for  Xpert Xpress SARS-CoV-2/FLU/RSV testing.  Fact Sheet for Patients: BloggerCourse.com  Fact Sheet for Healthcare Providers: SeriousBroker.it  This test is not yet approved or cleared by the [place] FDA and has been authorized for detection and/or diagnosis of SARS-CoV-2 by FDA under an Emergency Use Authorization (EUA). This EUA will remain in effect (meaning this test can be used) for the duration of the COVID-19 declaration under Section 564(b)(1) of the Act, 21 U.S.C. section 360bbb-3(b)(1), unless the authorization is terminated or revoked.  Performed at [facility]          Radiology Studies: CT ABDOMEN PELVIS WO CONTRAST  Result Date: 01/17/2021 CLINICAL DATA:  50 year old female with abdominal distension EXAM: CT ABDOMEN AND PELVIS WITHOUT CONTRAST TECHNIQUE: Multidetector CT imaging of the abdomen and pelvis was performed following the standard protocol without IV contrast. COMPARISON:  CT abdomen/pelvis yesterday 01/16/2021; MRI thoracic spine earlier today FINDINGS: Lower chest: Trace bilateral pleural effusions with associated lower lobe atelectasis. Bronchial wall thickening present in both lower lobes. Stable heart size. No pericardial effusion. Unremarkable distal thoracic esophagus. Hepatobiliary: Normal hepatic contour and morphology. No discrete hepatic lesion. High attenuation material present within the gallbladder consistent with vicarious excretion of contrast. No intra or extrahepatic biliary ductal dilatation. Pancreas: Unremarkable. No pancreatic ductal dilatation or surrounding inflammatory changes. Spleen: Normal in size without focal abnormality. Adrenals/Urinary Tract: Normal adrenal glands. Normal renal size and morphology without exophytic mass, hydronephrosis or nephrolithiasis. Excreted contrast material present within the calices and urinary bladder. The ureters are unremarkable. No  evidence of bladder mass, stone or other filling defect. Stomach/Bowel: No evidence of obstruction or focal bowel wall thickening. Normal appendix in the right lower quadrant. The terminal ileum is unremarkable. Vascular/Lymphatic: Limited evaluation in the absence of intravenous contrast. No significant atherosclerotic plaque, or aneurysm. No suspicious lymphadenopathy. Reproductive: Unremarkable uterus and right adnexa. Low-attenuation cystic structures arising from the left ovary demonstrate no change compared to yesterday. In a reproductive age female, findings remain consistent with benign ovarian cysts. Other: No abdominal wall hernia or abnormality. No abdominopelvic ascites. Musculoskeletal: Destructive changes at T8-T9 with erosion of the inferior endplate of T8 in the superior endplate of T9. Associated focal exaggerated thoracic kyphosis. Paraspinal phlegmonous changes are also again noted. There has been no significant interval progression in the short interval since prior imaging. IMPRESSION: 1. Imaging findings consistent with osteomyelitis/discitis at T8-T9 again demonstrated. 2. No acute abnormality or interval change in the abdomen or pelvis. Electronically Signed   By: Malachy Moan M.D.   On: 01/17/2021 05:22   MR THORACIC SPINE W WO CONTRAST  Result Date: 01/17/2021 CLINICAL DATA:  Initial evaluation for compression fracture. EXAM: MRI THORACIC  WITHOUT AND WITH CONTRAST TECHNIQUE: Multiplanar and multiecho pulse sequences of the thoracic spine were obtained without and with intravenous contrast. CONTRAST:  10mL GADAVIST GADOBUTROL 1 MMOL/ML IV SOLN COMPARISON:  Prior CT from 01/16/2021 FINDINGS: Alignment: Slight exaggeration of the normal thoracic kyphosis, apex at T8-9. No listhesis. Vertebrae: Abnormal edema and enhancement with loss of disc space height and endplate irregularity seen about the T8-9 interspace, consistent with acute osteomyelitis discitis. Associated height loss of up  to approximately 75% at the adjacent T9 vertebral body, with more moderate approximate 50% height loss at T8. Edema and enhancement extends into the T8-9 facets bilaterally, consistent with associated septic arthritis. Evidence for associated epidural phlegmon and enhancement within both the ventral and dorsal epidural space at the level of T8-9 (series 10, image 8). No frank epidural abscess visible on this motion degraded exam. Additionally, mild marrow edema and enhancement with disc space height loss noted about the adjacent T7-8 interspace, also concerning for acute osteomyelitis discitis as well. This is more mild in appearance as compared to the adjacent T8-9 interspace. Associated paraspinous edema and enhancement adjacent to the infected T8-9 interspace without visible soft tissue abscess. No other sites of acute infection within the thoracic spine. Vertebral body height otherwise maintained. Diffusely decreased T1 signal intensity seen throughout the visualized bone marrow, nonspecific, but most commonly related to anemia, smoking or obesity. No worrisome osseous lesions. Cord: No convincing cord signal abnormality seen on this motion degraded exam. Paraspinal and other soft tissues: Paraspinous edema and enhancement adjacent to the T8-9 interspace, consistent with infection. No discrete soft tissue collections or abscesses. Small layering bilateral pleural effusions noted. Disc levels: T8-9: Findings consistent with osteomyelitis discitis. Associated compression deformity at the superior endplate of T9 with up to 50-60% height loss and 4 mm bony retropulsion. Epidural phlegmon and enhancement within the ventral and dorsal epidural space without definite frank abscess formation. Resultant moderate spinal stenosis with mild cord flattening. No definite cord signal changes on this motion degraded exam. Moderate right worse than left foraminal narrowing. Additional fairly mild degenerative changes for age  noted elsewhere within the thoracic spine. No other significant canal or foraminal stenosis. No other neural impingement. IMPRESSION: 1. Findings consistent with acute osteomyelitis discitis and septic arthritis at T8-9. Associated compression fracture of T9 with up to 75% anterior height loss and 4 mm bony retropulsion, with 50% height loss at T8. Superimposed epidural phlegmon at this level with resultant moderate spinal stenosis and mild cord flattening, but no definite cord signal changes on this motion degraded exam. 2. Mild edema and enhancement about the adjacent T7-8 interspace, also consistent with acute osteomyelitis discitis. 3. Associated paraspinous edema and enhancement adjacent to the T8-9 interspace without discrete soft tissue abscess. 4. Small layering bilateral pleural effusions. Electronically Signed   By: Rise Mu M.D.   On: 01/17/2021 01:31   CT Abdomen Pelvis W Contrast  Result Date: 01/16/2021 CLINICAL DATA:  Upper back pain that radiates. EXAM: CT ANGIOGRAPHY CHEST CT ABDOMEN AND PELVIS WITH CONTRAST TECHNIQUE: Multidetector CT imaging of the chest was performed using the standard protocol during bolus administration of intravenous contrast. Multiplanar CT image reconstructions and MIPs were obtained to evaluate the vascular anatomy. Multidetector CT imaging of the abdomen and pelvis was performed using the standard protocol during bolus administration of intravenous contrast. CONTRAST:  OMNIPAQUE IOHEXOL 350 MG/ML SOLN COMPARISON:  None. FINDINGS: CTA CHEST FINDINGS Cardiovascular: Noncontrast imaging demonstrates no intramural hematoma within the thoracic aorta. Contrast enhanced imaging  demonstrates no aortic dissection or aneurysm. Great vessels are normal. No pericardial fluid. The main pulmonary artery is prominent at 40 mm. No filling defects within the pulmonary arteries identified. Mediastinum/Nodes: No axillary or supraclavicular adenopathy. No mediastinal or  hilar adenopathy. No pericardial fluid. Esophagus normal. Lungs/Pleura: low lung volumes. Small bilateral pleural effusions. Bibasilar passive atelectasis. There is interlobular septal thickening consistent with interstitial edema. No pneumothorax. No pneumonia or infarction. Musculoskeletal: There is a compression deformity superior endplate of the T9 vertebral body with focal kyphosis. There is loss of cortical definition at this level. Additionally there is soft tissue thickening in the paravertebral space at this level (image 88/3 axial and sagittal image 96/series 7). Review of the MIP images confirms the above findings. CT ABDOMEN and PELVIS FINDINGS Hepatobiliary: No focal hepatic lesion. No biliary duct dilatation. Common bile duct is normal. Pancreas: Pancreas is normal. No ductal dilatation. No pancreatic inflammation. Spleen: Normal spleen Adrenals/urinary tract: Adrenal glands and kidneys are normal. The ureters and bladder normal. Stomach/Bowel: Stomach, small-bowel and cecum are normal. The appendix is not identified but there is no pericecal inflammation to suggest appendicitis. The colon and rectosigmoid colon are normal. Vascular/Lymphatic: Abdominal aorta is normal caliber. No periportal or retroperitoneal adenopathy. No pelvic adenopathy. Reproductive: Uterus and ovaries normal. Other: No free fluid. Musculoskeletal: No aggressive osseous lesion. Review of the MIP images confirms the above findings. IMPRESSION: Chest Impression: 1. Loss of superior endplate height at T9 with associated inflammatory process in the perivertebral soft tissue. Findings concerning for discitis - osteomyelitis. RECOMMEND MRI of the thoracic spine without with contrast. 2. No evidence of aortic dissection or aneurysm. 3. Prominent main pulmonary artery suggest pulmonary hypertension. 4. Low lung volumes Abdomen / Pelvis Impression: 1. No acute findings in the abdomen pelvis. Findings conveyed toJOSHUA HONG on 01/16/2021   at19:02. Electronically Signed   By: Genevive Bi M.D.   On: 01/16/2021 19:02   DG Chest Port 1 View  Result Date: 01/17/2021 CLINICAL DATA:  Pain EXAM: PORTABLE CHEST 1 VIEW COMPARISON:  01/16/2021 FINDINGS: The heart size is enlarged. There are areas of atelectasis at the lung bases. There is vascular congestion. There are probable small bilateral pleural effusions. There is no pneumothorax. IMPRESSION: Cardiomegaly with vascular congestion and probable small bilateral pleural effusions. Electronically Signed   By: Katherine Mantle M.D.   On: 01/17/2021 03:28   CT Angio Chest Aorta w/CM &/OR wo/CM  Result Date: 01/16/2021 CLINICAL DATA:  Upper back pain that radiates. EXAM: CT ANGIOGRAPHY CHEST CT ABDOMEN AND PELVIS WITH CONTRAST TECHNIQUE: Multidetector CT imaging of the chest was performed using the standard protocol during bolus administration of intravenous contrast. Multiplanar CT image reconstructions and MIPs were obtained to evaluate the vascular anatomy. Multidetector CT imaging of the abdomen and pelvis was performed using the standard protocol during bolus administration of intravenous contrast. CONTRAST:  OMNIPAQUE IOHEXOL 350 MG/ML SOLN COMPARISON:  None. FINDINGS: CTA CHEST FINDINGS Cardiovascular: Noncontrast imaging demonstrates no intramural hematoma within the thoracic aorta. Contrast enhanced imaging demonstrates no aortic dissection or aneurysm. Great vessels are normal. No pericardial fluid. The main pulmonary artery is prominent at 40 mm. No filling defects within the pulmonary arteries identified. Mediastinum/Nodes: No axillary or supraclavicular adenopathy. No mediastinal or hilar adenopathy. No pericardial fluid. Esophagus normal. Lungs/Pleura: low lung volumes. Small bilateral pleural effusions. Bibasilar passive atelectasis. There is interlobular septal thickening consistent with interstitial edema. No pneumothorax. No pneumonia or infarction. Musculoskeletal: There is  a compression deformity superior endplate of  the T9 vertebral body with focal kyphosis. There is loss of cortical definition at this level. Additionally there is soft tissue thickening in the paravertebral space at this level (image 88/3 axial and sagittal image 96/series 7). Review of the MIP images confirms the above findings. CT ABDOMEN and PELVIS FINDINGS Hepatobiliary: No focal hepatic lesion. No biliary duct dilatation. Common bile duct is normal. Pancreas: Pancreas is normal. No ductal dilatation. No pancreatic inflammation. Spleen: Normal spleen Adrenals/urinary tract: Adrenal glands and kidneys are normal. The ureters and bladder normal. Stomach/Bowel: Stomach, small-bowel and cecum are normal. The appendix is not identified but there is no pericecal inflammation to suggest appendicitis. The colon and rectosigmoid colon are normal. Vascular/Lymphatic: Abdominal aorta is normal caliber. No periportal or retroperitoneal adenopathy. No pelvic adenopathy. Reproductive: Uterus and ovaries normal. Other: No free fluid. Musculoskeletal: No aggressive osseous lesion. Review of the MIP images confirms the above findings. IMPRESSION: Chest Impression: 1. Loss of superior endplate height at T9 with associated inflammatory process in the perivertebral soft tissue. Findings concerning for discitis - osteomyelitis. RECOMMEND MRI of the thoracic spine without with contrast. 2. No evidence of aortic dissection or aneurysm. 3. Prominent main pulmonary artery suggest pulmonary hypertension. 4. Low lung volumes Abdomen / Pelvis Impression: 1. No acute findings in the abdomen pelvis. Findings conveyed toJOSHUA HONG on 01/16/2021  at19:02. Electronically Signed   By: Genevive Bi M.D.   On: 01/16/2021 19:02   [place] EKG SITE RITE  Result Date: 01/17/2021 If Site Rite image not attached, placement could not be confirmed due to current cardiac rhythm.       Scheduled Meds: . docusate sodium  100 mg Oral BID  .  gabapentin  100 mg Oral TID  . sodium chloride flush  3 mL Intravenous Q12H   Continuous Infusions:   LOS: 1 day    Time spent:25 mins. More than 50% of that time was spent in counseling and/or coordination of care.      Burnadette Pop, MD [facility] P4/02/2021, 9:10 AM

## 2021-01-18 NOTE — Progress Notes (Signed)
[facility]  Date of Admission:  01/16/2021     Total days of antibiotics 1         ASSESSMENT:  Stacey Kaiser has continued back pain and awaiting IR attempted aspiration to obtain cultures to guide antibiotic treatment. She is very anxious about having to lie on her stomach due to pain. Continue to hold antibiotics until IR procedure completed, then vancomycin and ceftriaxone. Blood cultures from 4/3 have been without growth thus far. Pain management per primary team.   PLAN:  1. Continue to hold antibiotics pending IR procedure. 2. After procedure start vancomycin and ceftriaxone.  3. Pain management per primary team.   Principal Problem:   Discitis Active Problems:   Morbid obesity with BMI of 50.0-59.9, adult (HCC)   . docusate sodium  100 mg Oral BID  . gabapentin  300 mg Oral TID  .  HYDROmorphone (DILAUDID) injection  1 mg Intravenous Once  . sodium chloride flush  3 mL Intravenous Q12H    SUBJECTIVE:  Afebrile overnight with increased pain with movement. Anxious about radiology procedure and potential of lying flat on her stomach.   Allergies  Allergen Reactions  . Okra Hives  . Other     Bees and wasps     Review of Systems: Review of Systems  Constitutional: Negative for chills, fever and weight loss.  Respiratory: Negative for cough, shortness of breath and wheezing.   Cardiovascular: Negative for chest pain and leg swelling.  Gastrointestinal: Negative for abdominal pain, constipation, diarrhea, nausea and vomiting.  Musculoskeletal: Positive for back pain.  Skin: Negative for rash.  Psychiatric/Behavioral: The patient is nervous/anxious.       OBJECTIVE: Vitals:   01/17/21 1453 01/17/21 1948 01/18/21 0451 01/18/21 0813  BP: 130/66 139/80 122/78 131/80  Pulse: 70 75 79 81  Resp: 18 20 17 18   Temp: 98.3 F (36.8 C) 99.1 F (37.3 C) 98.3 F (36.8 C) 99.8 F (37.7 C)  TempSrc: Oral Oral Oral Oral  SpO2:  95% 98% 98% 96%  Weight:      Height:       Body mass index is 57.48 kg/m.  Physical Exam Constitutional:      General: She is not in acute distress.    Appearance: She is well-developed. She is obese.  Cardiovascular:     Rate and Rhythm: Normal rate and regular rhythm.     Heart sounds: Normal heart sounds.  Pulmonary:     Effort: Pulmonary effort is normal.     Breath sounds: Normal breath sounds.  Skin:    General: Skin is warm and dry.  Neurological:     Mental Status: She is alert and oriented to person, place, and time.  Psychiatric:        Mood and Affect: Mood is anxious.     Lab Results Lab Results  Component Value Date   WBC 11.6 (H) 01/18/2021   HGB 11.8 (L) 01/18/2021   HCT 37.2 01/18/2021   MCV 95.4 01/18/2021   PLT 372 01/18/2021    Lab Results  Component Value Date   CREATININE 0.62 01/18/2021   BUN 10 01/18/2021   NA 137 01/18/2021   K 3.9 01/18/2021   CL 102 01/18/2021   CO2 25 01/18/2021    Lab Results  Component Value Date   ALT 14 01/16/2021   AST 12 (L) 01/16/2021   ALKPHOS 69 01/16/2021   BILITOT 1.0 01/16/2021     Microbiology: Recent  Results (from the past 240 hour(s))  Resp Panel by RT-PCR (Flu A&B, Covid) Nasopharyngeal Swab     Status: None   Collection Time: 01/16/21  7:11 PM   Specimen: Nasopharyngeal Swab; Nasopharyngeal(NP) swabs in vial transport medium  Result Value Ref Range Status   SARS Coronavirus 2 by RT PCR NEGATIVE NEGATIVE Final    Comment: (NOTE) SARS-CoV-2 target nucleic acids are NOT DETECTED.  The SARS-CoV-2 RNA is generally detectable in upper respiratory specimens during the acute phase of infection. The lowest concentration of SARS-CoV-2 viral copies this assay can detect is 138 copies/mL. A negative result does not preclude SARS-Cov-2 infection and should not be used as the sole basis for treatment or other patient management decisions. A negative result may occur with  improper specimen  collection/handling, submission of specimen other than nasopharyngeal swab, presence of viral mutation(s) within the areas targeted by this assay, and inadequate number of viral copies(<138 copies/mL). A negative result must be combined with clinical observations, patient history, and epidemiological information. The expected result is Negative.  Fact Sheet for Patients:  BloggerCourse.com  Fact Sheet for Healthcare Providers:  SeriousBroker.it  This test is no t yet approved or cleared by the [place] FDA and  has been authorized for detection and/or diagnosis of SARS-CoV-2 by FDA under an Emergency Use Authorization (EUA). This EUA will remain  in effect (meaning this test can be used) for the duration of the COVID-19 declaration under Section 564(b)(1) of the Act, 21 U.S.C.section 360bbb-3(b)(1), unless the authorization is terminated  or revoked sooner.       Influenza A by PCR NEGATIVE NEGATIVE Final   Influenza B by PCR NEGATIVE NEGATIVE Final    Comment: (NOTE) The Xpert Xpress SARS-CoV-2/FLU/RSV plus assay is intended as an aid in the diagnosis of influenza from Nasopharyngeal swab specimens and should not be used as a sole basis for treatment. Nasal washings and aspirates are unacceptable for Xpert Xpress SARS-CoV-2/FLU/RSV testing.  Fact Sheet for Patients: BloggerCourse.com  Fact Sheet for Healthcare Providers: SeriousBroker.it  This test is not yet approved or cleared by the [place] FDA and has been authorized for detection and/or diagnosis of SARS-CoV-2 by FDA under an Emergency Use Authorization (EUA). This EUA will remain in effect (meaning this test can be used) for the duration of the COVID-19 declaration under Section 564(b)(1) of the Act, 21 U.S.C. section 360bbb-3(b)(1), unless the authorization is terminated or revoked.  Performed at [facility]   Blood culture (routine x 2)     Status: None (Preliminary result)   Collection Time: 01/16/21 11:37 PM   Specimen: BLOOD  Result Value Ref Range Status   Specimen Description BLOOD RIGHT ANTECUBITAL  Final   Special Requests   Final    BOTTLES DRAWN AEROBIC AND ANAEROBIC Blood Culture results may not be optimal due to an inadequate volume of blood received in culture bottles   Culture   Final    NO GROWTH 1 DAY Performed at [facility], [address] [place], [place] [address]    Report Status PENDING  Incomplete  Blood culture (routine x 2)     Status: None (Preliminary result)   Collection Time: 01/16/21 11:40 PM   Specimen: BLOOD  Result Value Ref Range Status   Specimen Description BLOOD RIGHT ANTECUBITAL  Final   Special Requests   Final    BOTTLES DRAWN AEROBIC AND ANAEROBIC Blood Culture results may not be optimal due to an inadequate volume  of blood received in culture bottles   Culture   Final    NO GROWTH 1 DAY Performed at [facility], [address], [place], [place] [address]    Report Status PENDING  Incomplete     Marcos Eke, NP [facility]  01/18/2021  10:09 AM

## 2021-01-18 NOTE — Procedures (Signed)
S/P T9 core biopsies x 3 under fluoro guidance. General anesthesia.  T8-T9 disc space inaccessible due to degenerative overhanging osteophytes..  Patient extubated. Moving all 4s equally Follows simple instructions appropriately.   S.Franklyn Cafaro MD

## 2021-01-18 NOTE — Progress Notes (Signed)
Pt unable to tolerate procedure with moderate sedation. Will attempt to schedule with general anesthesia.

## 2021-01-19 ENCOUNTER — Inpatient Hospital Stay (HOSPITAL_COMMUNITY): Payer: Self-pay

## 2021-01-19 ENCOUNTER — Encounter (HOSPITAL_COMMUNITY): Payer: Self-pay | Admitting: Radiology

## 2021-01-19 DIAGNOSIS — I509 Heart failure, unspecified: Secondary | ICD-10-CM

## 2021-01-19 DIAGNOSIS — I340 Nonrheumatic mitral (valve) insufficiency: Secondary | ICD-10-CM

## 2021-01-19 DIAGNOSIS — I361 Nonrheumatic tricuspid (valve) insufficiency: Secondary | ICD-10-CM

## 2021-01-19 LAB — SURGICAL PATHOLOGY

## 2021-01-19 LAB — T3, FREE: T3, Free: 2.5 pg/mL (ref 2.0–4.4)

## 2021-01-19 LAB — ECHOCARDIOGRAM COMPLETE
Area-P 1/2: 4.29 cm2
Height: 58 in
S' Lateral: 2.7 cm
Weight: 4400 oz

## 2021-01-19 LAB — CULTURE, BLOOD (ROUTINE X 2): Culture: NO GROWTH

## 2021-01-19 IMAGING — DX DG CHEST 1V PORT
1 series · 1 of 1 positions shown · non-contrast
Comparison: Chest x-ray [DATE].  CT chest [DATE].

CLINICAL DATA: Chest pain.

EXAM:
PORTABLE CHEST 1 VIEW

[chest]
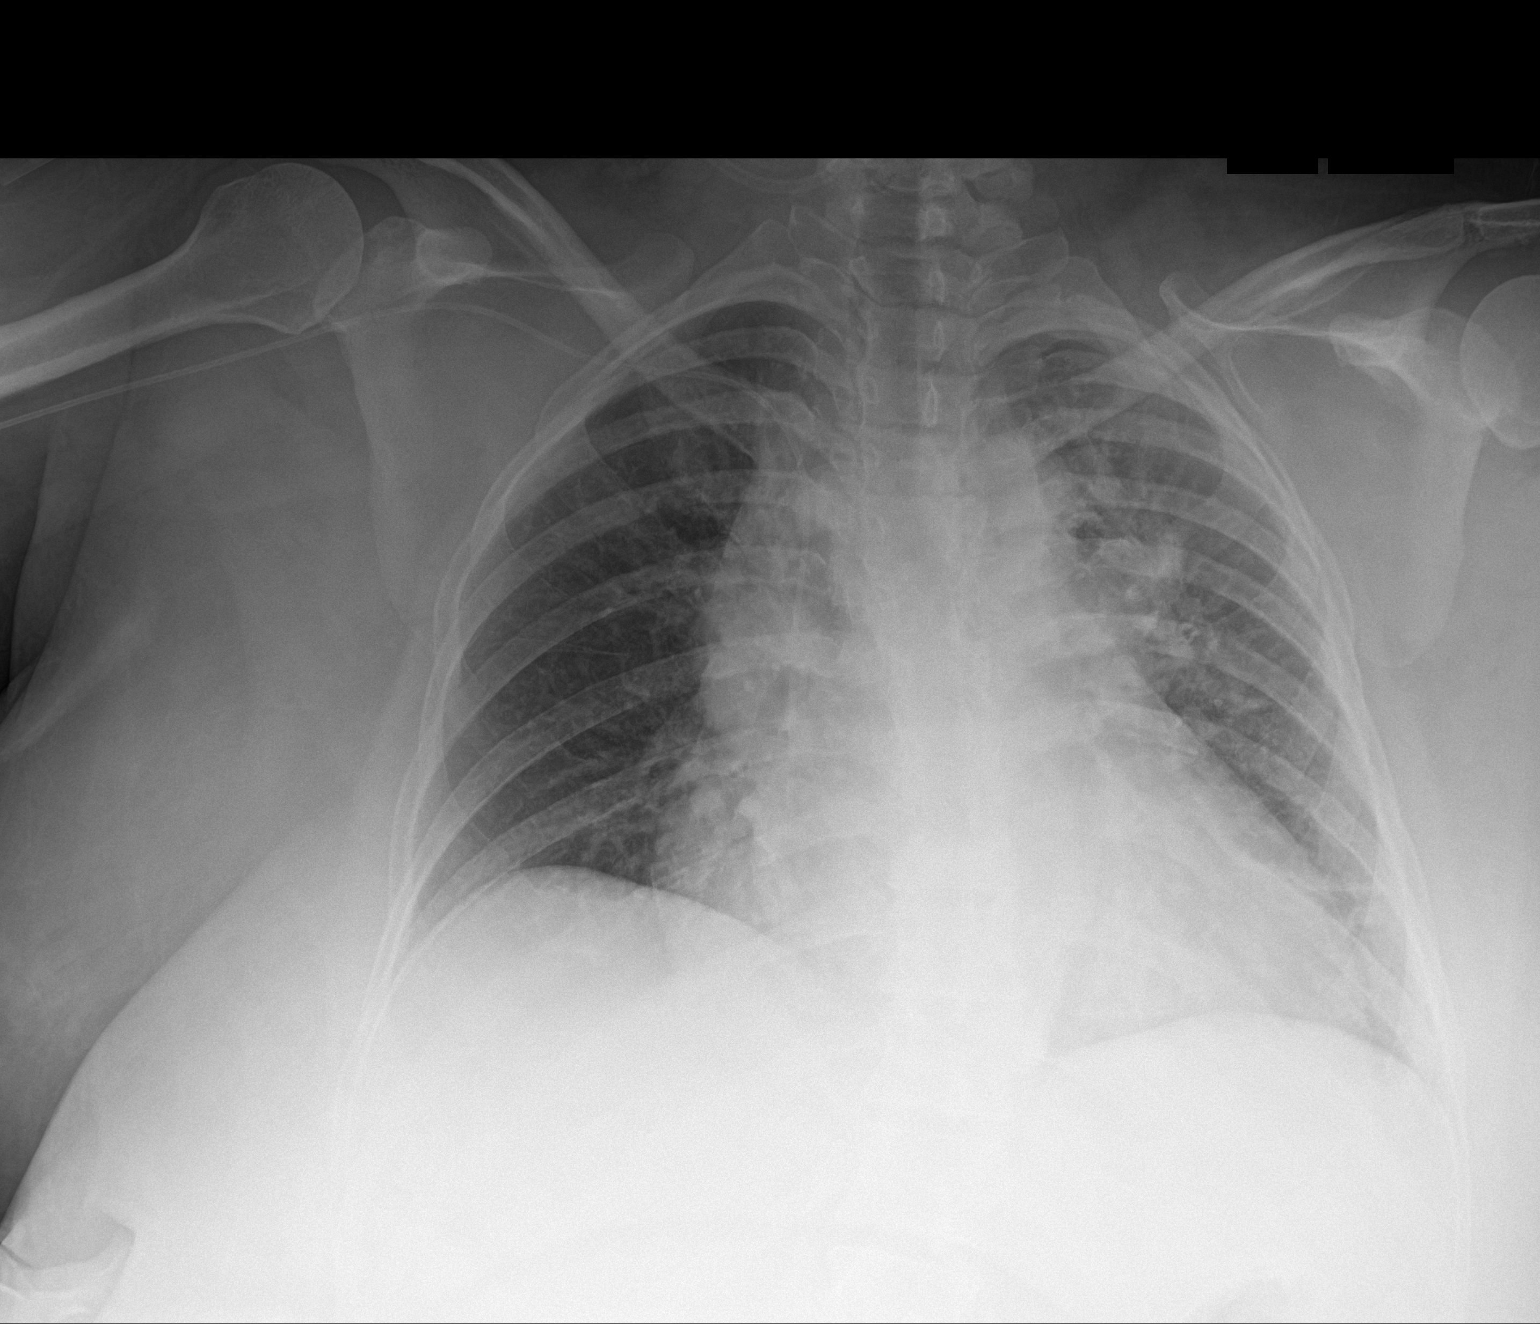

[1 of 1 positions shown; findings below may reference images not displayed]

FINDINGS: Cardiomegaly with pulmonary vascular prominence again noted. Mild
left base atelectasis. No focal infiltrate. No pleural effusion or
pneumothorax.
IMPRESSION: Cardiomegaly with pulmonary vascular prominence noted. Mild left
base atelectasis. No focal infiltrate.

## 2021-01-19 MED ORDER — FENTANYL 25 MCG/HR TD PT72
1.0000 | MEDICATED_PATCH | TRANSDERMAL | Status: DC
  Administered 2021-01-19: 1 via TRANSDERMAL
  Filled 2021-01-19 (×2): qty 1

## 2021-01-19 MED ORDER — CHLORHEXIDINE GLUCONATE CLOTH 2 % EX PADS
6.0000 | MEDICATED_PAD | Freq: Every day | CUTANEOUS | Status: DC
  Administered 2021-01-19 – 2021-01-24 (×5): 6 via TOPICAL

## 2021-01-19 MED ORDER — LIDOCAINE 5 % EX PTCH
1.0000 | MEDICATED_PATCH | CUTANEOUS | Status: DC
  Administered 2021-01-19 – 2021-01-24 (×6): 1 via TRANSDERMAL
  Filled 2021-01-19 (×6): qty 1

## 2021-01-19 MED ORDER — VANCOMYCIN HCL 1.25 G IV SOLR
1250.0000 mg | INTRAVENOUS | Status: DC
  Administered 2021-01-20 – 2021-01-21 (×2): 1250 mg via INTRAVENOUS
  Filled 2021-01-19 (×4): qty 1250

## 2021-01-19 MED ORDER — VANCOMYCIN HCL 10 G IV SOLR
2500.0000 mg | Freq: Once | INTRAVENOUS | Status: AC
  Administered 2021-01-19: 2500 mg via INTRAVENOUS
  Filled 2021-01-19: qty 2500

## 2021-01-19 MED ORDER — SODIUM CHLORIDE 0.9 % IV SOLN
2.0000 g | INTRAVENOUS | Status: DC
  Administered 2021-01-19 – 2021-01-24 (×6): 2 g via INTRAVENOUS
  Filled 2021-01-19 (×6): qty 20

## 2021-01-19 MED ORDER — GABAPENTIN 400 MG PO CAPS
400.0000 mg | ORAL_CAPSULE | Freq: Three times a day (TID) | ORAL | Status: DC
  Administered 2021-01-19 – 2021-01-21 (×6): 400 mg via ORAL
  Filled 2021-01-19 (×6): qty 1

## 2021-01-19 MED ORDER — SODIUM CHLORIDE 0.9% FLUSH: 10.0000 mL | INTRAVENOUS | Status: DC | PRN

## 2021-01-19 MED ORDER — SODIUM CHLORIDE 0.9% FLUSH
10.0000 mL | Freq: Two times a day (BID) | INTRAVENOUS | Status: DC
  Administered 2021-01-19 – 2021-01-21 (×4): 10 mL

## 2021-01-19 NOTE — Anesthesia Postprocedure Evaluation (Signed)
Anesthesia Post Note  Patient: Stacey Kaiser  Procedure(s) Performed: IR WITH ANESTHESIA (N/A )     Patient location during evaluation: PACU Anesthesia Type: General Level of consciousness: awake Pain management: pain level controlled Vital Signs Assessment: post-procedure vital signs reviewed and stable Respiratory status: spontaneous breathing Cardiovascular status: stable Postop Assessment: no apparent nausea or vomiting Anesthetic complications: no   No complications documented.  Last Vitals:  Vitals:   01/19/21 1828 01/19/21 2029  BP:  129/78  Pulse:  95  Resp:  18  Temp:  36.8 C  SpO2: 92% 98%    Last Pain:  Vitals:   01/19/21 2029  TempSrc: Oral  PainSc:                  Annisha Baar

## 2021-01-19 NOTE — Progress Notes (Signed)
[facility]  Date of Admission:  01/16/2021     Total days of antibiotics 1         ASSESSMENT:  Ms. Arenz underwent core biopsy with pathology showing no acute osteomyelitis in the specimen provided. Aerobic/anaerobic cultures were unable to be unable to be obtained secondary to degenerative overhanging osteophytes in the T8-T9 disc space. Blood cultures have been without growth to date. PICC line placed this morning and started on broad spectrum antibiotics with plan for 6 weeks of therapy with vancomycin and ceftriaxone. OPAT orders placed and home health orders below. Continue pain management per primary team. Will arrange follow up in the ID office.   PLAN:  1. Continue current dose of vancomycin and ceftriaxone. 2. Therapeutic drug monitoring per protocol of vancomycin trough and renal function. 3. OPAT and Home Health orders.  4. Pain management per primary team. 5. Arrange follow up in ID office.   Diagnosis: Diskitis   Culture Result: Culture negative  Allergies  Allergen Reactions  . Okra Hives  . Other     Bees and wasps    OPAT Orders Discharge antibiotics to be given via PICC line Discharge antibiotics: Vancomycin and ceftriaxone Per pharmacy protocol  Aim for Vancomycin trough 15-20 or AUC 400-550 (unless otherwise indicated) Duration: 6 weeks End Date: 03/02/21  [facility] Care Per Protocol:  Home health RN for IV administration and teaching; PICC line care and labs.    Labs weekly while on IV antibiotics: _X_ CBC with differential _X_ BMP (BIWEEKLY) __ CMP _X_ CRP _X_ ESR _X_ Vancomycin trough __ CK  __ Please pull PIC at completion of IV antibiotics _X_ Please leave PIC in place until doctor has seen patient or been notified  Fax weekly labs to [phone]  Clinic Follow Up Appt:  02/09/21 at 3:00pm with Terri Piedra, NP    Principal Problem:   Discitis Active Problems:   Morbid obesity with BMI of  50.0-59.9, adult ([place])   . Chlorhexidine Gluconate Cloth  6 each Topical Daily  . docusate sodium  100 mg Oral BID  . fentaNYL  1 patch Transdermal Q72H  . gabapentin  400 mg Oral TID  . lidocaine  1 patch Transdermal Q24H  . multivitamin with minerals  1 tablet Oral Daily  . Ensure Max Protein  11 oz Oral BID  . sodium chloride flush  10-40 mL Intracatheter Q12H  . sodium chloride flush  3 mL Intravenous Q12H    SUBJECTIVE:  Afebrile overnight with no acute events. Continues to have back pain.   Allergies  Allergen Reactions  . Okra Hives  . Other     Bees and wasps     Review of Systems: Review of Systems  Constitutional: Negative for chills, fever and weight loss.  Respiratory: Negative for cough, shortness of breath and wheezing.   Cardiovascular: Negative for chest pain and leg swelling.  Gastrointestinal: Negative for abdominal pain, constipation, diarrhea, nausea and vomiting.  Musculoskeletal: Positive for back pain.  Skin: Negative for rash.      OBJECTIVE: Vitals:   01/18/21 2011 01/19/21 0000 01/19/21 0400 01/19/21 0700  BP: (!) 180/96 (!) 156/73 (!) 142/88 (!) 169/83  Pulse: 96 88 92 98  Resp: _0 Temp: 98.9 F (37.2 C) 98.5 F (36.9 C) 98.9 F (37.2 C) 99 F (37.2 C)  TempSrc: Oral Oral Oral Oral  SpO2: 91% 98% 100% 99%  Weight:      Height:  Body mass index is 57.48 kg/m.  Physical Exam Constitutional:      General: She is not in acute distress.    Appearance: She is well-developed. She is obese.  Cardiovascular:     Rate and Rhythm: Normal rate and regular rhythm.     Heart sounds: Normal heart sounds.  Pulmonary:     Effort: Pulmonary effort is normal.     Breath sounds: Normal breath sounds.  Skin:    General: Skin is warm and dry.  Neurological:     Mental Status: She is alert and oriented to person, place, and time.  Psychiatric:        Behavior: Behavior normal.        Thought Content: Thought content normal.         Judgment: Judgment normal.     Lab Results Lab Results  Component Value Date   WBC 11.6 (H) 01/18/2021   HGB 11.8 (L) 01/18/2021   HCT 37.2 01/18/2021   MCV 95.4 01/18/2021   PLT 372 01/18/2021    Lab Results  Component Value Date   CREATININE 0.62 01/18/2021   BUN 10 01/18/2021   NA 137 01/18/2021   K 3.9 01/18/2021   CL 102 01/18/2021   CO2 25 01/18/2021    Lab Results  Component Value Date   ALT 14 01/16/2021   AST 12 (L) 01/16/2021   ALKPHOS 69 01/16/2021   BILITOT 1.0 01/16/2021     Microbiology: Recent Results (from the past 240 hour(s))  Resp Panel by RT-PCR (Flu A&B, Covid) Nasopharyngeal Swab     Status: None   Collection Time: 01/16/21  7:11 PM   Specimen: Nasopharyngeal Swab; Nasopharyngeal(NP) swabs in vial transport medium  Result Value Ref Range Status   SARS Coronavirus 2 by RT PCR NEGATIVE NEGATIVE Final    Comment: (NOTE) SARS-CoV-2 target nucleic acids are NOT DETECTED.  The SARS-CoV-2 RNA is generally detectable in upper respiratory specimens during the acute phase of infection. The lowest concentration of SARS-CoV-2 viral copies this assay can detect is 138 copies/mL. A negative result does not preclude SARS-Cov-2 infection and should not be used as the sole basis for treatment or other patient management decisions. A negative result may occur with  improper specimen collection/handling, submission of specimen other than nasopharyngeal swab, presence of viral mutation(s) within the areas targeted by this assay, and inadequate number of viral copies(<138 copies/mL). A negative result must be combined with clinical observations, patient history, and epidemiological information. The expected result is Negative.  Fact Sheet for Patients:  EntrepreneurPulse.com.au  Fact Sheet for Healthcare Providers:  IncredibleEmployment.be  This test is no t yet approved or cleared by the [place] FDA and  has  been authorized for detection and/or diagnosis of SARS-CoV-2 by FDA under an Emergency Use Authorization (EUA). This EUA will remain  in effect (meaning this test can be used) for the duration of the COVID-19 declaration under Section 564(b)(1) of the Act, 21 U.S.C.section 360bbb-3(b)(1), unless the authorization is terminated  or revoked sooner.       Influenza A by PCR NEGATIVE NEGATIVE Final   Influenza B by PCR NEGATIVE NEGATIVE Final    Comment: (NOTE) The Xpert Xpress SARS-CoV-2/FLU/RSV plus assay is intended as an aid in the diagnosis of influenza from Nasopharyngeal swab specimens and should not be used as a sole basis for treatment. Nasal washings and aspirates are unacceptable for Xpert Xpress SARS-CoV-2/FLU/RSV testing.  Fact Sheet for Patients: EntrepreneurPulse.com.au  Fact Sheet for Healthcare  Providers: IncredibleEmployment.be  This test is not yet approved or cleared by the [place] and has been authorized for detection and/or diagnosis of SARS-CoV-2 by FDA under an Emergency Use Authorization (EUA). This EUA will remain in effect (meaning this test can be used) for the duration of the COVID-19 declaration under Section 564(b)(1) of the Act, 21 U.S.C. section 360bbb-3(b)(1), unless the authorization is terminated or revoked.  Performed at [facility]   Blood culture (routine x 2)     Status: None (Preliminary result)   Collection Time: 01/16/21 11:37 PM   Specimen: BLOOD  Result Value Ref Range Status   Specimen Description BLOOD RIGHT ANTECUBITAL  Final   Special Requests   Final    BOTTLES DRAWN AEROBIC AND ANAEROBIC Blood Culture results may not be optimal due to an inadequate volume of blood received in culture bottles   Culture   Final    NO GROWTH 1 DAY Performed at [facility], [place] [address], [place], [place] [address]    Report Status PENDING  Incomplete  Blood culture (routine  x 2)     Status: None (Preliminary result)   Collection Time: 01/16/21 11:40 PM   Specimen: BLOOD  Result Value Ref Range Status   Specimen Description BLOOD RIGHT ANTECUBITAL  Final   Special Requests   Final    BOTTLES DRAWN AEROBIC AND ANAEROBIC Blood Culture results may not be optimal due to an inadequate volume of blood received in culture bottles   Culture   Final    NO GROWTH 1 DAY Performed at [facility], [place] [address] [place], [place] [address]    Report Status PENDING  Incomplete     Terri Piedra, NP [facility]  01/19/2021  11:16 AM

## 2021-01-19 NOTE — Progress Notes (Signed)
Pharmacy Antibiotic Note  Stacey Kaiser is a 50 y.o. female admitted on 01/16/2021 with discitis. She is s/p IR aspiration. Disc space was inaccessible so unfortunately cultures were not able to be obtained. Pharmacy has been consulted for empiric vancomycin dosing.  Pt is afebrile, WBC 11.6. Estimated vancomycin AUC: 495. Renal function is stable with SCr 0.6-0.7. BCx showing no growth to date. OPAT orders are entered.  Plan: Give Vancomycin 2,500mg  IV loading dose x1 Initiate Vancomycin 1,250mg  IV every 24 hours.  Goal trough 15-20 mcg/mL Initiate Ceftriaxone 2g IV every 24 hours Monitor renal function and clinical progress Obtain peak/trough levels when appropriate   Height: 4\' 10"  (147.3 cm) Weight: 124.7 kg (275 lb) IBW/kg (Calculated) : 40.9  Temp (24hrs), Avg:98.3 F (36.8 C), Min:97 F (36.1 C), Max:99 F (37.2 C)  Recent Labs  Lab 01/16/21 1631 01/18/21 0400  WBC 11.3* 11.6*  CREATININE 0.71 0.62    Estimated Creatinine Clearance: 98.8 mL/min (by C-G formula based on SCr of 0.62 mg/dL).    Allergies  Allergen Reactions  . Okra Hives  . Other     Bees and wasps    Antimicrobials this admission: Ceftriaxone 4/6 >>  Vancomycin 4/6 >>   Microbiology results: 4/3 BCx: ngtd x2  Thank you for allowing pharmacy to be a part of this patient's care.  6/3, PharmD PGY1 Acute Care Pharmacy Resident Please refer to [facility] for unit-specific pharmacist

## 2021-01-19 NOTE — Progress Notes (Signed)
Peripherally Inserted Central Catheter Placement  The IV Nurse has discussed with the patient and/or persons authorized to consent for the patient, the purpose of this procedure and the potential benefits and risks involved with this procedure.  The benefits include less needle sticks, lab draws from the catheter, and the patient may be discharged home with the catheter. Risks include, but not limited to, infection, bleeding, blood clot (thrombus formation), and puncture of an artery; nerve damage and irregular heartbeat and possibility to perform a PICC exchange if needed/ordered by physician.  Alternatives to this procedure were also discussed.  Bard Power PICC patient education guide, fact sheet on infection prevention and patient information card has been provided to patient /or left at bedside.    PICC Placement Documentation  PICC Single Lumen 01/19/21 PICC Right Cephalic 40 cm 1 cm (Active)  Indication for Insertion or Continuance of Line Home intravenous therapies (PICC only) 01/19/21 0900  Exposed Catheter (cm) 1 cm 01/19/21 0900  Site Assessment Clean;Dry;Intact 01/19/21 0900  Line Status Flushed;Saline locked;Blood return noted 01/19/21 0900  Dressing Type Transparent;Securing device 01/19/21 0900  Dressing Status Clean;Dry;Intact 01/19/21 0900  Antimicrobial disc in place? Yes 01/19/21 0900  Safety Lock Not Applicable 01/19/21 0900  Line Care Connections checked and tightened 01/19/21 0900  Dressing Intervention New dressing 01/19/21 0900  Dressing Change Due 01/26/21 01/19/21 0900       Franne Grip Renee 01/19/2021, 9:42 AM

## 2021-01-19 NOTE — Progress Notes (Signed)
PHARMACY CONSULT NOTE FOR:  OUTPATIENT  PARENTERAL ANTIBIOTIC THERAPY (OPAT)  Indication: discitis  Regimen: vancomycin 1,250mg  IV every 24 hours + ceftriaxone 2g IV every 24 hours  End date: 03/02/2021   IV antibiotic discharge orders are pended. To discharging provider:  please sign these orders via discharge navigator,  Select New Orders & click on the button choice - Manage This Unsigned Work.     Thank you for allowing pharmacy to be a part of this patient's care.  Yvetta Coder, PharmD PGY1 Acute Care Pharmacy Resident

## 2021-01-19 NOTE — Progress Notes (Signed)
PROGRESS NOTE    Stacey Kaiser  LYY:[id number] DOB: 28-Aug-1971 DOA: 01/16/2021 PCP: Patient, No Pcp Per (Inactive)   Chief Complain: Back pain  Brief Narrative: Patient is a 50 year old female with history of morbid obesity with BMI of 57 who presents with severe back pain.  She was seen and treated with different medications for back pain as an outpatient but it did not resolve so she presented to the emergency department.  CT imagings showed discitis and compression fracture at T8.  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  ID/IR/neurosurgery consulted.  Underwent disc biopsy by IR on 01/18/21. Started on IV antibiotics.  Cultures have been sent.  Hospital course remarkable for severe back pain with radiation to the right lower chest.  Assessment & Plan:   Principal Problem:   Discitis Active Problems:   Morbid obesity with BMI of 50.0-59.9, adult (HCC)   Discitis/osteomyelitis: Imagings showed discitis/osteomyelitis on T8-T9 with compression fracture of T9 and also presence of epidural phlegmon.  ID/IR/neurosurgery consulted.  Underwent  T9 core biopsies  on 01/18/21 under general anesthesia .  Blood Cultures have been sent, will follow cultures.  We will also follow biopsy report Has been started on IV antibiotics now.  ID recommended to continue vancomycin and ceftriaxone for 6 weeks and follow-up as an outpatient. Currently she is afebrile, has mild  Leukocytosis. Presented with severe back pain but her pain is migrating and today she was complaining of severe pain on her right lower chest.  Continues to cry, morning. Continue pain management, supportive care. Continue aggressive pain management.  Currently on Dilaudid, oxycodone, fentanyl, Robaxin. We will request a PT/OT evaluation.  Abdominal distention: Severely distended abdomen but denies any abdominal pain.  Abdominal imaging she did not show any  intra-abdominal or intrapelvic pathology.  Morbid obesity: BMI 57.48.  Weight loss, healthy diet encouraged  Elevated TSH: Mild elevated TSH of 7.7.  Normal free T4 and T3.  Will not start on Synthyroid.  Uninsured: TOC consulted  Respiratory insufficiency: Currently on 2 to 3 days of nasal cannula oxygen.  No history of respiratory disease.  Chest x-ray some cardiomegaly with congestion and small bilateral pleural effusion.   Lungs are clear on auscultation.    Nutrition Problem: Increased nutrient needs Etiology: acute illness      DVT prophylaxis:Lovenox Code Status: Full Family Communication: None at the bedside Status is: Inpatient  Remains inpatient appropriate because:Inpatient level of care appropriate due to severity of illness   Dispo: The patient is from: Home              Anticipated d/c is to: Home vs SNF              Patient currently is not medically stable to d/c.   Difficult to place patient No    Consultants: IR, neurosurgery, ID  Procedures:Plan doe disc aspiration  Antimicrobials:  Anti-infectives (From admission, onward)   Start     Dose/Rate Route Frequency Ordered Stop   01/17/21 0400  vancomycin (VANCOCIN) 2,500 mg in sodium chloride 0.9 % 500 mL IVPB        2,500 mg 250 mL/hr over 120 Minutes Intravenous  Once 01/17/21 0350 01/17/21 0629      Subjective: Patient seen and examined the bedside this morning.  Hemodynamically stable but she was complaining of severe pain on the right lower chest which is a new problem.  Does not complain of any pain on  the biopsy site yesterday.  In severe distress due to pain, crying, moaning  Objective: Vitals:   01/18/21 2011 01/19/21 0000 01/19/21 0400 01/19/21 0700  BP: (!) 180/96 (!) 156/73 (!) 142/88 (!) 169/83  Pulse: 96 88 92 98  Resp: 19 17 18 18   Temp: 98.9 F (37.2 C) 98.5 F (36.9 C) 98.9 F (37.2 C) 99 F (37.2 C)  TempSrc: Oral Oral Oral Oral  SpO2: 91% 98% 100% 99%  Weight:       Height:        Intake/Output Summary (Last 24 hours) at 01/19/2021 03/21/2021 Last data filed at 01/18/2021 1431 Gross per 24 hour  Intake 1000 ml  Output 20 ml  Net 980 ml   Filed Weights   01/16/21 1621  Weight: 124.7 kg    Examination:  General exam: Very uncomfortable due to pain, obese HEENT: PERRL Respiratory system:  no wheezes or crackles  Cardiovascular system: S1 & S2 heard, RRR.  Gastrointestinal system: Abdomen  nondistended, soft and nontender. Central nervous system: Alert and oriented Extremities: No edema, no clubbing ,no cyanosis Skin: No rashes, no ulcers,no icterus     Data Reviewed: I have personally reviewed following labs and imaging studies  CBC: Recent Labs  Lab 01/16/21 1631 01/18/21 0400  WBC 11.3* 11.6*  NEUTROABS 8.7*  --   HGB 12.3 11.8*  HCT 38.1 37.2  MCV 92.3 95.4  PLT 404* 372   Basic Metabolic Panel: Recent Labs  Lab 01/16/21 1631 01/18/21 0400  NA 140 137  K 3.9 3.9  CL 104 102  CO2 26 25  GLUCOSE 114* 108*  BUN 9 10  CREATININE 0.71 0.62  CALCIUM 9.1 9.1   GFR: Estimated Creatinine Clearance: 98.8 mL/min (by C-G formula based on SCr of 0.62 mg/dL). Liver Function Tests: Recent Labs  Lab 01/16/21 1631  AST 12*  ALT 14  ALKPHOS 69  BILITOT 1.0  PROT 7.1  ALBUMIN 3.9   Recent Labs  Lab 01/16/21 1631  LIPASE 13   No results for input(s): AMMONIA in the last 168 hours. Coagulation Profile: Recent Labs  Lab 01/17/21 1210  INR 1.0   Cardiac Enzymes: No results for input(s): CKTOTAL, CKMB, CKMBINDEX, TROPONINI in the last 168 hours. BNP (last 3 results) No results for input(s): PROBNP in the last 8760 hours. HbA1C: Recent Labs    01/17/21 1210  HGBA1C 5.7*   CBG: No results for input(s): GLUCAP in the last 168 hours. Lipid Profile: No results for input(s): CHOL, HDL, LDLCALC, TRIG, CHOLHDL, LDLDIRECT in the last 72 hours. Thyroid Function Tests: Recent Labs    01/17/21 1210  TSH 7.737*  FREET4 1.04   T3FREE 2.5   Anemia Panel: No results for input(s): VITAMINB12, FOLATE, FERRITIN, TIBC, IRON, RETICCTPCT in the last 72 hours. Sepsis Labs: No results for input(s): PROCALCITON, LATICACIDVEN in the last 168 hours.  Recent Results (from the past 240 hour(s))  Resp Panel by RT-PCR (Flu A&B, Covid) Nasopharyngeal Swab     Status: None   Collection Time: 01/16/21  7:11 PM   Specimen: Nasopharyngeal Swab; Nasopharyngeal(NP) swabs in vial transport medium  Result Value Ref Range Status   SARS Coronavirus 2 by RT PCR NEGATIVE NEGATIVE Final    Comment: (NOTE) SARS-CoV-2 target nucleic acids are NOT DETECTED.  The SARS-CoV-2 RNA is generally detectable in upper respiratory specimens during the acute phase of infection. The lowest concentration of SARS-CoV-2 viral copies this assay can detect is 138 copies/mL. A negative result does not  preclude SARS-Cov-2 infection and should not be used as the sole basis for treatment or other patient management decisions. A negative result may occur with  improper specimen collection/handling, submission of specimen other than nasopharyngeal swab, presence of viral mutation(s) within the areas targeted by this assay, and inadequate number of viral copies(<138 copies/mL). A negative result must be combined with clinical observations, patient history, and epidemiological information. The expected result is Negative.  Fact Sheet for Patients:  BloggerCourse.com  Fact Sheet for Healthcare Providers:  SeriousBroker.it  This test is no t yet approved or cleared by the [place] FDA and  has been authorized for detection and/or diagnosis of SARS-CoV-2 by FDA under an Emergency Use Authorization (EUA). This EUA will remain  in effect (meaning this test can be used) for the duration of the COVID-19 declaration under Section 564(b)(1) of the Act, 21 U.S.C.section 360bbb-3(b)(1), unless the authorization  is terminated  or revoked sooner.       Influenza A by PCR NEGATIVE NEGATIVE Final   Influenza B by PCR NEGATIVE NEGATIVE Final    Comment: (NOTE) The Xpert Xpress SARS-CoV-2/FLU/RSV plus assay is intended as an aid in the diagnosis of influenza from Nasopharyngeal swab specimens and should not be used as a sole basis for treatment. Nasal washings and aspirates are unacceptable for Xpert Xpress SARS-CoV-2/FLU/RSV testing.  Fact Sheet for Patients: BloggerCourse.com  Fact Sheet for Healthcare Providers: SeriousBroker.it  This test is not yet approved or cleared by the [place] FDA and has been authorized for detection and/or diagnosis of SARS-CoV-2 by FDA under an Emergency Use Authorization (EUA). This EUA will remain in effect (meaning this test can be used) for the duration of the COVID-19 declaration under Section 564(b)(1) of the Act, 21 U.S.C. section 360bbb-3(b)(1), unless the authorization is terminated or revoked.  Performed at [facility]   Blood culture (routine x 2)     Status: None (Preliminary result)   Collection Time: 01/16/21 11:37 PM   Specimen: BLOOD  Result Value Ref Range Status   Specimen Description BLOOD RIGHT ANTECUBITAL  Final   Special Requests   Final    BOTTLES DRAWN AEROBIC AND ANAEROBIC Blood Culture results may not be optimal due to an inadequate volume of blood received in culture bottles   Culture   Final    NO GROWTH 1 DAY Performed at [facility], [address] [place], [place] [address]    Report Status PENDING  Incomplete  Blood culture (routine x 2)     Status: None (Preliminary result)   Collection Time: 01/16/21 11:40 PM   Specimen: BLOOD  Result Value Ref Range Status   Specimen Description BLOOD RIGHT ANTECUBITAL  Final   Special Requests   Final    BOTTLES DRAWN AEROBIC AND ANAEROBIC Blood Culture results may not be optimal due to an inadequate  volume of blood received in culture bottles   Culture   Final    NO GROWTH 1 DAY Performed at [facility], [address], [place], [place] [address]    Report Status PENDING  Incomplete         Radiology Studies: [place] EKG SITE RITE  Result Date: 01/17/2021 If Site Rite image not attached, placement could not be confirmed due to current cardiac rhythm.       Scheduled Meds: . docusate sodium  100 mg Oral BID  . gabapentin  300 mg Oral TID  . multivitamin with minerals  1 tablet  Oral Daily  . Ensure Max Protein  11 oz Oral BID  . sodium chloride flush  3 mL Intravenous Q12H   Continuous Infusions: . methocarbamol (ROBAXIN) IV 1,000 mg (01/18/21 1756)     LOS: 2 days    Time spent:25 mins. More than 50% of that time was spent in counseling and/or coordination of care.      Burnadette PopAmrit Kelyn Koskela, MD [facility] P4/03/2021, 8:08 AM

## 2021-01-19 NOTE — Progress Notes (Signed)
Subjective:  The patient is alert and pleasant.  She says her back is "the same".  Her PICC line is in place.  Objective: Vital signs in last 24 hours: Temp:  [97 F (36.1 C)-99 F (37.2 C)] 99 F (37.2 C) (04/06 0700) Pulse Rate:  [68-98] 98 (04/06 0700) Resp:  [15-30] 18 (04/06 0700) BP: (135-180)/(68-96) 169/83 (04/06 0700) SpO2:  [91 %-100 %] 99 % (04/06 0700) Estimated body mass index is 57.48 kg/m as calculated from the following:   Height as of this encounter: 4\' 10"  (1.473 m).   Weight as of this encounter: 124.7 kg.   Intake/Output from previous day: 04/05 0701 - 04/06 0700 In: 1000 [I.V.:1000] Out: 20 [Blood:20] Intake/Output this shift: No intake/output data recorded.  Physical exam   The patient is alert and oriented.  Her strength and sensation are normal  In her lower extremities.  Lab Results: Recent Labs    01/16/21 1631 01/18/21 0400  WBC 11.3* 11.6*  HGB 12.3 11.8*  HCT 38.1 37.2  PLT 404* 372   BMET Recent Labs    01/16/21 1631 01/18/21 0400  NA 140 137  K 3.9 3.9  CL 104 102  CO2 26 25  GLUCOSE 114* 108*  BUN 9 10  CREATININE 0.71 0.62  CALCIUM 9.1 9.1    Studies/Results: No results found.  Assessment/Plan:   Thoracic diskitis, osteomyelitis, stenosis, pain:  The patient is back on her empiric antibiotics. ID is managing her antibiotics. We will we await the results of her cultures.  She does not need surgery presently.    n LOS: 2 days     03/20/21 01/19/2021, 11:16 AM

## 2021-01-19 NOTE — Plan of Care (Signed)

## 2021-01-19 NOTE — Anesthesia Postprocedure Evaluation (Signed)
Anesthesia Post Note  Patient: Stacey Kaiser  Procedure(s) Performed: IR WITH ANESTHESIA (N/A )     Anesthesia Post Evaluation No complications documented.  Last Vitals:  Vitals:   01/19/21 1828 01/19/21 2029  BP:  129/78  Pulse:  95  Resp:  18  Temp:  36.8 C  SpO2: 92% 98%    Last Pain:  Vitals:   01/19/21 2029  TempSrc: Oral  PainSc:                  Burdell Peed

## 2021-01-20 LAB — QUANTIFERON-TB GOLD PLUS: QuantiFERON-TB Gold Plus: NEGATIVE

## 2021-01-20 LAB — CBC WITH DIFFERENTIAL/PLATELET
Abs Immature Granulocytes: 0.03 10*3/uL (ref 0.00–0.07)
Basophils Absolute: 0.1 10*3/uL (ref 0.0–0.1)
Basophils Relative: 1 %
Eosinophils Absolute: 0.3 10*3/uL (ref 0.0–0.5)
Eosinophils Relative: 2 %
HCT: 33.7 % — ABNORMAL LOW (ref 36.0–46.0)
Hemoglobin: 10.7 g/dL — ABNORMAL LOW (ref 12.0–15.0)
Immature Granulocytes: 0 %
Lymphocytes Relative: 13 %
Lymphs Abs: 1.4 10*3/uL (ref 0.7–4.0)
MCH: 29.8 pg (ref 26.0–34.0)
MCHC: 31.8 g/dL (ref 30.0–36.0)
MCV: 93.9 fL (ref 80.0–100.0)
Monocytes Absolute: 0.8 10*3/uL (ref 0.1–1.0)
Monocytes Relative: 7 %
Neutro Abs: 8.6 10*3/uL — ABNORMAL HIGH (ref 1.7–7.7)
Neutrophils Relative %: 77 %
Platelets: 350 10*3/uL (ref 150–400)
RBC: 3.59 MIL/uL — ABNORMAL LOW (ref 3.87–5.11)
RDW: 14 % (ref 11.5–15.5)
WBC: 11.1 10*3/uL — ABNORMAL HIGH (ref 4.0–10.5)
nRBC: 0 % (ref 0.0–0.2)

## 2021-01-20 LAB — BASIC METABOLIC PANEL
Anion gap: 6 (ref 5–15)
BUN: 11 mg/dL (ref 6–20)
CO2: 29 mmol/L (ref 22–32)
Calcium: 8.6 mg/dL — ABNORMAL LOW (ref 8.9–10.3)
Chloride: 101 mmol/L (ref 98–111)
Creatinine, Ser: 0.57 mg/dL (ref 0.44–1.00)
GFR, Estimated: >60 mL/min (ref >60)
Glucose, Bld: 94 mg/dL (ref 70–99)
Potassium: 3.4 mmol/L — ABNORMAL LOW (ref 3.5–5.1)
Sodium: 136 mmol/L (ref 135–145)

## 2021-01-20 LAB — QUANTIFERON-TB GOLD PLUS (RQFGPL)
QuantiFERON Mitogen Value: 8.07 IU/mL
QuantiFERON Nil Value: 0.45 IU/mL
QuantiFERON TB1 Ag Value: 0.33 IU/mL
QuantiFERON TB2 Ag Value: 0.31 IU/mL

## 2021-01-20 LAB — BRAIN NATRIURETIC PEPTIDE: B Natriuretic Peptide: 229.2 pg/mL — ABNORMAL HIGH (ref 0.0–100.0)

## 2021-01-20 MED ORDER — BISACODYL 10 MG RE SUPP
10.0000 mg | Freq: Once | RECTAL | Status: DC
  Filled 2021-01-20: qty 1

## 2021-01-20 MED ORDER — FUROSEMIDE 10 MG/ML IJ SOLN: 40.0000 mg | Freq: Two times a day (BID) | INTRAMUSCULAR | Status: DC

## 2021-01-20 MED ORDER — POLYETHYLENE GLYCOL 3350 17 G PO PACK
17.0000 g | PACK | Freq: Two times a day (BID) | ORAL | Status: DC
  Administered 2021-01-20 – 2021-01-23 (×4): 17 g via ORAL
  Filled 2021-01-20 (×7): qty 1

## 2021-01-20 MED ORDER — SENNOSIDES-DOCUSATE SODIUM 8.6-50 MG PO TABS
1.0000 | ORAL_TABLET | Freq: Two times a day (BID) | ORAL | Status: DC
  Administered 2021-01-20 – 2021-01-23 (×4): 1 via ORAL
  Filled 2021-01-20 (×6): qty 1

## 2021-01-20 MED ORDER — POTASSIUM CHLORIDE CRYS ER 20 MEQ PO TBCR
40.0000 meq | EXTENDED_RELEASE_TABLET | Freq: Once | ORAL | Status: AC
  Administered 2021-01-20: 40 meq via ORAL
  Filled 2021-01-20: qty 2

## 2021-01-20 MED ORDER — FUROSEMIDE 10 MG/ML IJ SOLN
40.0000 mg | Freq: Two times a day (BID) | INTRAMUSCULAR | Status: AC
  Administered 2021-01-20 – 2021-01-21 (×2): 40 mg via INTRAVENOUS
  Filled 2021-01-20 (×2): qty 4

## 2021-01-20 NOTE — Plan of Care (Signed)
?  Problem: Clinical Measurements: ?Goal: Ability to maintain clinical measurements within normal limits will improve ?Outcome: Progressing ?Goal: Will remain free from infection ?Outcome: Progressing ?Goal: Diagnostic test results will improve ?Outcome: Progressing ?  ?

## 2021-01-20 NOTE — Progress Notes (Signed)
Providing Compassionate, Quality Care - Together   Subjective: Patient reports "nerve pain" that radiates around her ribs.  Objective: Vital signs in last 24 hours: Temp:  [97.7 F (36.5 C)-98.8 F (37.1 C)] 98.8 F (37.1 C) (04/07 0700) Pulse Rate:  [87-97] 88 (04/07 0700) Resp:  [18-19] 18 (04/07 0700) BP: (128-146)/(65-78) 138/73 (04/07 0700) SpO2:  [92 %-98 %] 95 % (04/07 0700)  Intake/Output from previous day: 04/06 0701 - 04/07 0700 In: 200.8 [IV Piggyback:200.8] Out: 500 [Urine:500] Intake/Output this shift: No intake/output data recorded.  Alert and oriented x 4 PERRLA CN II-XII grossly intact MAE, Strength and sensation intact   Lab Results: Recent Labs    01/18/21 0400 01/20/21 0331  WBC 11.6* 11.1*  HGB 11.8* 10.7*  HCT 37.2 33.7*  PLT 372 350   BMET Recent Labs    01/18/21 0400 01/20/21 0331  NA 137 136  K 3.9 3.4*  CL 102 101  CO2 25 29  GLUCOSE 108* 94  BUN 10 11  CREATININE 0.62 0.57  CALCIUM 9.1 8.6*    Studies/Results: DG CHEST PORT 1 VIEW  Result Date: 01/19/2021 CLINICAL DATA:  Chest pain. EXAM: PORTABLE CHEST 1 VIEW COMPARISON:  Chest x-ray 01/17/2021.  CT chest 01/16/2021. FINDINGS: Cardiomegaly with pulmonary vascular prominence again noted. Mild left base atelectasis. No focal infiltrate. No pleural effusion or pneumothorax. IMPRESSION: Cardiomegaly with pulmonary vascular prominence noted. Mild left base atelectasis. No focal infiltrate. Electronically Signed   By: Maisie Fus  Register   On: 01/19/2021 13:21   ECHOCARDIOGRAM COMPLETE  Result Date: 01/19/2021    ECHOCARDIOGRAM REPORT   Patient Name:   Stacey Kaiser Date of Exam: 01/19/2021 Medical Rec #:  [id number]               Height:       58.0 in Accession #:    [id number]              Weight:       275.0 lb Date of Birth:  1971-06-20               BSA:          2.085 m Patient Age:    49 years                BP:           146/68 mmHg Patient Gender: F                        HR:           104 bpm. Exam Location:  Inpatient Procedure: 2D Echo Indications:    CHF  History:        Patient has no prior history of Echocardiogram examinations.  Sonographer:    Thurman Coyer RDCS (AE) Referring Phys: [id number] AMRIT ADHIKARI IMPRESSIONS  1. Left ventricular ejection fraction, by estimation, is 55 to 60%. The left ventricle has normal function. The left ventricle has no regional wall motion abnormalities. Left ventricular diastolic parameters were normal. The lateral e' velocity is 10 cm/s.  2. Right ventricular systolic function is normal. The right ventricular size is normal. There is moderately elevated pulmonary artery systolic pressure. The estimated right ventricular systolic pressure is 49.5 mmHg.  3. The mitral valve is abnormal. Mild mitral valve regurgitation.  4. The aortic valve is tricuspid. Aortic valve regurgitation is not visualized.  5. The inferior vena cava is normal in size with greater than 50%  respiratory variability, suggesting right atrial pressure of 3 mmHg. Comparison(s): No prior Echocardiogram. FINDINGS  Left Ventricle: Left ventricular ejection fraction, by estimation, is 55 to 60%. The left ventricle has normal function. The left ventricle has no regional wall motion abnormalities. The left ventricular internal cavity size was normal in size. There is  no left ventricular hypertrophy. Left ventricular diastolic parameters were normal. Indeterminate filling pressures. The E/e' is 10. Right Ventricle: The right ventricular size is normal. No increase in right ventricular wall thickness. Right ventricular systolic function is normal. There is moderately elevated pulmonary artery systolic pressure. The tricuspid regurgitant velocity is 3.41 m/s, and with an assumed right atrial pressure of 3 mmHg, the estimated right ventricular systolic pressure is 49.5 mmHg. Left Atrium: Left atrial size was normal in size. Right Atrium: Right atrial size was normal in size.  Pericardium: There is no evidence of pericardial effusion. Mitral Valve: The mitral valve is abnormal. There is mild thickening of the mitral valve leaflet(s). Mild mitral valve regurgitation. Tricuspid Valve: The tricuspid valve is grossly normal. Tricuspid valve regurgitation is mild. Aortic Valve: The aortic valve is tricuspid. Aortic valve regurgitation is not visualized. Pulmonic Valve: The pulmonic valve was normal in structure. Pulmonic valve regurgitation is not visualized. Aorta: The aortic root and ascending aorta are structurally normal, with no evidence of dilitation. Venous: The inferior vena cava is normal in size with greater than 50% respiratory variability, suggesting right atrial pressure of 3 mmHg. IAS/Shunts: No atrial level shunt detected by color flow Doppler.  LEFT VENTRICLE PLAX 2D LVIDd:         3.80 cm  Diastology LVIDs:         2.70 cm  LV e' medial:    8.59 cm/s LV PW:         1.00 cm  LV E/e' medial:  15.3 LV IVS:        0.90 cm  LV e' lateral:   10.40 cm/s LVOT diam:     1.70 cm  LV E/e' lateral: 12.6 LV SV:         72 LV SV Index:   35 LVOT Area:     2.27 cm  RIGHT VENTRICLE RV S prime:     17.30 cm/s TAPSE (M-mode): 2.0 cm LEFT ATRIUM             Index       RIGHT ATRIUM           Index LA diam:        3.70 cm 1.77 cm/m  RA Area:     18.10 cm LA Vol (A2C):   45.6 ml 21.87 ml/m RA Volume:   52.40 ml  25.13 ml/m LA Vol (A4C):   46.9 ml 22.49 ml/m LA Biplane Vol: 47.2 ml 22.64 ml/m  AORTIC VALVE LVOT Vmax:   162.00 cm/s LVOT Vmean:  107.000 cm/s LVOT VTI:    0.318 m  AORTA Ao Root diam: 2.80 cm MITRAL VALVE                TRICUSPID VALVE MV Area (PHT): 4.29 cm     TR Peak grad:   46.5 mmHg MV Decel Time: 177 msec     TR Vmax:        341.00 cm/s MV E velocity: 131.00 cm/s MV A velocity: 90.70 cm/s   SHUNTS MV E/A ratio:  1.44         Systemic VTI:  0.32 m  Systemic Diam: 1.70 cm [name] MD Electronically signed by [name] MD Signature  Date/Time: 01/19/2021/5:29:05 PM    Final     Assessment/Plan: Patient with thoracic discitis, osteomyelitis, stenosis, and thoracic pain. ID managing antibiotics. Patient presently on vancomycin and ceftriaxone. Plan is for 6 week course.   LOS: 3 days    -Encourage mobilization as tolerated. -Gabapentin was increased from home dose of 100 mg TID to 400 mg TID yesterday. This should start helping the nerve pain patient is experiencing in her ribs.   -No surgical intervention recommended.  Val Eagle, DNP, AGNP-C Nurse Practitioner  [facility] [address], [place], [place] [address] P: [phone]    F: [phone]  01/20/2021, 10:14 AM

## 2021-01-20 NOTE — Progress Notes (Addendum)
PROGRESS NOTE    Stacey Kaiser  YKD:[id number] DOB: 1971/02/01 DOA: 01/16/2021 PCP: Patient, No Pcp Per (Inactive)   Chief Complain: Back pain  Brief Narrative: Patient is a 50 year old female with history of morbid obesity with BMI of 57 who presents with severe back pain.  She was seen and treated with different medications for back pain as an outpatient but it did not resolve so she presented to the emergency department.  CT imagings showed discitis and compression fracture at T8.  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  ID/IR/neurosurgery consulted.  Underwent disc biopsy by IR on 01/18/21. Started on IV antibiotics.  Hospital course remarkable for severe back pain with radiation.  Awaiting PT/OT evaluation  Assessment & Plan:   Principal Problem:   Discitis Active Problems:   Morbid obesity with BMI of 50.0-59.9, adult (HCC)   Discitis/osteomyelitis: Imagings showed discitis/osteomyelitis on T8-T9 with compression fracture of T9 and also presence of epidural phlegmon.  ID/IR/neurosurgery consulted.  Underwent  T9 core biopsies  on 01/18/21 under general anesthesia .  Blood Cultures have been sent, will follow cultures.  We will also follow biopsy report Has been started on IV antibiotics now.  ID recommended to continue vancomycin and ceftriaxone for 6 weeks and follow-up as an outpatient. Currently she is afebrile, has mild  Leukocytosis. Presented with severe back pain but her pain is migrating and today she was complaining of severe pain on her right lower chest.  Continues to cry, morning. Continue pain management, supportive care. Continue aggressive pain management.  Currently on Dilaudid, oxycodone, fentanyl, Robaxin. We have requested a PT/OT evaluation. She looks more comfortable today, pain better  Abdominal distention: Distended abdomen but denies any abdominal pain.  Abdominal imaging she  did not show any intra-abdominal or intrapelvic pathology. Continue bowel regimen for constipation.  Morbid obesity: BMI 57.48.  Weight loss, healthy diet encouraged  Elevated TSH: Mild elevated TSH of 7.7.  Normal free T4 and T3.  Will not start on Synthyroid.  Uninsured: TOC consulted  Respiratory insufficiency: Currently on 2 to 3 days of nasal cannula oxygen.  No history of respiratory disease.  Chest x-ray some cardiomegaly with congestion and small bilateral pleural effusion.   Lungs are clear on auscultation.  BNP  elevated.  Echocardiogram showed normal left ventricular function with ejection fraction of 55 to 60%, normal diastolic parameters, moderately elevated pulmonary artery systolic pressure.  Continue to wean the oxygen. Her respiratory insufficiency could be contributed by undiagnosed OSA/obesity hypoventilation syndrome. We will give her 2 doses of IV Lasix.    Nutrition Problem: Increased nutrient needs Etiology: acute illness      DVT prophylaxis:Lovenox Code Status: Full Family Communication: None at the bedside Status is: Inpatient  Remains inpatient appropriate because:Inpatient level of care appropriate due to severity of illness   Dispo: The patient is from: Home              Anticipated d/c is to: Home vs SNF              Patient currently is not medically stable to d/c.   Difficult to place patient No    Consultants: IR, neurosurgery, ID  Procedures:Plan doe disc aspiration  Antimicrobials:  Anti-infectives (From admission, onward)   Start     Dose/Rate Route Frequency Ordered Stop   01/20/21 1100  Vancomycin (VANCOCIN) 1,250 mg in sodium chloride 0.9 % 250 mL IVPB  1,250 mg 166.7 mL/hr over 90 Minutes Intravenous Every 24 hours 01/19/21 1012     01/19/21 1100  vancomycin (VANCOCIN) 2,500 mg in sodium chloride 0.9 % 500 mL IVPB        2,500 mg 250 mL/hr over 120 Minutes Intravenous  Once 01/19/21 0929 01/19/21 1339   01/19/21 1015   cefTRIAXone (ROCEPHIN) 2 g in sodium chloride 0.9 % 100 mL IVPB        2 g 200 mL/hr over 30 Minutes Intravenous Every 24 hours 01/19/21 0923     01/17/21 0400  vancomycin (VANCOCIN) 2,500 mg in sodium chloride 0.9 % 500 mL IVPB        2,500 mg 250 mL/hr over 120 Minutes Intravenous  Once 01/17/21 0350 01/17/21 8101      Subjective: Patient seen and examined the bedside this morning.  Hemodynamically stable today.  She appears more comfortable today.  Pain better today.  Back pain has radiated towards the left side today.  Objective: Vitals:   01/19/21 1300 01/19/21 1828 01/19/21 2029 01/20/21 0558  BP: (!) 146/68  129/78 133/65  Pulse: 97  95 87  Resp: 18  18 18   Temp: 98.8 F (37.1 C)  98.3 F (36.8 C) 97.8 F (36.6 C)  TempSrc: Axillary  Oral Oral  SpO2: 96% 92% 98%   Weight:      Height:        Intake/Output Summary (Last 24 hours) at 01/20/2021 0806 Last data filed at 01/20/2021 0543 Gross per 24 hour  Intake 200.8 ml  Output 500 ml  Net -299.2 ml   Filed Weights   01/16/21 1621  Weight: 124.7 kg    Examination:  General exam: Overall comfortable, obese HEENT:PERRL,Oral mucosa moist, Ear/Nose normal on gross exam Respiratory system: Bilateral equal air entry, normal vesicular breath sounds, no wheezes or crackles  Cardiovascular system: S1 & S2 heard, RRR. No JVD, murmurs, rubs, gallops or clicks. Gastrointestinal system: Abdomen is nondistended, soft and nontender. No organomegaly or masses felt. Normal bowel sounds heard. Central nervous system: Alert and oriented. No focal neurological deficits. Extremities: No edema, no clubbing ,no cyanosis Skin: No rashes, lesions or ulcers,no icterus ,no pallor   Data Reviewed: I have personally reviewed following labs and imaging studies  CBC: Recent Labs  Lab 01/16/21 1631 01/18/21 0400 01/20/21 0331  WBC 11.3* 11.6* 11.1*  NEUTROABS 8.7*  --  8.6*  HGB 12.3 11.8* 10.7*  HCT 38.1 37.2 33.7*  MCV 92.3 95.4 93.9   PLT 404* 372 350   Basic Metabolic Panel: Recent Labs  Lab 01/16/21 1631 01/18/21 0400 01/20/21 0331  NA 140 137 136  K 3.9 3.9 3.4*  CL 104 102 101  CO2 26 25 29   GLUCOSE 114* 108* 94  BUN 9 10 11   CREATININE 0.71 0.62 0.57  CALCIUM 9.1 9.1 8.6*   GFR: Estimated Creatinine Clearance: 98.8 mL/min (by C-G formula based on SCr of 0.57 mg/dL). Liver Function Tests: Recent Labs  Lab 01/16/21 1631  AST 12*  ALT 14  ALKPHOS 69  BILITOT 1.0  PROT 7.1  ALBUMIN 3.9   Recent Labs  Lab 01/16/21 1631  LIPASE 13   No results for input(s): AMMONIA in the last 168 hours. Coagulation Profile: Recent Labs  Lab 01/17/21 1210  INR 1.0   Cardiac Enzymes: No results for input(s): CKTOTAL, CKMB, CKMBINDEX, TROPONINI in the last 168 hours. BNP (last 3 results) No results for input(s): PROBNP in the last 8760 hours. HbA1C: Recent Labs  01/17/21 1210  HGBA1C 5.7*   CBG: No results for input(s): GLUCAP in the last 168 hours. Lipid Profile: No results for input(s): CHOL, HDL, LDLCALC, TRIG, CHOLHDL, LDLDIRECT in the last 72 hours. Thyroid Function Tests: Recent Labs    01/17/21 1210  TSH 7.737*  FREET4 1.04  T3FREE 2.5   Anemia Panel: No results for input(s): VITAMINB12, FOLATE, FERRITIN, TIBC, IRON, RETICCTPCT in the last 72 hours. Sepsis Labs: No results for input(s): PROCALCITON, LATICACIDVEN in the last 168 hours.  Recent Results (from the past 240 hour(s))  Resp Panel by RT-PCR (Flu A&B, Covid) Nasopharyngeal Swab     Status: None   Collection Time: 01/16/21  7:11 PM   Specimen: Nasopharyngeal Swab; Nasopharyngeal(NP) swabs in vial transport medium  Result Value Ref Range Status   SARS Coronavirus 2 by RT PCR NEGATIVE NEGATIVE Final    Comment: (NOTE) SARS-CoV-2 target nucleic acids are NOT DETECTED.  The SARS-CoV-2 RNA is generally detectable in upper respiratory specimens during the acute phase of infection. The lowest concentration of SARS-CoV-2 viral  copies this assay can detect is 138 copies/mL. A negative result does not preclude SARS-Cov-2 infection and should not be used as the sole basis for treatment or other patient management decisions. A negative result may occur with  improper specimen collection/handling, submission of specimen other than nasopharyngeal swab, presence of viral mutation(s) within the areas targeted by this assay, and inadequate number of viral copies(<138 copies/mL). A negative result must be combined with clinical observations, patient history, and epidemiological information. The expected result is Negative.  Fact Sheet for Patients:  BloggerCourse.com  Fact Sheet for Healthcare Providers:  SeriousBroker.it  This test is no t yet approved or cleared by the [place] FDA and  has been authorized for detection and/or diagnosis of SARS-CoV-2 by FDA under an Emergency Use Authorization (EUA). This EUA will remain  in effect (meaning this test can be used) for the duration of the COVID-19 declaration under Section 564(b)(1) of the Act, 21 U.S.C.section 360bbb-3(b)(1), unless the authorization is terminated  or revoked sooner.       Influenza A by PCR NEGATIVE NEGATIVE Final   Influenza B by PCR NEGATIVE NEGATIVE Final    Comment: (NOTE) The Xpert Xpress SARS-CoV-2/FLU/RSV plus assay is intended as an aid in the diagnosis of influenza from Nasopharyngeal swab specimens and should not be used as a sole basis for treatment. Nasal washings and aspirates are unacceptable for Xpert Xpress SARS-CoV-2/FLU/RSV testing.  Fact Sheet for Patients: BloggerCourse.com  Fact Sheet for Healthcare Providers: SeriousBroker.it  This test is not yet approved or cleared by the [place] FDA and has been authorized for detection and/or diagnosis of SARS-CoV-2 by FDA under an Emergency Use Authorization (EUA). This  EUA will remain in effect (meaning this test can be used) for the duration of the COVID-19 declaration under Section 564(b)(1) of the Act, 21 U.S.C. section 360bbb-3(b)(1), unless the authorization is terminated or revoked.  Performed at [facility]   Blood culture (routine x 2)     Status: None (Preliminary result)   Collection Time: 01/16/21 11:37 PM   Specimen: BLOOD  Result Value Ref Range Status   Specimen Description BLOOD RIGHT ANTECUBITAL  Final   Special Requests   Final    BOTTLES DRAWN AEROBIC AND ANAEROBIC Blood Culture results may not be optimal due to an inadequate volume of blood received in culture bottles   Culture   Final    NO GROWTH 2 DAYS  Performed at [facility], [address], [place], [place] [address]    Report Status PENDING  Incomplete  Blood culture (routine x 2)     Status: None (Preliminary result)   Collection Time: 01/16/21 11:40 PM   Specimen: BLOOD  Result Value Ref Range Status   Specimen Description BLOOD RIGHT ANTECUBITAL  Final   Special Requests   Final    BOTTLES DRAWN AEROBIC AND ANAEROBIC Blood Culture results may not be optimal due to an inadequate volume of blood received in culture bottles   Culture   Final    NO GROWTH 2 DAYS Performed at [facility], [address], [place], [place] [address]    Report Status PENDING  Incomplete         Radiology Studies: DG CHEST PORT 1 VIEW  Result Date: 01/19/2021 CLINICAL DATA:  Chest pain. EXAM: PORTABLE CHEST 1 VIEW COMPARISON:  Chest x-ray 01/17/2021.  CT chest 01/16/2021. FINDINGS: Cardiomegaly with pulmonary vascular prominence again noted. Mild left base atelectasis. No focal infiltrate. No pleural effusion or pneumothorax. IMPRESSION: Cardiomegaly with pulmonary vascular prominence noted. Mild left base atelectasis. No focal infiltrate. Electronically Signed   By: Maisie Fus  Register   On: 01/19/2021 13:21   ECHOCARDIOGRAM COMPLETE  Result Date:  01/19/2021    ECHOCARDIOGRAM REPORT   Patient Name:   [facility] Date of Exam: 01/19/2021 Medical Rec #:  [id number]               Height:       58.0 in Accession #:    [id number]              Weight:       275.0 lb Date of Birth:  [birth year]               BSA:          2.085 m Patient Age:    49 years                BP:           146/68 mmHg Patient Gender: F                       HR:           104 bpm. Exam Location:  Inpatient Procedure: 2D Echo Indications:    CHF  History:        Patient has no prior history of Echocardiogram examinations.  Sonographer:    Thurman Coyer RDCS (AE) Referring Phys: [id number] Rosea Dory IMPRESSIONS  1. Left ventricular ejection fraction, by estimation, is 55 to 60%. The left ventricle has normal function. The left ventricle has no regional wall motion abnormalities. Left ventricular diastolic parameters were normal. The lateral e' velocity is 10 cm/s.  2. Right ventricular systolic function is normal. The right ventricular size is normal. There is moderately elevated pulmonary artery systolic pressure. The estimated right ventricular systolic pressure is 49.5 mmHg.  3. The mitral valve is abnormal. Mild mitral valve regurgitation.  4. The aortic valve is tricuspid. Aortic valve regurgitation is not visualized.  5. The inferior vena cava is normal in size with greater than 50% respiratory variability, suggesting right atrial pressure of 3 mmHg. Comparison(s): No prior Echocardiogram. FINDINGS  Left Ventricle: Left ventricular ejection fraction, by estimation, is 55 to 60%. The left ventricle has normal function. The left ventricle has no regional wall motion abnormalities. The left ventricular internal cavity size  was normal in size. There is  no left ventricular hypertrophy. Left ventricular diastolic parameters were normal. Indeterminate filling pressures. The E/e' is 10. Right Ventricle: The right ventricular size is normal. No increase in right ventricular wall  thickness. Right ventricular systolic function is normal. There is moderately elevated pulmonary artery systolic pressure. The tricuspid regurgitant velocity is 3.41 m/s, and with an assumed right atrial pressure of 3 mmHg, the estimated right ventricular systolic pressure is 49.5 mmHg. Left Atrium: Left atrial size was normal in size. Right Atrium: Right atrial size was normal in size. Pericardium: There is no evidence of pericardial effusion. Mitral Valve: The mitral valve is abnormal. There is mild thickening of the mitral valve leaflet(s). Mild mitral valve regurgitation. Tricuspid Valve: The tricuspid valve is grossly normal. Tricuspid valve regurgitation is mild. Aortic Valve: The aortic valve is tricuspid. Aortic valve regurgitation is not visualized. Pulmonic Valve: The pulmonic valve was normal in structure. Pulmonic valve regurgitation is not visualized. Aorta: The aortic root and ascending aorta are structurally normal, with no evidence of dilitation. Venous: The inferior vena cava is normal in size with greater than 50% respiratory variability, suggesting right atrial pressure of 3 mmHg. IAS/Shunts: No atrial level shunt detected by color flow Doppler.  LEFT VENTRICLE PLAX 2D LVIDd:         3.80 cm  Diastology LVIDs:         2.70 cm  LV e' medial:    8.59 cm/s LV PW:         1.00 cm  LV E/e' medial:  15.3 LV IVS:        0.90 cm  LV e' lateral:   10.40 cm/s LVOT diam:     1.70 cm  LV E/e' lateral: 12.6 LV SV:         72 LV SV Index:   35 LVOT Area:     2.27 cm  RIGHT VENTRICLE RV S prime:     17.30 cm/s TAPSE (M-mode): 2.0 cm LEFT ATRIUM             Index       RIGHT ATRIUM           Index LA diam:        3.70 cm 1.77 cm/m  RA Area:     18.10 cm LA Vol (A2C):   45.6 ml 21.87 ml/m RA Volume:   52.40 ml  25.13 ml/m LA Vol (A4C):   46.9 ml 22.49 ml/m LA Biplane Vol: 47.2 ml 22.64 ml/m  AORTIC VALVE LVOT Vmax:   162.00 cm/s LVOT Vmean:  107.000 cm/s LVOT VTI:    0.318 m  AORTA Ao Root diam: 2.80 cm  MITRAL VALVE                TRICUSPID VALVE MV Area (PHT): 4.29 cm     TR Peak grad:   46.5 mmHg MV Decel Time: 177 msec     TR Vmax:        341.00 cm/s MV E velocity: 131.00 cm/s MV A velocity: 90.70 cm/s   SHUNTS MV E/A ratio:  1.44         Systemic VTI:  0.32 m                             Systemic Diam: 1.70 cm [name] MD Electronically signed by [name] MD Signature Date/Time: 01/19/2021/5:29:05 PM    Final  Scheduled Meds: . Chlorhexidine Gluconate Cloth  6 each Topical Daily  . docusate sodium  100 mg Oral BID  . fentaNYL  1 patch Transdermal Q72H  . gabapentin  400 mg Oral TID  . lidocaine  1 patch Transdermal Q24H  . multivitamin with minerals  1 tablet Oral Daily  . Ensure Max Protein  11 oz Oral BID  . sodium chloride flush  10-40 mL Intracatheter Q12H  . sodium chloride flush  3 mL Intravenous Q12H   Continuous Infusions: . cefTRIAXone (ROCEPHIN)  IV 2 g (01/19/21 1025)  . methocarbamol (ROBAXIN) IV 1,000 mg (01/20/21 0543)  . vancomycin       LOS: 3 days    Time spent:25 mins. More than 50% of that time was spent in counseling and/or coordination of care.      Burnadette PopAmrit Bolivar Koranda, MD [facility] P4/04/2021, 8:06 AM

## 2021-01-20 NOTE — Evaluation (Signed)
Physical Therapy Evaluation Patient Details Name: Stacey Kaiser MRN: [id number] DOB: May 10, 1971 Today's Date: 01/20/2021   History of Present Illness  Pt adm 4/3 with upper back pain. 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 Neurosurgery and ID consulted. Pt started on IV antibiotics. Biopsies performed in radiology on 4/5. PMH - morbid obesity.  Clinical Impression  Pt presents to PT with severely limited mobility due to severe back pain. Currently increasing HOB or putting bed in reverse trendelenburg causes pain to go to a level that is intolerable. Pain control will need to improve for her to make progress with mobilization. Will continue to work toward increased mobility.     Follow Up Recommendations Home health PT;Supervision/Assistance - 24 hour (if pain decreases to a level where she can mobilize)    Equipment Recommendations  Other (comment) (To be determined)    Recommendations for Other Services       Precautions / Restrictions Precautions Precautions: Fall;Back      Mobility  Bed Mobility Overal bed mobility: Needs Assistance Bed Mobility: Rolling Rolling: Min guard         General bed mobility comments: Incr time and effort due to pain    Transfers                 General transfer comment: Pt unable due to pain  Ambulation/Gait             General Gait Details: Unable due to pain  Stairs            Wheelchair Mobility    Modified Rankin (Stroke Patients Only)       Balance                                             Pertinent Vitals/Pain Pain Assessment: 0-10 Pain Score: 8  Pain Location: Mid back and wrapping around flanks Pain Descriptors / Indicators: Burning Pain Intervention(s): Limited activity within patient's tolerance;Monitored during session;Repositioned    Home Living Family/patient expects to be discharged to:: Private residence Living Arrangements:  Spouse/significant other Available Help at Discharge: Family;Available 24 hours/day (mother in law will be there when husband is at work) Type of Home: Apartment Home Access: Stairs to enter   Secretary/administrator of Steps: 1+1 Home Layout: One level Home Equipment: None      Prior Function Level of Independence: Independent         Comments: Very limited by pain. Was working.     Hand Dominance        Extremity/Trunk Assessment   Upper Extremity Assessment Upper Extremity Assessment: Defer to OT evaluation    Lower Extremity Assessment Lower Extremity Assessment: RLE deficits/detail;LLE deficits/detail RLE Deficits / Details: Limited by back pain LLE Deficits / Details: Limited by back pain       Communication   Communication: No difficulties  Cognition Arousal/Alertness: Awake/alert Behavior During Therapy: WFL for tasks assessed/performed Overall Cognitive Status: Within Functional Limits for tasks assessed                                        General Comments      Exercises General Exercises - Lower Extremity Ankle Circles/Pumps: AROM;Both;10 reps;Supine Quad Sets: AROM;Both;5 reps;Supine Gluteal Sets: AROM;Both;5 reps;Supine  Assessment/Plan    PT Assessment Patient needs continued PT services  PT Problem List Decreased activity tolerance;Decreased mobility;Obesity;Pain       PT Treatment Interventions DME instruction;Gait training;Stair training;Functional mobility training;Therapeutic activities;Therapeutic exercise;Patient/family education    PT Goals (Current goals can be found in the Care Plan section)  Acute Rehab PT Goals Patient Stated Goal: Decr pain so she can move arounds PT Goal Formulation: With patient Time For Goal Achievement: 02/03/21 Potential to Achieve Goals: Fair    Frequency Min 3X/week   Barriers to discharge        Co-evaluation               AM-PAC PT "6 Clicks" Mobility  Outcome  Measure Help needed turning from your back to your side while in a flat bed without using bedrails?: A Little Help needed moving from lying on your back to sitting on the side of a flat bed without using bedrails?: Total Help needed moving to and from a bed to a chair (including a wheelchair)?: Total Help needed standing up from a chair using your arms (e.g., wheelchair or bedside chair)?: Total Help needed to walk in hospital room?: Total Help needed climbing 3-5 steps with a railing? : Total 6 Click Score: 8    End of Session   Activity Tolerance: Patient limited by pain Patient left: in bed;with call bell/phone within reach;with bed alarm set Nurse Communication: Other (comment) (emptied urine canister and documented) PT Visit Diagnosis: Pain;Other abnormalities of gait and mobility (R26.89) Pain - part of body:  (thoracic spine and radiating around ribs)    Time: 5956-3875 PT Time Calculation (min) (ACUTE ONLY): 22 min   Charges:   PT Evaluation $PT Eval Moderate Complexity: 1 Mod          [facility] PT Acute Rehabilitation Services Pager [phone] Office [phone]   Angelina Ok [facility] 01/20/2021, 3:30 PM

## 2021-01-20 NOTE — Progress Notes (Signed)
OT Cancellation Note  Patient Details Name: Stacey Kaiser MRN: [id number] DOB: Feb 04, 1971   Cancelled Treatment:    Reason Eval/Treat Not Completed: Pain limiting ability to participate. OT will follow up next available time  Galen Manila 01/20/2021, 3:26 PM

## 2021-01-20 NOTE — Plan of Care (Signed)
  Problem: Clinical Measurements: Goal: Ability to maintain clinical measurements within normal limits will improve Outcome: Progressing Goal: Will remain free from infection Outcome: Progressing   

## 2021-01-21 LAB — BASIC METABOLIC PANEL
Anion gap: 9 (ref 5–15)
BUN: 7 mg/dL (ref 6–20)
CO2: 30 mmol/L (ref 22–32)
Calcium: 8.8 mg/dL — ABNORMAL LOW (ref 8.9–10.3)
Chloride: 98 mmol/L (ref 98–111)
Creatinine, Ser: 0.65 mg/dL (ref 0.44–1.00)
GFR, Estimated: >60 mL/min (ref >60)
Glucose, Bld: 102 mg/dL — ABNORMAL HIGH (ref 70–99)
Potassium: 3.3 mmol/L — ABNORMAL LOW (ref 3.5–5.1)
Sodium: 137 mmol/L (ref 135–145)

## 2021-01-21 MED ORDER — FUROSEMIDE 10 MG/ML IJ SOLN
40.0000 mg | Freq: Two times a day (BID) | INTRAMUSCULAR | Status: AC
  Administered 2021-01-21 (×2): 40 mg via INTRAVENOUS
  Filled 2021-01-21 (×2): qty 4

## 2021-01-21 MED ORDER — ENOXAPARIN SODIUM 60 MG/0.6ML ~~LOC~~ SOLN
60.0000 mg | SUBCUTANEOUS | Status: DC
  Administered 2021-01-21 – 2021-01-24 (×4): 60 mg via SUBCUTANEOUS
  Filled 2021-01-21 (×4): qty 0.6

## 2021-01-21 MED ORDER — ACETAMINOPHEN 500 MG PO TABS
1000.0000 mg | ORAL_TABLET | Freq: Three times a day (TID) | ORAL | Status: DC
  Administered 2021-01-21 – 2021-01-24 (×9): 1000 mg via ORAL
  Filled 2021-01-21 (×10): qty 2

## 2021-01-21 MED ORDER — GABAPENTIN 300 MG PO CAPS
600.0000 mg | ORAL_CAPSULE | Freq: Three times a day (TID) | ORAL | Status: DC
  Administered 2021-01-21 – 2021-01-24 (×10): 600 mg via ORAL
  Filled 2021-01-21 (×10): qty 2

## 2021-01-21 MED ORDER — HYDROMORPHONE HCL 1 MG/ML IJ SOLN
0.5000 mg | INTRAMUSCULAR | Status: DC | PRN
  Administered 2021-01-21 – 2021-01-23 (×4): 0.5 mg via INTRAVENOUS
  Filled 2021-01-21 (×4): qty 0.5

## 2021-01-21 MED ORDER — POTASSIUM CHLORIDE CRYS ER 20 MEQ PO TBCR
40.0000 meq | EXTENDED_RELEASE_TABLET | Freq: Two times a day (BID) | ORAL | Status: AC
  Administered 2021-01-21 (×2): 40 meq via ORAL
  Filled 2021-01-21 (×2): qty 2

## 2021-01-21 MED FILL — Fentanyl Citrate Preservative Free (PF) Inj 100 MCG/2ML: INTRAMUSCULAR | Qty: 2 | Status: AC

## 2021-01-21 NOTE — TOC Initial Note (Addendum)
Transition of Care [facility]) - Initial/Assessment Note    Patient Details  Name: Stacey Kaiser MRN: [id number] Date of Birth: Apr 21, 1971  Transition of Care [facility]) CM/SW Contact:    Epifanio Lesches, RN Phone Number: 01/17/2021, 5:01 PM  Clinical Narrative:   Admitted with diskitis/osteomyeltis and compression fracture of T8. Pt will need prolonged course of antibiotics of at least 6 weeks. From home with husband. Pt with PCP, no insurance.      - s/p image-guided T8-9 disc aspiration  PICC LINE placement pending blood culture results.  Referral made with [facility] for IV ABX THERAPY.   TOC team monitoring and will assist with TOC needs.....    4/6 1630 Referral made with [facility]  ( charity care) for home health RN... pt will need 6 weeks LT IV abx therapy. Approval  pending  4/7 9:57 am NCM received call from [facility]. Pearson Grippe informed NCM they are unable to provide charity home health services 2/2 reaching charity quota for the week.  01/21/2021 1345  NCM spoke with TOC supervision regarding charity / [facility] unable to provide services 2/2 reaching quota for the week and requested a LOG for home health RN services. Approval received for LOG RN services.   Referral made with [facility] / Boneta Lucks [phone]) for [facility] services and accepted, agency only provides RN services.  Home health orders ( faxed by NCM), and LOG completed  ( information given to Tina/ Engineer, structural) for faxing [phone] . TOC team will continue to monitor for TOC needs....  Expected Discharge Plan: Home w Home Health Services Barriers to Discharge: Continued Medical Work up   Patient Goals and CMS Choice        Expected Discharge Plan and Services Expected Discharge Plan: Home w Home Health Services                                              Prior Living Arrangements/Services                       Activities of Daily Living Home  Assistive Devices/Equipment: Eyeglasses ADL Screening (condition at time of admission) Patient's cognitive ability adequate to safely complete daily activities?: Yes Is the patient deaf or have difficulty hearing?: No Does the patient have difficulty seeing, even when wearing glasses/contacts?: No Does the patient have difficulty concentrating, remembering, or making decisions?: No Patient able to express need for assistance with ADLs?: Yes Does the patient have difficulty dressing or bathing?: No Independently performs ADLs?: Yes (appropriate for developmental age) Does the patient have difficulty walking or climbing stairs?: Yes Weakness of Legs: Both Weakness of Arms/Hands: None  Permission Sought/Granted                  Emotional Assessment              Admission diagnosis:  Discitis of thoracic region [M46.44] Discitis [M46.40] Compression fracture of body of thoracic vertebra (HCC) [S22.000A] Patient Active Problem List   Diagnosis Date Noted  . Discitis 01/17/2021  . Morbid obesity with BMI of 50.0-59.9, adult (HCC) 01/17/2021   PCP:  Patient, No Pcp Per (Inactive) Pharmacy:   [facility] [address] [place] - [address] [place] [address] Phone: [phone] Fax: [phone]     Social Determinants  of Health (SDOH) Interventions    Readmission Risk Interventions No flowsheet data found.  

## 2021-01-21 NOTE — Progress Notes (Signed)
Physical Therapy Treatment Patient Details Name: Stacey Kaiser MRN: [id number] DOB: April 23, 1971 Today's Date: 01/21/2021    History of Present Illness Pt adm 4/3 with upper back pain. 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 Neurosurgery and ID consulted. Pt started on IV antibiotics. Biopsies performed in radiology on 4/5. PMH - morbid obesity.    PT Comments    Pt was assisted OOB to sit, noted her efforts to sit fully upright.  Her postural control is a struggle, but with help can stand, balance with walker and can laterally scoot up the bed.  Follow through is encouraging and will anticipate her return to home potentially requiring a rehab stay.  Pt feels confident she can get more mobile but will recommend regrouping next session to see if she can walk more and manage with a walker vs a wheelchair.  Her significant other is not there all the time, works and will need to see if other help can be obtained.  See for goals of acute PT.                     Follow Up Recommendations  Home health PT;Supervision/Assistance - 24 hour     Equipment Recommendations  Other (comment)    Recommendations for Other Services       Precautions / Restrictions Precautions Precautions: Fall;Back Precaution Comments: reviewed spinal precautions Restrictions Weight Bearing Restrictions: No Other Position/Activity Restrictions: instructed bed mobility with log roll    Mobility  Bed Mobility Overal bed mobility: Needs Assistance Bed Mobility: Rolling;Sidelying to Sit;Sit to Sidelying Rolling: Min assist Sidelying to sit: Mod assist     Sit to sidelying: Mod assist General bed mobility comments: used RUE to support LE's and scoot out    Transfers Overall transfer level: Needs assistance Equipment used: Rolling walker (2 wheeled);1 person hand held assist Transfers: Sit to/from Stand;Lateral/Scoot Transfers Sit to Stand: Mod assist         Lateral/Scoot Transfers: Mod assist General transfer comment: mod assist to manage her tolerance for  Ambulation/Gait             General Gait Details: unable to take a step   Stairs             Wheelchair Mobility    Modified Rankin (Stroke Patients Only)       Balance                                            Cognition Arousal/Alertness: Awake/alert Behavior During Therapy: WFL for tasks assessed/performed Overall Cognitive Status: Within Functional Limits for tasks assessed                                        Exercises      General Comments General comments (skin integrity, edema, etc.): pt was assisted with bed pad to get back onto bed with mod assist, cues for sequence and rolling/correct body mechanics      Pertinent Vitals/Pain Pain Assessment: 0-10 Pain Score: 7  Pain Location: spine Pain Descriptors / Indicators: Burning;Aching Pain Intervention(s): Limited activity within patient's tolerance;Monitored during session;Premedicated before session;Repositioned    Home Living  Prior Function            PT Goals (current goals can now be found in the care plan section) Acute Rehab PT Goals Patient Stated Goal: Decr pain so she can move around Progress towards PT goals: Progressing toward goals    Frequency    7X/week      PT Plan Current plan remains appropriate    Co-evaluation              AM-PAC PT "6 Clicks" Mobility   Outcome Measure  Help needed turning from your back to your side while in a flat bed without using bedrails?: A Little Help needed moving from lying on your back to sitting on the side of a flat bed without using bedrails?: A Lot Help needed moving to and from a bed to a chair (including a wheelchair)?: A Lot Help needed standing up from a chair using your arms (e.g., wheelchair or bedside chair)?: A Lot Help needed to walk in  hospital room?: Total Help needed climbing 3-5 steps with a railing? : Total 6 Click Score: 11    End of Session Equipment Utilized During Treatment: Gait belt;Oxygen Activity Tolerance: Patient limited by fatigue;Patient limited by pain Patient left: in bed;with call bell/phone within reach;with bed alarm set Nurse Communication: Other (comment) (purwick in place) PT Visit Diagnosis: Pain;Other abnormalities of gait and mobility (R26.89) Pain - Right/Left:  (back) Pain - part of body:  (spine)     Time: 0630-1601 PT Time Calculation (min) (ACUTE ONLY): 45 min  Charges:  $Therapeutic Activity: 23-37 mins $Neuromuscular Re-education: 8-22 mins                Ivar Drape 01/21/2021, 9:57 PM Samul Dada, PT MS Acute Rehab Dept. Number: [facility] [phone] and [facility] [phone]

## 2021-01-21 NOTE — Evaluation (Signed)
Occupational Therapy Evaluation Patient Details Name: Stacey Kaiser MRN: [id number] DOB: [birth year] Today's Date: 01/21/2021    History of Present Illness Pt adm 4/3 with upper back pain. 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 Neurosurgery and ID consulted. Pt started on IV antibiotics. Biopsies performed in radiology on 4/5. PMH - morbid obesity.   Clinical Impression   Pt presents with decline in function and safety with ADLs and ADL mobility with impaired balance and endurance; pt very limited by pain. PTA pt lived at home with her husband and was Ind with ADLs/selfcare, mobility, was driving and working. Pt currently requires mod A to sit EOB, unable to stand for SPTs, max - total A for LB selfcare and total A for toileting. Initiated ADL A/E education (pt is familiar with). Pt would benefit form acute OT services to address impairments to maximize level of function and safety    Follow Up Recommendations  Home health OT;Supervision/Assistance - 24 hour ((if pain decreases to a level where she can mobilize to a [facility]))    Equipment Recommendations  Other (comment) (TBD pending progress)    Recommendations for Other Services       Precautions / Restrictions Precautions Precautions: Fall;Back Restrictions Weight Bearing Restrictions: No      Mobility Bed Mobility   Bed Mobility: Rolling;Sidelying to Sit;Sit to Sidelying Rolling: Min guard Sidelying to sit: Mod assist     Sit to sidelying: Mod assist General bed mobility comments: increased time and effort due to pain, mod A with trunk elevation and LE mgt    Transfers                 General transfer comment: Pt unable due to pain    Balance Overall balance assessment: Mild deficits observed, not formally tested                                         ADL either performed or assessed with clinical judgement   ADL Overall ADL's : Needs  assistance/impaired Eating/Feeding: Independent;Sitting   Grooming: Wash/dry hands;Wash/dry face;Supervision/safety;Set up;Sitting   Upper Body Bathing: Min guard;Sitting   Lower Body Bathing: Maximal assistance   Upper Body Dressing : Min guard;Sitting   Lower Body Dressing: Total assistance     Toilet Transfer Details (indicate cue type and reason): unable due to pain Toileting- Clothing Manipulation and Hygiene: Total assistance;Bed level         General ADL Comments: pt able to sit OEB x 8 minutes but unale to attempt standing due to increased pain     Vision Patient Visual Report: No change from baseline       Perception     Praxis      Pertinent Vitals/Pain Pain Assessment: 0-10 Pain Score: 6  Pain Location: back (6 before mobility, 8 during mobility, 7 after retruned to supine) Pain Descriptors / Indicators: Burning;Aching Pain Intervention(s): Limited activity within patient's tolerance;Monitored during session;Repositioned;Premedicated before session     Hand Dominance Left   Extremity/Trunk Assessment Upper Extremity Assessment Upper Extremity Assessment: Overall WFL for tasks assessed   Lower Extremity Assessment Lower Extremity Assessment: Defer to PT evaluation       Communication Communication Communication: No difficulties   Cognition Arousal/Alertness: Awake/alert Behavior During Therapy: WFL for tasks assessed/performed Overall Cognitive Status: Within Functional Limits for tasks assessed  General Comments       Exercises     Shoulder Instructions      Home Living Family/patient expects to be discharged to:: Private residence Living Arrangements: Spouse/significant other Available Help at Discharge: Family;Available 24 hours/day Type of Home: Apartment Home Access: Stairs to enter Entrance Stairs-Number of Steps: 1+1   Home Layout: One level     Bathroom Shower/Tub:  Chief Strategy Officer: Standard     Home Equipment: None          Prior Functioning/Environment Level of Independence: Independent        Comments: Very limited by pain. Was working.        OT Problem List: Impaired balance (sitting and/or standing);Pain;Decreased activity tolerance;Decreased knowledge of use of DME or AE      OT Treatment/Interventions: Self-care/ADL training;Therapeutic exercise;Patient/family education;Balance training;Therapeutic activities;DME and/or AE instruction    OT Goals(Current goals can be found in the care plan section) Acute Rehab OT Goals Patient Stated Goal: Decr pain so she can move around OT Goal Formulation: With patient/family Time For Goal Achievement: 02/04/21 Potential to Achieve Goals: Good ADL Goals Pt Will Perform Grooming: with min guard assist;with supervision;with set-up;standing Pt Will Perform Upper Body Bathing: with supervision;with set-up;with modified independence;sitting;with caregiver independent in assisting Pt Will Perform Lower Body Bathing: with mod assist;with min assist;with adaptive equipment;with caregiver independent in assisting Pt Will Perform Upper Body Dressing: with supervision;with set-up;with modified independence;sitting;with caregiver independent in assisting Pt Will Perform Lower Body Dressing: with max assist;with mod assist;with adaptive equipment;with caregiver independent in assisting Pt Will Transfer to Toilet: with max assist;with mod assist;stand pivot transfer;bedside commode Pt Will Perform Toileting - Clothing Manipulation and hygiene: with max assist;with mod assist;sitting/lateral leans;sit to/from stand;with caregiver independent in assisting  OT Frequency: Min 2X/week   Barriers to D/C:            Co-evaluation              AM-PAC OT "6 Clicks" Daily Activity     Outcome Measure Help from another person eating meals?: None Help from another person taking care of  personal grooming?: A Little Help from another person toileting, which includes using toliet, bedpan, or urinal?: Total Help from another person bathing (including washing, rinsing, drying)?: A Lot Help from another person to put on and taking off regular upper body clothing?: A Little Help from another person to put on and taking off regular lower body clothing?: Total 6 Click Score: 14   End of Session    Activity Tolerance: Patient limited by pain Patient left: in bed;with call bell/phone within reach  OT Visit Diagnosis: Other abnormalities of gait and mobility (R26.89);Pain Pain - part of body:  (back, L side)                Time: 1011-1100 OT Time Calculation (min): 49 min Charges:  OT General Charges $OT Visit: 1 Visit OT Evaluation $OT Eval Moderate Complexity: 1 Mod OT Treatments $Self Care/Home Management : 8-22 mins $Therapeutic Activity: 8-22 mins    Galen Manila 01/21/2021, 1:47 PM

## 2021-01-21 NOTE — Progress Notes (Signed)
PROGRESS NOTE    Stacey Kaiser  UJW:[id number] DOB: 05/20/1971 DOA: 01/16/2021 PCP: Patient, No Pcp Per (Inactive)   Chief Complain: Back pain  Brief Narrative: Patient is a 50 year old female with history of morbid obesity with BMI of 57 who presents with severe back pain.  She was seen and treated with different medications for back pain as an outpatient but it did not resolve so she presented to the emergency department.  CT imagings showed discitis and compression fracture at T8.  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  ID/IR/neurosurgery consulted.  Underwent disc biopsy by IR on 01/18/21. Started on IV antibiotics.  Hospital course remarkable for severe back pain with radiation. PT/OT evaluation recommending HH  Assessment & Plan:   Principal Problem:   Discitis Active Problems:   Morbid obesity with BMI of 50.0-59.9, adult (HCC)   Discitis/osteomyelitis: Imagings showed discitis/osteomyelitis on T8-T9 with compression fracture of T9 and also presence of epidural phlegmon.  ID/IR/neurosurgery consulted.  Underwent  T9 core biopsies  on 01/18/21 under general anesthesia .  Blood Cultures have been sent, will follow cultures,NGTD.  Surgical pathology did not show evidence of acute osteomyelitis. Has been started on IV antibiotics now.  ID recommended to continue vancomycin and ceftriaxone for 6 weeks and follow-up as an outpatient. Currently she is afebrile, has mild  Leukocytosis. Presented with severe back pain  On aggressive pain management.  Currently on Dilaudid, oxycodone, fentanyl, Robaxin.  We will continue to taper the pain medications, preparing for discharge. PT recommended home health,  Abdominal distention: Distended abdomen but denies any abdominal pain.  Abdominal imaging she did not show any intra-abdominal or intrapelvic pathology. Continue bowel regimen for constipation.  Morbid  obesity: BMI 57.48.  Weight loss, healthy diet encouraged  Elevated TSH: Mild elevated TSH of 7.7.  Normal free T4 and T3.  Will not start on Synthyroid.  Uninsured: TOC consulted  Hypokalemia: Potassium supplemented and being monitored  Respiratory insufficiency: Currently on 1-2 L of nasal cannula oxygen.  No history of respiratory disease.  Chest x-ray some cardiomegaly with congestion and small bilateral pleural effusion.   Lungs are clear on auscultation.  BNP  elevated.  Echocardiogram showed normal left ventricular function with ejection fraction of 55 to 60%, normal diastolic parameters, moderately elevated pulmonary artery systolic pressure.  Continue to wean the oxygen. Her respiratory insufficiency could be contributed by undiagnosed OSA/obesity hypoventilation syndrome.  She needs to have a sleep study as an outpatient. Being given IV Lasix.  She might qualify for oxygen on discharge  Morbid obesity: BMI 57.4    Nutrition Problem: Increased nutrient needs Etiology: acute illness      DVT prophylaxis:Lovenox Code Status: Full Family Communication: None at the bedside Status is: Inpatient  Remains inpatient appropriate because:Inpatient level of care appropriate due to severity of illness   Dispo: The patient is from: Home              Anticipated d/c is to: Home              Patient currently is not medically stable to d/c.   Difficult to place patient No    Consultants: IR, neurosurgery, ID  Procedures:Plan doe disc aspiration  Antimicrobials:  Anti-infectives (From admission, onward)   Start     Dose/Rate Route Frequency Ordered Stop   01/20/21 1100  Vancomycin (VANCOCIN) 1,250 mg in sodium chloride 0.9 % 250 mL IVPB  1,250 mg 166.7 mL/hr over 90 Minutes Intravenous Every 24 hours 01/19/21 1012     01/19/21 1100  vancomycin (VANCOCIN) 2,500 mg in sodium chloride 0.9 % 500 mL IVPB        2,500 mg 250 mL/hr over 120 Minutes Intravenous  Once 01/19/21  0929 01/19/21 1339   01/19/21 1015  cefTRIAXone (ROCEPHIN) 2 g in sodium chloride 0.9 % 100 mL IVPB        2 g 200 mL/hr over 30 Minutes Intravenous Every 24 hours 01/19/21 0923     01/17/21 0400  vancomycin (VANCOCIN) 2,500 mg in sodium chloride 0.9 % 500 mL IVPB        2,500 mg 250 mL/hr over 120 Minutes Intravenous  Once 01/17/21 0350 01/17/21 0973      Subjective: Patient seen and examined the bedside this morning.  Appears very comfortable today.  Pain has significantly improved today.  She still requiring 1 to 2 L of oxygen per minute.  She states she feels much better, her congestion has relieved.  I encouraged her for ambulation and get out of the bed  Objective: Vitals:   01/20/21 0558 01/20/21 0700 01/20/21 1300 01/20/21 2121  BP: 133/65 138/73 (!) 160/80 120/67  Pulse: 87 88 79 83  Resp: 18 18 18 18   Temp: 97.8 F (36.6 C) 98.8 F (37.1 C) 98.1 F (36.7 C) 97.6 F (36.4 C)  TempSrc: Oral Oral Oral Oral  SpO2:  95% 96% 93%  Weight:      Height:        Intake/Output Summary (Last 24 hours) at 01/21/2021 0756 Last data filed at 01/21/2021 0300 Gross per 24 hour  Intake 790 ml  Output 2200 ml  Net -1410 ml   Filed Weights   01/16/21 1621  Weight: 124.7 kg    Examination:  General exam: Overall comfortable, not in distress, morbidly obese HEENT: PERRL Respiratory system:  no wheezes or crackles  Cardiovascular system: S1 & S2 heard, RRR.  Gastrointestinal system: Abdomen is nondistended, soft and nontender. Central nervous system: Alert and oriented Extremities: No edema, no clubbing ,no cyanosis Skin: No rashes, no ulcers,no icterus    Data Reviewed: I have personally reviewed following labs and imaging studies  CBC: Recent Labs  Lab 01/16/21 1631 01/18/21 0400 01/20/21 0331  WBC 11.3* 11.6* 11.1*  NEUTROABS 8.7*  --  8.6*  HGB 12.3 11.8* 10.7*  HCT 38.1 37.2 33.7*  MCV 92.3 95.4 93.9  PLT 404* 372 350   Basic Metabolic Panel: Recent Labs  Lab  01/16/21 1631 01/18/21 0400 01/20/21 0331  NA 140 137 136  K 3.9 3.9 3.4*  CL 104 102 101  CO2 26 25 29   GLUCOSE 114* 108* 94  BUN 9 10 11   CREATININE 0.71 0.62 0.57  CALCIUM 9.1 9.1 8.6*   GFR: Estimated Creatinine Clearance: 98.8 mL/min (by C-G formula based on SCr of 0.57 mg/dL). Liver Function Tests: Recent Labs  Lab 01/16/21 1631  AST 12*  ALT 14  ALKPHOS 69  BILITOT 1.0  PROT 7.1  ALBUMIN 3.9   Recent Labs  Lab 01/16/21 1631  LIPASE 13   No results for input(s): AMMONIA in the last 168 hours. Coagulation Profile: Recent Labs  Lab 01/17/21 1210  INR 1.0   Cardiac Enzymes: No results for input(s): CKTOTAL, CKMB, CKMBINDEX, TROPONINI in the last 168 hours. BNP (last 3 results) No results for input(s): PROBNP in the last 8760 hours. HbA1C: No results for input(s): HGBA1C in the  last 72 hours. CBG: No results for input(s): GLUCAP in the last 168 hours. Lipid Profile: No results for input(s): CHOL, HDL, LDLCALC, TRIG, CHOLHDL, LDLDIRECT in the last 72 hours. Thyroid Function Tests: No results for input(s): TSH, T4TOTAL, FREET4, T3FREE, THYROIDAB in the last 72 hours. Anemia Panel: No results for input(s): VITAMINB12, FOLATE, FERRITIN, TIBC, IRON, RETICCTPCT in the last 72 hours. Sepsis Labs: No results for input(s): PROCALCITON, LATICACIDVEN in the last 168 hours.  Recent Results (from the past 240 hour(s))  Resp Panel by RT-PCR (Flu A&B, Covid) Nasopharyngeal Swab     Status: None   Collection Time: 01/16/21  7:11 PM   Specimen: Nasopharyngeal Swab; Nasopharyngeal(NP) swabs in vial transport medium  Result Value Ref Range Status   SARS Coronavirus 2 by RT PCR NEGATIVE NEGATIVE Final    Comment: (NOTE) SARS-CoV-2 target nucleic acids are NOT DETECTED.  The SARS-CoV-2 RNA is generally detectable in upper respiratory specimens during the acute phase of infection. The lowest concentration of SARS-CoV-2 viral copies this assay can detect is 138  copies/mL. A negative result does not preclude SARS-Cov-2 infection and should not be used as the sole basis for treatment or other patient management decisions. A negative result may occur with  improper specimen collection/handling, submission of specimen other than nasopharyngeal swab, presence of viral mutation(s) within the areas targeted by this assay, and inadequate number of viral copies(<138 copies/mL). A negative result must be combined with clinical observations, patient history, and epidemiological information. The expected result is Negative.  Fact Sheet for Patients:  BloggerCourse.com  Fact Sheet for Healthcare Providers:  SeriousBroker.it  This test is no t yet approved or cleared by the [place] FDA and  has been authorized for detection and/or diagnosis of SARS-CoV-2 by FDA under an Emergency Use Authorization (EUA). This EUA will remain  in effect (meaning this test can be used) for the duration of the COVID-19 declaration under Section 564(b)(1) of the Act, 21 U.S.C.section 360bbb-3(b)(1), unless the authorization is terminated  or revoked sooner.       Influenza A by PCR NEGATIVE NEGATIVE Final   Influenza B by PCR NEGATIVE NEGATIVE Final    Comment: (NOTE) The Xpert Xpress SARS-CoV-2/FLU/RSV plus assay is intended as an aid in the diagnosis of influenza from Nasopharyngeal swab specimens and should not be used as a sole basis for treatment. Nasal washings and aspirates are unacceptable for Xpert Xpress SARS-CoV-2/FLU/RSV testing.  Fact Sheet for Patients: BloggerCourse.com  Fact Sheet for Healthcare Providers: SeriousBroker.it  This test is not yet approved or cleared by the [place] FDA and has been authorized for detection and/or diagnosis of SARS-CoV-2 by FDA under an Emergency Use Authorization (EUA). This EUA will remain in effect (meaning  this test can be used) for the duration of the COVID-19 declaration under Section 564(b)(1) of the Act, 21 U.S.C. section 360bbb-3(b)(1), unless the authorization is terminated or revoked.  Performed at [facility]   Blood culture (routine x 2)     Status: None (Preliminary result)   Collection Time: 01/16/21 11:37 PM   Specimen: BLOOD  Result Value Ref Range Status   Specimen Description BLOOD RIGHT ANTECUBITAL  Final   Special Requests   Final    BOTTLES DRAWN AEROBIC AND ANAEROBIC Blood Culture results may not be optimal due to an inadequate volume of blood received in culture bottles   Culture   Final    NO GROWTH 3 DAYS Performed at [facility], [address], [place], [place] [address]    Report Status PENDING  Incomplete  Blood culture (routine x 2)     Status: None (Preliminary result)   Collection Time: 01/16/21 11:40 PM   Specimen: BLOOD  Result Value Ref Range Status   Specimen Description BLOOD RIGHT ANTECUBITAL  Final   Special Requests   Final    BOTTLES DRAWN AEROBIC AND ANAEROBIC Blood Culture results may not be optimal due to an inadequate volume of blood received in culture bottles   Culture   Final    NO GROWTH 3 DAYS Performed at [facility], [address], [place], [place] [address]    Report Status PENDING  Incomplete         Radiology Studies: DG CHEST PORT 1 VIEW  Result Date: 01/19/2021 CLINICAL DATA:  Chest pain. EXAM: PORTABLE CHEST 1 VIEW COMPARISON:  Chest x-ray 01/17/2021.  CT chest 01/16/2021. FINDINGS: Cardiomegaly with pulmonary vascular prominence again noted. Mild left base atelectasis. No focal infiltrate. No pleural effusion or pneumothorax. IMPRESSION: Cardiomegaly with pulmonary vascular prominence noted. Mild left base atelectasis. No focal infiltrate. Electronically Signed   By: Maisie Fus  Register   On: 01/19/2021 13:21   ECHOCARDIOGRAM COMPLETE  Result Date: 01/19/2021    ECHOCARDIOGRAM REPORT    Patient Name:   [facility] Kaiser Date of Exam: 01/19/2021 Medical Rec #:  [id number]               Height:       58.0 in Accession #:    [id number]              Weight:       275.0 lb Date of Birth:  28-Oct-1970               BSA:          2.085 m Patient Age:    49 years                BP:           146/68 mmHg Patient Gender: F                       HR:           104 bpm. Exam Location:  Inpatient Procedure: 2D Echo Indications:    CHF  History:        Patient has no prior history of Echocardiogram examinations.  Sonographer:    Thurman Coyer RDCS (AE) Referring Phys: [id number] Delano Scardino IMPRESSIONS  1. Left ventricular ejection fraction, by estimation, is 55 to 60%. The left ventricle has normal function. The left ventricle has no regional wall motion abnormalities. Left ventricular diastolic parameters were normal. The lateral e' velocity is 10 cm/s.  2. Right ventricular systolic function is normal. The right ventricular size is normal. There is moderately elevated pulmonary artery systolic pressure. The estimated right ventricular systolic pressure is 49.5 mmHg.  3. The mitral valve is abnormal. Mild mitral valve regurgitation.  4. The aortic valve is tricuspid. Aortic valve regurgitation is not visualized.  5. The inferior vena cava is normal in size with greater than 50% respiratory variability, suggesting right atrial pressure of 3 mmHg. Comparison(s): No prior Echocardiogram. FINDINGS  Left Ventricle: Left ventricular ejection fraction, by estimation, is 55 to 60%. The left ventricle has normal function. The left ventricle has no regional wall motion abnormalities. The left ventricular internal cavity size was normal in size. There is  no  left ventricular hypertrophy. Left ventricular diastolic parameters were normal. Indeterminate filling pressures. The E/e' is 10. Right Ventricle: The right ventricular size is normal. No increase in right ventricular wall thickness. Right ventricular systolic  function is normal. There is moderately elevated pulmonary artery systolic pressure. The tricuspid regurgitant velocity is 3.41 m/s, and with an assumed right atrial pressure of 3 mmHg, the estimated right ventricular systolic pressure is 49.5 mmHg. Left Atrium: Left atrial size was normal in size. Right Atrium: Right atrial size was normal in size. Pericardium: There is no evidence of pericardial effusion. Mitral Valve: The mitral valve is abnormal. There is mild thickening of the mitral valve leaflet(s). Mild mitral valve regurgitation. Tricuspid Valve: The tricuspid valve is grossly normal. Tricuspid valve regurgitation is mild. Aortic Valve: The aortic valve is tricuspid. Aortic valve regurgitation is not visualized. Pulmonic Valve: The pulmonic valve was normal in structure. Pulmonic valve regurgitation is not visualized. Aorta: The aortic root and ascending aorta are structurally normal, with no evidence of dilitation. Venous: The inferior vena cava is normal in size with greater than 50% respiratory variability, suggesting right atrial pressure of 3 mmHg. IAS/Shunts: No atrial level shunt detected by color flow Doppler.  LEFT VENTRICLE PLAX 2D LVIDd:         3.80 cm  Diastology LVIDs:         2.70 cm  LV e' medial:    8.59 cm/s LV PW:         1.00 cm  LV E/e' medial:  15.3 LV IVS:        0.90 cm  LV e' lateral:   10.40 cm/s LVOT diam:     1.70 cm  LV E/e' lateral: 12.6 LV SV:         72 LV SV Index:   35 LVOT Area:     2.27 cm  RIGHT VENTRICLE RV S prime:     17.30 cm/s TAPSE (M-mode): 2.0 cm LEFT ATRIUM             Index       RIGHT ATRIUM           Index LA diam:        3.70 cm 1.77 cm/m  RA Area:     18.10 cm LA Vol (A2C):   45.6 ml 21.87 ml/m RA Volume:   52.40 ml  25.13 ml/m LA Vol (A4C):   46.9 ml 22.49 ml/m LA Biplane Vol: 47.2 ml 22.64 ml/m  AORTIC VALVE LVOT Vmax:   162.00 cm/s LVOT Vmean:  107.000 cm/s LVOT VTI:    0.318 m  AORTA Ao Root diam: 2.80 cm MITRAL VALVE                TRICUSPID  VALVE MV Area (PHT): 4.29 cm     TR Peak grad:   46.5 mmHg MV Decel Time: 177 msec     TR Vmax:        341.00 cm/s MV E velocity: 131.00 cm/s MV A velocity: 90.70 cm/s   SHUNTS MV E/A ratio:  1.44         Systemic VTI:  0.32 m                             Systemic Diam: 1.70 cm [name] MD Electronically signed by [name] MD Signature Date/Time: 01/19/2021/5:29:05 PM    Final         Scheduled Meds: .  bisacodyl  10 mg Rectal Once  . Chlorhexidine Gluconate Cloth  6 each Topical Daily  . docusate sodium  100 mg Oral BID  . fentaNYL  1 patch Transdermal Q72H  . gabapentin  400 mg Oral TID  . lidocaine  1 patch Transdermal Q24H  . multivitamin with minerals  1 tablet Oral Daily  . polyethylene glycol  17 g Oral BID  . Ensure Max Protein  11 oz Oral BID  . senna-docusate  1 tablet Oral BID  . sodium chloride flush  10-40 mL Intracatheter Q12H  . sodium chloride flush  3 mL Intravenous Q12H   Continuous Infusions: . cefTRIAXone (ROCEPHIN)  IV 2 g (01/20/21 0829)  . methocarbamol (ROBAXIN) IV 1,000 mg (01/20/21 2220)  . vancomycin 1,250 mg (01/20/21 1520)     LOS: 4 days    Time spent:25 mins. More than 50% of that time was spent in counseling and/or coordination of care.      Burnadette Pop, MD [facility] P4/05/2021, 7:56 AM

## 2021-01-21 NOTE — Progress Notes (Signed)
Providing Compassionate, Quality Care - Together   Subjective: Patient reports she feels a little better than yesterday. She was given Lasix yesterday and feels that made a big difference in the tightness she was feeling. She tells me the plan is for her to discharge home tomorrow.  Objective: Vital signs in last 24 hours: Temp:  [97.6 F (36.4 C)-98.1 F (36.7 C)] 97.6 F (36.4 C) (04/07 2121) Pulse Rate:  [79-83] 83 (04/07 2121) Resp:  [18] 18 (04/07 2121) BP: (120-160)/(67-80) 120/67 (04/07 2121) SpO2:  [93 %-96 %] 93 % (04/07 2121)  Intake/Output from previous day: 04/07 0701 - 04/08 0700 In: 790 [P.O.:240; IV Piggyback:550] Out: 2200 [Urine:2200] Intake/Output this shift: No intake/output data recorded.  Alert and oriented x 4 PERRLA CN II-XII grossly intact MAE, Strength and sensation intact  Lab Results: Recent Labs    01/20/21 0331  WBC 11.1*  HGB 10.7*  HCT 33.7*  PLT 350   BMET Recent Labs    01/20/21 0331  NA 136  K 3.4*  CL 101  CO2 29  GLUCOSE 94  BUN 11  CREATININE 0.57  CALCIUM 8.6*    Studies/Results: DG CHEST PORT 1 VIEW  Result Date: 01/19/2021 CLINICAL DATA:  Chest pain. EXAM: PORTABLE CHEST 1 VIEW COMPARISON:  Chest x-ray 01/17/2021.  CT chest 01/16/2021. FINDINGS: Cardiomegaly with pulmonary vascular prominence again noted. Mild left base atelectasis. No focal infiltrate. No pleural effusion or pneumothorax. IMPRESSION: Cardiomegaly with pulmonary vascular prominence noted. Mild left base atelectasis. No focal infiltrate. Electronically Signed   By: Maisie Fus  Register   On: 01/19/2021 13:21   ECHOCARDIOGRAM COMPLETE  Result Date: 01/19/2021    ECHOCARDIOGRAM REPORT   Patient Name:   Stacey Kaiser Date of Exam: 01/19/2021 Medical Rec #:  [id number]               Height:       58.0 in Accession #:    [id number]              Weight:       275.0 lb Date of Birth:  1971-09-08               BSA:          2.085 m Patient Age:    49 years                 BP:           146/68 mmHg Patient Gender: F                       HR:           104 bpm. Exam Location:  Inpatient Procedure: 2D Echo Indications:    CHF  History:        Patient has no prior history of Echocardiogram examinations.  Sonographer:    Thurman Coyer RDCS (AE) Referring Phys: [id number] AMRIT ADHIKARI IMPRESSIONS  1. Left ventricular ejection fraction, by estimation, is 55 to 60%. The left ventricle has normal function. The left ventricle has no regional wall motion abnormalities. Left ventricular diastolic parameters were normal. The lateral e' velocity is 10 cm/s.  2. Right ventricular systolic function is normal. The right ventricular size is normal. There is moderately elevated pulmonary artery systolic pressure. The estimated right ventricular systolic pressure is 49.5 mmHg.  3. The mitral valve is abnormal. Mild mitral valve regurgitation.  4. The aortic valve is tricuspid. Aortic valve regurgitation is not visualized.  5. The inferior vena cava is normal in size with greater than 50% respiratory variability, suggesting right atrial pressure of 3 mmHg. Comparison(s): No prior Echocardiogram. FINDINGS  Left Ventricle: Left ventricular ejection fraction, by estimation, is 55 to 60%. The left ventricle has normal function. The left ventricle has no regional wall motion abnormalities. The left ventricular internal cavity size was normal in size. There is  no left ventricular hypertrophy. Left ventricular diastolic parameters were normal. Indeterminate filling pressures. The E/e' is 10. Right Ventricle: The right ventricular size is normal. No increase in right ventricular wall thickness. Right ventricular systolic function is normal. There is moderately elevated pulmonary artery systolic pressure. The tricuspid regurgitant velocity is 3.41 m/s, and with an assumed right atrial pressure of 3 mmHg, the estimated right ventricular systolic pressure is 49.5 mmHg. Left Atrium: Left atrial  size was normal in size. Right Atrium: Right atrial size was normal in size. Pericardium: There is no evidence of pericardial effusion. Mitral Valve: The mitral valve is abnormal. There is mild thickening of the mitral valve leaflet(s). Mild mitral valve regurgitation. Tricuspid Valve: The tricuspid valve is grossly normal. Tricuspid valve regurgitation is mild. Aortic Valve: The aortic valve is tricuspid. Aortic valve regurgitation is not visualized. Pulmonic Valve: The pulmonic valve was normal in structure. Pulmonic valve regurgitation is not visualized. Aorta: The aortic root and ascending aorta are structurally normal, with no evidence of dilitation. Venous: The inferior vena cava is normal in size with greater than 50% respiratory variability, suggesting right atrial pressure of 3 mmHg. IAS/Shunts: No atrial level shunt detected by color flow Doppler.  LEFT VENTRICLE PLAX 2D LVIDd:         3.80 cm  Diastology LVIDs:         2.70 cm  LV e' medial:    8.59 cm/s LV PW:         1.00 cm  LV E/e' medial:  15.3 LV IVS:        0.90 cm  LV e' lateral:   10.40 cm/s LVOT diam:     1.70 cm  LV E/e' lateral: 12.6 LV SV:         72 LV SV Index:   35 LVOT Area:     2.27 cm  RIGHT VENTRICLE RV S prime:     17.30 cm/s TAPSE (M-mode): 2.0 cm LEFT ATRIUM             Index       RIGHT ATRIUM           Index LA diam:        3.70 cm 1.77 cm/m  RA Area:     18.10 cm LA Vol (A2C):   45.6 ml 21.87 ml/m RA Volume:   52.40 ml  25.13 ml/m LA Vol (A4C):   46.9 ml 22.49 ml/m LA Biplane Vol: 47.2 ml 22.64 ml/m  AORTIC VALVE LVOT Vmax:   162.00 cm/s LVOT Vmean:  107.000 cm/s LVOT VTI:    0.318 m  AORTA Ao Root diam: 2.80 cm MITRAL VALVE                TRICUSPID VALVE MV Area (PHT): 4.29 cm     TR Peak grad:   46.5 mmHg MV Decel Time: 177 msec     TR Vmax:        341.00 cm/s MV E velocity: 131.00 cm/s MV A velocity: 90.70 cm/s   SHUNTS MV E/A ratio:  1.44  Systemic VTI:  0.32 m                             Systemic Diam: 1.70 cm  [name] MD Electronically signed by [name] MD Signature Date/Time: 01/19/2021/5:29:05 PM    Final     Assessment/Plan: Patient with thoracic discitis, osteomyelitis, stenosis, and thoracic pain. ID managing antibiotics. Patient presently on vancomycin and ceftriaxone. Plan is for 6 week course   LOS: 4 days    -Encourage mobilization as tolerated. -No surgical intervention recommended. -Fine to discharge home when medically stable per primary team.   Val Eagle, DNP, AGNP-C Nurse Practitioner  [facility] [address], [place], [place] [address] P: [phone]    F: [phone]  01/21/2021, 10:01 AM

## 2021-01-22 LAB — CULTURE, BLOOD (ROUTINE X 2): Culture: NO GROWTH

## 2021-01-22 LAB — BASIC METABOLIC PANEL
Anion gap: 8 (ref 5–15)
BUN: 9 mg/dL (ref 6–20)
CO2: 33 mmol/L — ABNORMAL HIGH (ref 22–32)
Calcium: 9 mg/dL (ref 8.9–10.3)
Chloride: 96 mmol/L — ABNORMAL LOW (ref 98–111)
Creatinine, Ser: 0.72 mg/dL (ref 0.44–1.00)
GFR, Estimated: >60 mL/min (ref >60)
Glucose, Bld: 106 mg/dL — ABNORMAL HIGH (ref 70–99)
Potassium: 3.5 mmol/L (ref 3.5–5.1)
Sodium: 137 mmol/L (ref 135–145)

## 2021-01-22 LAB — VANCOMYCIN, TROUGH: Vancomycin Tr: 6 ug/mL — ABNORMAL LOW (ref 15–20)

## 2021-01-22 MED ORDER — VANCOMYCIN HCL 10 G IV SOLR
1750.0000 mg | INTRAVENOUS | Status: DC
  Administered 2021-01-22 – 2021-01-24 (×3): 1750 mg via INTRAVENOUS
  Filled 2021-01-22: qty 750
  Filled 2021-01-22: qty 1750
  Filled 2021-01-22: qty 750

## 2021-01-22 MED ORDER — FUROSEMIDE 10 MG/ML IJ SOLN
40.0000 mg | Freq: Two times a day (BID) | INTRAMUSCULAR | Status: DC
  Administered 2021-01-22 – 2021-01-23 (×4): 40 mg via INTRAVENOUS
  Filled 2021-01-22 (×4): qty 4

## 2021-01-22 MED ORDER — POTASSIUM CHLORIDE CRYS ER 20 MEQ PO TBCR
40.0000 meq | EXTENDED_RELEASE_TABLET | Freq: Two times a day (BID) | ORAL | Status: AC
  Administered 2021-01-22 (×2): 40 meq via ORAL
  Filled 2021-01-22 (×2): qty 2

## 2021-01-22 NOTE — Progress Notes (Addendum)
Pharmacy Antibiotic Note  Stacey Kaiser is a 50 y.o. female admitted on 01/16/2021 with discitis. She is s/p IR aspiration. Disc space was inaccessible so unfortunately cultures were not able to be obtained. Pharmacy has been consulted for empiric vancomycin dosing.  Pt is afebrile, WBC 11.1. Vanc trough today resulted in subtherapeutic level of 6. Spoke to IV team who drew the levels, technique was good. No issues with infusion from previous dose.   Will increase vancomycin dose to 1750 mg IV every 24 hours for eAUC of 567. OPAT orders updated.  Plan: - Increase vancomycin IV to 1750 mg every 24 hours (goal VT 15-20) - Continue ceftriaxone 2g IV every 24 hours - Monitor renal function and clinical progress - Obtain peak/trough levels when appropriate   Height: 4\' 10"  (147.3 cm) Weight: 124.7 kg (275 lb) IBW/kg (Calculated) : 40.9  Temp (24hrs), Avg:98 F (36.7 C), Min:98 F (36.7 C), Max:98.1 F (36.7 C)  Recent Labs  Lab 01/16/21 1631 01/18/21 0400 01/20/21 0331 01/21/21 0942 01/22/21 0435 01/22/21 0855  WBC 11.3* 11.6* 11.1*  --   --   --   CREATININE 0.71 0.62 0.57 0.65 0.72  --   VANCOTROUGH  --   --   --   --   --  6*    Estimated Creatinine Clearance: 98.8 mL/min (by C-G formula based on SCr of 0.72 mg/dL).    Allergies  Allergen Reactions  . Okra Hives  . Other     Bees and wasps    Antimicrobials this admission: Ceftriaxone 4/6 >>  Vancomycin 4/6 >>   Microbiology results: 4/3 BCx: ng <4 days     Stacey Kaiser L. 6/3, PharmD, MBA [facility] PGY2 Pharmacy Resident Weekends 7:00 am - 3:00 pm, please call [phone] 01/22/21     10:38 AM  Please check AMION for all [facility] Pharmacy phone numbers After 10:00 PM, call the Main Pharmacy [phone]

## 2021-01-22 NOTE — Plan of Care (Signed)
  Problem: Pain Managment: Goal: General experience of comfort will improve Outcome: Progressing   Problem: Safety: Goal: Ability to remain free from injury will improve Outcome: Progressing   

## 2021-01-22 NOTE — Progress Notes (Signed)
PROGRESS NOTE    Stacey Kaiser  QMV:[id number] DOB: [birth year] DOA: 01/16/2021 PCP: Patient, No Pcp Per (Inactive)   Chief Complain: Back pain  Brief Narrative: Patient is a 50 year old female with history of morbid obesity with BMI of 57 who presents with severe back pain.  She was seen and treated with different medications for back pain as an outpatient but it did not resolve so she presented to the emergency department.  CT imagings showed discitis and compression fracture at T8.  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  ID/IR/neurosurgery consulted.  Underwent disc biopsy by IR on 01/18/21. Started on IV antibiotics.  Hospital course remarkable for severe back pain with radiation. PT/OT evaluation recommending HH  Assessment & Plan:   Principal Problem:   Discitis Active Problems:   Morbid obesity with BMI of 50.0-59.9, adult (HCC)   Discitis/osteomyelitis: Imagings showed discitis/osteomyelitis on T8-T9 with compression fracture of T9 and also presence of epidural phlegmon.  ID/IR/neurosurgery consulted.  Underwent  T9 core biopsies  on 01/18/21 under general anesthesia .  Blood Cultures have been sent, will follow cultures,NGTD.  Surgical pathology did not show evidence of acute osteomyelitis. Has been started on IV antibiotics now.  ID recommended to continue vancomycin and ceftriaxone for 6 weeks and follow-up as an outpatient. Currently she is afebrile, has mild  Leukocytosis. Presented with severe back pain  On aggressive pain management.  Currently on Dilaudid, oxycodone, fentanyl, Robaxin.  We will continue to taper the pain medications, preparing for discharge. PT recommended home health.  Abdominal distention: Distended abdomen but denies any abdominal pain.  Abdominal imaging she did not show any intra-abdominal or intrapelvic pathology. Continue bowel regimen for constipation.  Morbid  obesity: BMI 57.48.  Weight loss, healthy diet encouraged  Elevated TSH: Mild elevated TSH of 7.7.  Normal free T4 and T3.  Will not start on Synthyroid.  Uninsured: TOC consulted  Hypokalemia: Potassium supplemented and being monitored  Respiratory insufficiency: Currently on 1L of nasal cannula oxygen.  No history of respiratory disease.  Chest x-ray some cardiomegaly with congestion and small bilateral pleural effusion.   Lungs are clear on auscultation.  BNP  elevated.  Echocardiogram showed normal left ventricular function with ejection fraction of 55 to 60%, normal diastolic parameters, moderately elevated pulmonary artery systolic pressure.  Continue to wean the oxygen. Her respiratory insufficiency could be contributed by undiagnosed OSA/obesity hypoventilation syndrome.  She needs to have a sleep study as an outpatient. Being given IV Lasix.  She might qualify for oxygen on discharge  Morbid obesity: BMI 57.4    Nutrition Problem: Increased nutrient needs Etiology: acute illness      DVT prophylaxis:Lovenox Code Status: Full Family Communication: None at the bedside Status is: Inpatient  Remains inpatient appropriate because:Inpatient level of care appropriate due to severity of illness   Dispo: The patient is from: Home              Anticipated d/c is to: Home on Monday              Patient currently is not medically stable to d/c.   Difficult to place patient No    Consultants: IR, neurosurgery, ID  Procedures:Plan doe disc aspiration  Antimicrobials:  Anti-infectives (From admission, onward)   Start     Dose/Rate Route Frequency Ordered Stop   01/20/21 1100  Vancomycin (VANCOCIN) 1,250 mg in sodium chloride 0.9 % 250 mL IVPB  1,250 mg 166.7 mL/hr over 90 Minutes Intravenous Every 24 hours 01/19/21 1012     01/19/21 1100  vancomycin (VANCOCIN) 2,500 mg in sodium chloride 0.9 % 500 mL IVPB        2,500 mg 250 mL/hr over 120 Minutes Intravenous  Once  01/19/21 0929 01/19/21 1339   01/19/21 1015  cefTRIAXone (ROCEPHIN) 2 g in sodium chloride 0.9 % 100 mL IVPB        2 g 200 mL/hr over 30 Minutes Intravenous Every 24 hours 01/19/21 0923     01/17/21 0400  vancomycin (VANCOCIN) 2,500 mg in sodium chloride 0.9 % 500 mL IVPB        2,500 mg 250 mL/hr over 120 Minutes Intravenous  Once 01/17/21 0350 01/17/21 7253      Subjective: Patient seen and examined the bedside this morning.  No new complaints.  Feels better.  She wants to continue physical therapy before discharge.  She does not have insurance so there will be difficulty in arranging home health, so maximizing physical therapy while she is inpatient.  Preparing plan for discharge home home on Monday  Objective: Vitals:   01/20/21 2121 01/21/21 1254 01/21/21 1959 01/22/21 0537  BP: 120/67 111/76 124/72 110/67  Pulse: 83 86 98 72  Resp: 18 18 18 18   Temp: 97.6 F (36.4 C) 98.1 F (36.7 C) 98 F (36.7 C) 98 F (36.7 C)  TempSrc: Oral Oral Oral Oral  SpO2: 93% 95% 97% 97%  Weight:      Height:        Intake/Output Summary (Last 24 hours) at 01/22/2021 0744 Last data filed at 01/21/2021 1300 Gross per 24 hour  Intake --  Output 1000 ml  Net -1000 ml   Filed Weights   01/16/21 1621  Weight: 124.7 kg    Examination:  General exam: Overall comfortable, not in distress,morbidly obese HEENT: PERRL Respiratory system:  Diminished air sounds on bases  Cardiovascular system: S1 & S2 heard, RRR.  Gastrointestinal system: Abdomen is nondistended, soft and nontender. Central nervous system: Alert and oriented Extremities: No edema, no clubbing ,no cyanosis Skin: No rashes, no ulcers,no icterus    Data Reviewed: I have personally reviewed following labs and imaging studies  CBC: Recent Labs  Lab 01/16/21 1631 01/18/21 0400 01/20/21 0331  WBC 11.3* 11.6* 11.1*  NEUTROABS 8.7*  --  8.6*  HGB 12.3 11.8* 10.7*  HCT 38.1 37.2 33.7*  MCV 92.3 95.4 93.9  PLT 404* 372 350    Basic Metabolic Panel: Recent Labs  Lab 01/16/21 1631 01/18/21 0400 01/20/21 0331 01/21/21 0942 01/22/21 0435  NA 140 137 136 137 137  K 3.9 3.9 3.4* 3.3* 3.5  CL 104 102 101 98 96*  CO2 26 25 29 30  33*  GLUCOSE 114* 108* 94 102* 106*  BUN 9 10 11 7 9   CREATININE 0.71 0.62 0.57 0.65 0.72  CALCIUM 9.1 9.1 8.6* 8.8* 9.0   GFR: Estimated Creatinine Clearance: 98.8 mL/min (by C-G formula based on SCr of 0.72 mg/dL). Liver Function Tests: Recent Labs  Lab 01/16/21 1631  AST 12*  ALT 14  ALKPHOS 69  BILITOT 1.0  PROT 7.1  ALBUMIN 3.9   Recent Labs  Lab 01/16/21 1631  LIPASE 13   No results for input(s): AMMONIA in the last 168 hours. Coagulation Profile: Recent Labs  Lab 01/17/21 1210  INR 1.0   Cardiac Enzymes: No results for input(s): CKTOTAL, CKMB, CKMBINDEX, TROPONINI in the last 168 hours. BNP (last  3 results) No results for input(s): PROBNP in the last 8760 hours. HbA1C: No results for input(s): HGBA1C in the last 72 hours. CBG: No results for input(s): GLUCAP in the last 168 hours. Lipid Profile: No results for input(s): CHOL, HDL, LDLCALC, TRIG, CHOLHDL, LDLDIRECT in the last 72 hours. Thyroid Function Tests: No results for input(s): TSH, T4TOTAL, FREET4, T3FREE, THYROIDAB in the last 72 hours. Anemia Panel: No results for input(s): VITAMINB12, FOLATE, FERRITIN, TIBC, IRON, RETICCTPCT in the last 72 hours. Sepsis Labs: No results for input(s): PROCALCITON, LATICACIDVEN in the last 168 hours.  Recent Results (from the past 240 hour(s))  Resp Panel by RT-PCR (Flu A&B, Covid) Nasopharyngeal Swab     Status: None   Collection Time: 01/16/21  7:11 PM   Specimen: Nasopharyngeal Swab; Nasopharyngeal(NP) swabs in vial transport medium  Result Value Ref Range Status   SARS Coronavirus 2 by RT PCR NEGATIVE NEGATIVE Final    Comment: (NOTE) SARS-CoV-2 target nucleic acids are NOT DETECTED.  The SARS-CoV-2 RNA is generally detectable in upper  respiratory specimens during the acute phase of infection. The lowest concentration of SARS-CoV-2 viral copies this assay can detect is 138 copies/mL. A negative result does not preclude SARS-Cov-2 infection and should not be used as the sole basis for treatment or other patient management decisions. A negative result may occur with  improper specimen collection/handling, submission of specimen other than nasopharyngeal swab, presence of viral mutation(s) within the areas targeted by this assay, and inadequate number of viral copies(<138 copies/mL). A negative result must be combined with clinical observations, patient history, and epidemiological information. The expected result is Negative.  Fact Sheet for Patients:  BloggerCourse.comhttps://www.fda.gov/media/152166/download  Fact Sheet for Healthcare Providers:  SeriousBroker.ithttps://www.fda.gov/media/152162/download  This test is no t yet approved or cleared by the [place] FDA and  has been authorized for detection and/or diagnosis of SARS-CoV-2 by FDA under an Emergency Use Authorization (EUA). This EUA will remain  in effect (meaning this test can be used) for the duration of the COVID-19 declaration under Section 564(b)(1) of the Act, 21 U.S.C.section 360bbb-3(b)(1), unless the authorization is terminated  or revoked sooner.       Influenza A by PCR NEGATIVE NEGATIVE Final   Influenza B by PCR NEGATIVE NEGATIVE Final    Comment: (NOTE) The Xpert Xpress SARS-CoV-2/FLU/RSV plus assay is intended as an aid in the diagnosis of influenza from Nasopharyngeal swab specimens and should not be used as a sole basis for treatment. Nasal washings and aspirates are unacceptable for Xpert Xpress SARS-CoV-2/FLU/RSV testing.  Fact Sheet for Patients: BloggerCourse.comhttps://www.fda.gov/media/152166/download  Fact Sheet for Healthcare Providers: SeriousBroker.ithttps://www.fda.gov/media/152162/download  This test is not yet approved or cleared by the [place] FDA and has been  authorized for detection and/or diagnosis of SARS-CoV-2 by FDA under an Emergency Use Authorization (EUA). This EUA will remain in effect (meaning this test can be used) for the duration of the COVID-19 declaration under Section 564(b)(1) of the Act, 21 U.S.C. section 360bbb-3(b)(1), unless the authorization is terminated or revoked.  Performed at [facility]   Blood culture (routine x 2)     Status: None (Preliminary result)   Collection Time: 01/16/21 11:37 PM   Specimen: BLOOD  Result Value Ref Range Status   Specimen Description BLOOD RIGHT ANTECUBITAL  Final   Special Requests   Final    BOTTLES DRAWN AEROBIC AND ANAEROBIC Blood Culture results may not be optimal due to an inadequate volume of blood received in culture bottles  Culture   Final    NO GROWTH 4 DAYS Performed at [facility], [address] [place], [place] [address]    Report Status PENDING  Incomplete  Blood culture (routine x 2)     Status: None (Preliminary result)   Collection Time: 01/16/21 11:40 PM   Specimen: BLOOD  Result Value Ref Range Status   Specimen Description BLOOD RIGHT ANTECUBITAL  Final   Special Requests   Final    BOTTLES DRAWN AEROBIC AND ANAEROBIC Blood Culture results may not be optimal due to an inadequate volume of blood received in culture bottles   Culture   Final    NO GROWTH 4 DAYS Performed at [facility], [address], [place], [place] [address]    Report Status PENDING  Incomplete         Radiology Studies: No results found.      Scheduled Meds: . acetaminophen  1,000 mg Oral TID  . bisacodyl  10 mg Rectal Once  . Chlorhexidine Gluconate Cloth  6 each Topical Daily  . docusate sodium  100 mg Oral BID  . enoxaparin (LOVENOX) injection  60 mg Subcutaneous Q24H  . fentaNYL  1 patch Transdermal Q72H  . gabapentin  600 mg Oral TID  . lidocaine  1 patch Transdermal Q24H  . multivitamin with minerals  1 tablet Oral Daily  .  polyethylene glycol  17 g Oral BID  . Ensure Max Protein  11 oz Oral BID  . senna-docusate  1 tablet Oral BID  . sodium chloride flush  10-40 mL Intracatheter Q12H  . sodium chloride flush  3 mL Intravenous Q12H   Continuous Infusions: . cefTRIAXone (ROCEPHIN)  IV 2 g (01/21/21 0852)  . methocarbamol (ROBAXIN) IV 1,000 mg (01/20/21 2220)  . vancomycin 1,250 mg (01/21/21 1005)     LOS: 5 days    Time spent:25 mins. More than 50% of that time was spent in counseling and/or coordination of care.      Burnadette Pop, MD [facility] P4/06/2021, 7:44 AM

## 2021-01-22 NOTE — Progress Notes (Addendum)
Physical Therapy Treatment Patient Details Name: Stacey Kaiser MRN: [id number] DOB: 08-Aug-1971 Today's Date: 01/22/2021    History of Present Illness Pt adm 4/3 with upper back pain. 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 Neurosurgery and ID consulted. Pt started on IV antibiotics. Biopsies performed in radiology on 4/5. PMH - morbid obesity.    PT Comments    Pt received in supine, agreeable to therapy session with encouragement and with good participation and tolerance for mobility, limited mainly due to anxiety regarding mobility and pain in LLQ. Pt noted to be incontinent of bowels upon staff arrival to room but after cleanup able to pivot to chair from EOB with up to +48modA. She needed some instruction on log rolling and needs reinforcement for compliance with back precautions. Pt anxious throughout and max cues for proper breathing needed but VSS on RA with exertion and at rest in chair, she reports pain not well controlled yet. Pt continues to benefit from PT services to progress toward functional mobility goals. Continue to recommend HHPT, pending progress.   Follow Up Recommendations  Home health PT;Supervision/Assistance - 24 hour     Equipment Recommendations  Other (comment) (TBD) (will likely need bariatric BSC and RW but will continue to assess, pending progress, may need WC)    Recommendations for Other Services       Precautions / Restrictions Precautions Precautions: Fall;Back Precaution Comments: reviewed spinal precautions/log rolling Restrictions Weight Bearing Restrictions: No    Mobility  Bed Mobility Overal bed mobility: Needs Assistance Bed Mobility: Rolling;Sidelying to Sit;Sit to Sidelying Rolling: Min guard Sidelying to sit: Mod assist;+2 for safety/equipment       General bed mobility comments: used RUE on bed rail and log roll to R EOB, heavy reliance on bed features/rail to perform; able to sit up with only  +1 assist but pt feels better having second person present due to anxiety    Transfers Overall transfer level: Needs assistance Equipment used: Rolling walker (2 wheeled);1 person hand held assist Transfers: Sit to/from Stand;Lateral/Scoot Transfers Sit to Stand: Mod assist;+2 safety/equipment;From elevated surface         General transfer comment: from EOB<>RW  Ambulation/Gait Ambulation/Gait assistance: Mod assist;+2 physical assistance;+2 safety/equipment Gait Distance (Feet): 3 Feet Assistive device: Rolling walker (2 wheeled) Gait Pattern/deviations: Step-to pattern;Shuffle;Trunk flexed;Wide base of support     General Gait Details: cues needed for breathing/posture, pt anxious and near-buckling multiple times but able to perform pivot with step by step sequencing and up to modA for support due to pain/anxiety; difficulty using BUE to offload pain in L leg/side and standing up tall   Stairs             Wheelchair Mobility    Modified Rankin (Stroke Patients Only)       Balance Overall balance assessment: Mild deficits observed, not formally tested                                          Cognition Arousal/Alertness: Awake/alert Behavior During Therapy: [facility] for tasks assessed/performed;Anxious Overall Cognitive Status: Within Functional Limits for tasks assessed                                 General Comments: pt needs cues t/o for pursed-lip breathing as she tends to hyperventilate/hold  breath when performing mobility tasks due to pain; very anxious regarding all mobility tasks but able to do more than she thinks; 1-step cues as pt overwhelmed with multi-step instructions      Exercises General Exercises - Lower Extremity Ankle Circles/Pumps: AROM;Both;10 reps;Supine Gluteal Sets: AROM;Both;Supine;10 reps Long Arc Quad: AROM;Both;10 reps;Seated    General Comments General comments (skin integrity, edema, etc.): very  anxious, needs heavy cues for breathing t/o; VSS on RA (SpO2 90% briefly during pivot but mostly 93-97% on RA) HR 87 bpm supine/resting, 117 bpm seated EOB and up to 130 bpm during pivot transfer; pt perseverating on vital signs and needed redirection to task/to breathe      Pertinent Vitals/Pain Pain Assessment: 0-10 Pain Score: 8  Pain Location: LUQ/LLQ throughout session Pain Descriptors / Indicators: Burning;Aching;Stabbing Pain Intervention(s): Monitored during session;Limited activity within patient's tolerance;Premedicated before session;Repositioned;Ice applied;Patient requesting pain meds-RN notified    Home Living                      Prior Function            PT Goals (current goals can now be found in the care plan section) Acute Rehab PT Goals Patient Stated Goal: Decr pain so she can move around PT Goal Formulation: With patient Time For Goal Achievement: 02/03/21 Potential to Achieve Goals: Fair Progress towards PT goals: Progressing toward goals    Frequency    Min 3X/week      PT Plan Current plan remains appropriate    Co-evaluation              AM-PAC PT "6 Clicks" Mobility   Outcome Measure  Help needed turning from your back to your side while in a flat bed without using bedrails?: A Little Help needed moving from lying on your back to sitting on the side of a flat bed without using bedrails?: A Lot Help needed moving to and from a bed to a chair (including a wheelchair)?: A Lot Help needed standing up from a chair using your arms (e.g., wheelchair or bedside chair)?: A Lot Help needed to walk in hospital room?: Total Help needed climbing 3-5 steps with a railing? : Total 6 Click Score: 11    End of Session Equipment Utilized During Treatment: Gait belt Activity Tolerance: Patient tolerated treatment well;Other (comment) (significantly anxious, pain limited) Patient left: with call bell/phone within reach;in chair;with chair alarm  set (chair reclined for comfort, tray table in front of her) Nurse Communication: Mobility status PT Visit Diagnosis: Pain;Other abnormalities of gait and mobility (R26.89) Pain - Right/Left:  (back and LLQ) Pain - part of body:  (spine)     Time: 2376-2831 PT Time Calculation (min) (ACUTE ONLY): 30 min  Charges:  $Therapeutic Exercise: 8-22 mins $Therapeutic Activity: 8-22 mins                    Jamicah Anstead P., PTA Acute Rehabilitation Services Pager: [phone] Office: [phone]   Angus Palms 01/22/2021, 5:33 PM

## 2021-01-22 NOTE — Progress Notes (Signed)
Subjective: Patient reports still sore around ribs, but better with increased dose of gabapentin.  Objective: Vital signs in last 24 hours: Temp:  [98 F (36.7 C)-98.1 F (36.7 C)] 98 F (36.7 C) (04/09 0537) Pulse Rate:  [72-98] 72 (04/09 0537) Resp:  [18] 18 (04/09 0537) BP: (110-124)/(67-76) 110/67 (04/09 0537) SpO2:  [95 %-97 %] 97 % (04/09 0537)  Intake/Output from previous day: 04/08 0701 - 04/09 0700 In: -  Out: 1000 [Urine:1000] Intake/Output this shift: No intake/output data recorded.  Physical Exam: Laying comfortably in bed.  No complaints at present.  Lab Results: Recent Labs    01/20/21 0331  WBC 11.1*  HGB 10.7*  HCT 33.7*  PLT 350   BMET Recent Labs    01/21/21 0942 01/22/21 0435  NA 137 137  K 3.3* 3.5  CL 98 96*  CO2 30 33*  GLUCOSE 102* 106*  BUN 7 9  CREATININE 0.65 0.72  CALCIUM 8.8* 9.0    Studies/Results: No results found.  Assessment/Plan: Continue to mobilize with PT.  Long term ABX per ID for discitis.    LOS: 5 days    Dorian Heckle, MD 01/22/2021, 8:59 AM

## 2021-01-23 LAB — BASIC METABOLIC PANEL
Anion gap: 8 (ref 5–15)
BUN: 13 mg/dL (ref 6–20)
CO2: 33 mmol/L — ABNORMAL HIGH (ref 22–32)
Calcium: 9.1 mg/dL (ref 8.9–10.3)
Chloride: 97 mmol/L — ABNORMAL LOW (ref 98–111)
Creatinine, Ser: 0.83 mg/dL (ref 0.44–1.00)
GFR, Estimated: >60 mL/min (ref >60)
Glucose, Bld: 119 mg/dL — ABNORMAL HIGH (ref 70–99)
Potassium: 3.8 mmol/L (ref 3.5–5.1)
Sodium: 138 mmol/L (ref 135–145)

## 2021-01-23 NOTE — Progress Notes (Signed)
PROGRESS NOTE    Stacey Kaiser  XBM:[id number] DOB: 09/14/1971 DOA: 01/16/2021 PCP: Patient, No Pcp Per (Inactive)   Chief Complain: Back pain  Brief Narrative: Patient is a 50 year old female with history of morbid obesity with BMI of 57 who presents with severe back pain.  She was seen and treated with different medications for back pain as an outpatient but it did not resolve so she presented to the emergency department.  CT imagings showed discitis and compression fracture at T8.  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  ID/IR/neurosurgery consulted.  Underwent disc biopsy by IR on 01/18/21. Started on IV antibiotics.  Hospital course remarkable for severe back pain with radiation. PT/OT evaluation recommending HH.  Plan for discharge tomorrow.  Assessment & Plan:   Principal Problem:   Discitis Active Problems:   Morbid obesity with BMI of 50.0-59.9, adult (HCC)   Discitis/osteomyelitis: Imagings showed discitis/osteomyelitis on T8-T9 with compression fracture of T9 and also presence of epidural phlegmon.  ID/IR/neurosurgery consulted.  Underwent  T9 core biopsies  on 01/18/21 under general anesthesia .  Blood Cultures have been sent, will follow cultures,NGTD.  Surgical pathology did not show evidence of acute osteomyelitis. Has been started on IV antibiotics now.  ID recommended to continue vancomycin and ceftriaxone for 6 weeks and follow-up as an outpatient. Currently she is afebrile, has mild  Leukocytosis. Presented with severe back pain  On aggressive pain management but tapering.  Currently on Dilaudid, oxycodone,, Robaxin.  We will continue to taper the pain medications, preparing for discharge. PT recommended home health.  Abdominal distention: Distended abdomen but denies any abdominal pain.  Abdominal imaging she did not show any intra-abdominal or intrapelvic pathology. Continue bowel  regimen for constipation.  Morbid obesity: BMI 57.48.  Weight loss, healthy diet encouraged  Elevated TSH: Mild elevated TSH of 7.7.  Normal free T4 and T3.  Will not start on Synthyroid.  Uninsured: TOC consulted  Hypokalemia: Potassium supplemented and being monitored  Respiratory insufficiency: Currently on 1-2L of nasal cannula oxygen.  No history of respiratory disease.  Chest x-ray some cardiomegaly with congestion and small bilateral pleural effusion.   Lungs are clear on auscultation.  BNP  elevated.  Echocardiogram showed normal left ventricular function with ejection fraction of 55 to 60%, normal diastolic parameters, moderately elevated pulmonary artery systolic pressure.  Continue to wean the oxygen. Her respiratory insufficiency could be contributed by undiagnosed OSA/obesity hypoventilation syndrome versus pulmonary artery hypertension.  She needs to have a sleep study as an outpatient. Being given IV Lasix with a goal to decrease oxygen requirement.  She might qualify for oxygen on discharge  Morbid obesity: BMI 57.4    Nutrition Problem: Increased nutrient needs Etiology: acute illness      DVT prophylaxis:Lovenox Code Status: Full Family Communication: None at the bedside Status is: Inpatient  Remains inpatient appropriate because:Inpatient level of care appropriate due to severity of illness   Dispo: The patient is from: Home              Anticipated d/c is to: Home on Monday              Patient currently is not medically stable to d/c.   Difficult to place patient No    Consultants: IR, neurosurgery, ID  Procedures:Plan doe disc aspiration  Antimicrobials:  Anti-infectives (From admission, onward)   Start     Dose/Rate Route Frequency Ordered Stop   01/22/21  1031  vancomycin (VANCOCIN) 1,750 mg in sodium chloride 0.9 % 500 mL IVPB        1,750 mg 250 mL/hr over 120 Minutes Intravenous Every 24 hours 01/22/21 1031     01/20/21 1100  Vancomycin  (VANCOCIN) 1,250 mg in sodium chloride 0.9 % 250 mL IVPB  Status:  Discontinued        1,250 mg 166.7 mL/hr over 90 Minutes Intravenous Every 24 hours 01/19/21 1012 01/22/21 1031   01/19/21 1100  vancomycin (VANCOCIN) 2,500 mg in sodium chloride 0.9 % 500 mL IVPB        2,500 mg 250 mL/hr over 120 Minutes Intravenous  Once 01/19/21 0929 01/19/21 1339   01/19/21 1015  cefTRIAXone (ROCEPHIN) 2 g in sodium chloride 0.9 % 100 mL IVPB        2 g 200 mL/hr over 30 Minutes Intravenous Every 24 hours 01/19/21 0923     01/17/21 0400  vancomycin (VANCOCIN) 2,500 mg in sodium chloride 0.9 % 500 mL IVPB        2,500 mg 250 mL/hr over 120 Minutes Intravenous  Once 01/17/21 0350 01/17/21 5053      Subjective: Patient seen and examined the bedside this morning.  Hemodynamically stable.  Back pain is still there but slowly getting better.  I encouraged her to get up of the bed and sit on the chair Objective: Vitals:   01/22/21 0537 01/22/21 1430 01/22/21 1935 01/23/21 0544  BP: 110/67 115/70 118/79 129/82  Pulse: 72 80 85 90  Resp: 18  18 18   Temp: 98 F (36.7 C) 98.7 F (37.1 C) 98.5 F (36.9 C) 98.2 F (36.8 C)  TempSrc: Oral Oral Oral Oral  SpO2: 97%  99% 93%  Weight:      Height:        Intake/Output Summary (Last 24 hours) at 01/23/2021 0800 Last data filed at 01/23/2021 0200 Gross per 24 hour  Intake --  Output 900 ml  Net -900 ml   Filed Weights   01/16/21 1621  Weight: 124.7 kg    Examination:  General exam: Overall comfortable, not in distress,morbidly obese HEENT: PERRL Respiratory system:  no wheezes or crackles  Cardiovascular system: S1 & S2 heard, RRR.  Gastrointestinal system: Abdomen is distended, soft and nontender. Central nervous system: Alert and oriented Extremities: No edema, no clubbing ,no cyanosis Skin: No rashes, no ulcers,no icterus     Data Reviewed: I have personally reviewed following labs and imaging studies  CBC: Recent Labs  Lab  01/16/21 1631 01/18/21 0400 01/20/21 0331  WBC 11.3* 11.6* 11.1*  NEUTROABS 8.7*  --  8.6*  HGB 12.3 11.8* 10.7*  HCT 38.1 37.2 33.7*  MCV 92.3 95.4 93.9  PLT 404* 372 350   Basic Metabolic Panel: Recent Labs  Lab 01/18/21 0400 01/20/21 0331 01/21/21 0942 01/22/21 0435 01/23/21 0323  NA 137 136 137 137 138  K 3.9 3.4* 3.3* 3.5 3.8  CL 102 101 98 96* 97*  CO2 25 29 30  33* 33*  GLUCOSE 108* 94 102* 106* 119*  BUN 10 11 7 9 13   CREATININE 0.62 0.57 0.65 0.72 0.83  CALCIUM 9.1 8.6* 8.8* 9.0 9.1   GFR: Estimated Creatinine Clearance: 95.2 mL/min (by C-G formula based on SCr of 0.83 mg/dL). Liver Function Tests: Recent Labs  Lab 01/16/21 1631  AST 12*  ALT 14  ALKPHOS 69  BILITOT 1.0  PROT 7.1  ALBUMIN 3.9   Recent Labs  Lab 01/16/21 1631  LIPASE  13   No results for input(s): AMMONIA in the last 168 hours. Coagulation Profile: Recent Labs  Lab 01/17/21 1210  INR 1.0   Cardiac Enzymes: No results for input(s): CKTOTAL, CKMB, CKMBINDEX, TROPONINI in the last 168 hours. BNP (last 3 results) No results for input(s): PROBNP in the last 8760 hours. HbA1C: No results for input(s): HGBA1C in the last 72 hours. CBG: No results for input(s): GLUCAP in the last 168 hours. Lipid Profile: No results for input(s): CHOL, HDL, LDLCALC, TRIG, CHOLHDL, LDLDIRECT in the last 72 hours. Thyroid Function Tests: No results for input(s): TSH, T4TOTAL, FREET4, T3FREE, THYROIDAB in the last 72 hours. Anemia Panel: No results for input(s): VITAMINB12, FOLATE, FERRITIN, TIBC, IRON, RETICCTPCT in the last 72 hours. Sepsis Labs: No results for input(s): PROCALCITON, LATICACIDVEN in the last 168 hours.  Recent Results (from the past 240 hour(s))  Resp Panel by RT-PCR (Flu A&B, Covid) Nasopharyngeal Swab     Status: None   Collection Time: 01/16/21  7:11 PM   Specimen: Nasopharyngeal Swab; Nasopharyngeal(NP) swabs in vial transport medium  Result Value Ref Range Status   SARS  Coronavirus 2 by RT PCR NEGATIVE NEGATIVE Final    Comment: (NOTE) SARS-CoV-2 target nucleic acids are NOT DETECTED.  The SARS-CoV-2 RNA is generally detectable in upper respiratory specimens during the acute phase of infection. The lowest concentration of SARS-CoV-2 viral copies this assay can detect is 138 copies/mL. A negative result does not preclude SARS-Cov-2 infection and should not be used as the sole basis for treatment or other patient management decisions. A negative result may occur with  improper specimen collection/handling, submission of specimen other than nasopharyngeal swab, presence of viral mutation(s) within the areas targeted by this assay, and inadequate number of viral copies(<138 copies/mL). A negative result must be combined with clinical observations, patient history, and epidemiological information. The expected result is Negative.  Fact Sheet for Patients:  BloggerCourse.com  Fact Sheet for Healthcare Providers:  SeriousBroker.it  This test is no t yet approved or cleared by the [place] FDA and  has been authorized for detection and/or diagnosis of SARS-CoV-2 by FDA under an Emergency Use Authorization (EUA). This EUA will remain  in effect (meaning this test can be used) for the duration of the COVID-19 declaration under Section 564(b)(1) of the Act, 21 U.S.C.section 360bbb-3(b)(1), unless the authorization is terminated  or revoked sooner.       Influenza A by PCR NEGATIVE NEGATIVE Final   Influenza B by PCR NEGATIVE NEGATIVE Final    Comment: (NOTE) The Xpert Xpress SARS-CoV-2/FLU/RSV plus assay is intended as an aid in the diagnosis of influenza from Nasopharyngeal swab specimens and should not be used as a sole basis for treatment. Nasal washings and aspirates are unacceptable for Xpert Xpress SARS-CoV-2/FLU/RSV testing.  Fact Sheet for  Patients: BloggerCourse.com  Fact Sheet for Healthcare Providers: SeriousBroker.it  This test is not yet approved or cleared by the [place] FDA and has been authorized for detection and/or diagnosis of SARS-CoV-2 by FDA under an Emergency Use Authorization (EUA). This EUA will remain in effect (meaning this test can be used) for the duration of the COVID-19 declaration under Section 564(b)(1) of the Act, 21 U.S.C. section 360bbb-3(b)(1), unless the authorization is terminated or revoked.  Performed at [facility]   Blood culture (routine x 2)     Status: None   Collection Time: 01/16/21 11:37 PM   Specimen: BLOOD  Result Value Ref Range Status  Specimen Description BLOOD RIGHT ANTECUBITAL  Final   Special Requests   Final    BOTTLES DRAWN AEROBIC AND ANAEROBIC Blood Culture results may not be optimal due to an inadequate volume of blood received in culture bottles   Culture   Final    NO GROWTH 5 DAYS Performed at [facility], [address] [place], [place] [address]    Report Status 01/22/2021 FINAL  Final  Blood culture (routine x 2)     Status: None   Collection Time: 01/16/21 11:40 PM   Specimen: BLOOD  Result Value Ref Range Status   Specimen Description BLOOD RIGHT ANTECUBITAL  Final   Special Requests   Final    BOTTLES DRAWN AEROBIC AND ANAEROBIC Blood Culture results may not be optimal due to an inadequate volume of blood received in culture bottles   Culture   Final    NO GROWTH 5 DAYS Performed at [facility], [address] [place], [place] [address]    Report Status 01/22/2021 FINAL  Final         Radiology Studies: No results found.      Scheduled Meds: . acetaminophen  1,000 mg Oral TID  . bisacodyl  10 mg Rectal Once  . Chlorhexidine Gluconate Cloth  6 each Topical Daily  . docusate sodium  100 mg Oral BID  . enoxaparin (LOVENOX) injection  60 mg  Subcutaneous Q24H  . furosemide  40 mg Intravenous Q12H  . gabapentin  600 mg Oral TID  . lidocaine  1 patch Transdermal Q24H  . multivitamin with minerals  1 tablet Oral Daily  . polyethylene glycol  17 g Oral BID  . Ensure Max Protein  11 oz Oral BID  . senna-docusate  1 tablet Oral BID  . sodium chloride flush  10-40 mL Intracatheter Q12H  . sodium chloride flush  3 mL Intravenous Q12H   Continuous Infusions: . cefTRIAXone (ROCEPHIN)  IV 2 g (01/22/21 0924)  . methocarbamol (ROBAXIN) IV 1,000 mg (01/20/21 2220)  . vancomycin 1,750 mg (01/22/21 1343)     LOS: 6 days    Time spent:25 mins. More than 50% of that time was spent in counseling and/or coordination of care.      Burnadette Pop, MD [facility] P4/07/2021, 8:00 AM

## 2021-01-24 LAB — BASIC METABOLIC PANEL
Anion gap: 10 (ref 5–15)
BUN: 16 mg/dL (ref 6–20)
CO2: 29 mmol/L (ref 22–32)
Calcium: 9.2 mg/dL (ref 8.9–10.3)
Chloride: 99 mmol/L (ref 98–111)
Creatinine, Ser: 0.87 mg/dL (ref 0.44–1.00)
GFR, Estimated: >60 mL/min (ref >60)
Glucose, Bld: 121 mg/dL — ABNORMAL HIGH (ref 70–99)
Potassium: 3.5 mmol/L (ref 3.5–5.1)
Sodium: 138 mmol/L (ref 135–145)

## 2021-01-24 MED ORDER — VANCOMYCIN IV (FOR PTA / DISCHARGE USE ONLY): 1000.0000 mg | Freq: Two times a day (BID) | INTRAVENOUS | 0 refills | Status: AC

## 2021-01-24 MED ORDER — HEPARIN SOD (PORK) LOCK FLUSH 100 UNIT/ML IV SOLN
250.0000 [IU] | INTRAVENOUS | Status: AC | PRN
  Administered 2021-01-24: 250 [IU]
  Filled 2021-01-24: qty 2.5

## 2021-01-24 MED ORDER — VANCOMYCIN IV (FOR PTA / DISCHARGE USE ONLY): 1750.0000 mg | INTRAVENOUS | 0 refills | Status: DC

## 2021-01-24 MED ORDER — POLYETHYLENE GLYCOL 3350 17 G PO PACK: 17.0000 g | PACK | Freq: Every day | ORAL | 0 refills | Status: DC

## 2021-01-24 MED ORDER — VANCOMYCIN HCL IN DEXTROSE 1-5 GM/200ML-% IV SOLN
1000.0000 mg | Freq: Two times a day (BID) | INTRAVENOUS | Status: DC
  Filled 2021-01-24: qty 200

## 2021-01-24 MED ORDER — HYDROCODONE-ACETAMINOPHEN 5-325 MG PO TABS: 2.0000 | ORAL_TABLET | ORAL | 0 refills | Status: DC | PRN

## 2021-01-24 MED ORDER — LIDOCAINE 5 % EX PTCH: 1.0000 | MEDICATED_PATCH | CUTANEOUS | 0 refills | Status: DC

## 2021-01-24 MED ORDER — METHOCARBAMOL 500 MG PO TABS: 500.0000 mg | ORAL_TABLET | Freq: Four times a day (QID) | ORAL | 0 refills | Status: DC | PRN

## 2021-01-24 MED ORDER — FUROSEMIDE 20 MG PO TABS: 20.0000 mg | ORAL_TABLET | Freq: Every day | ORAL | 0 refills | Status: DC

## 2021-01-24 MED ORDER — CEFTRIAXONE IV (FOR PTA / DISCHARGE USE ONLY): 2.0000 g | INTRAVENOUS | 0 refills | Status: DC

## 2021-01-24 MED ORDER — POTASSIUM CHLORIDE ER 20 MEQ PO TBCR: 20.0000 meq | EXTENDED_RELEASE_TABLET | Freq: Every day | ORAL | 0 refills | Status: DC

## 2021-01-24 MED ORDER — GABAPENTIN 300 MG PO CAPS: 600.0000 mg | ORAL_CAPSULE | Freq: Three times a day (TID) | ORAL | 1 refills | Status: DC

## 2021-01-24 NOTE — Progress Notes (Signed)
Discharge summary packet provided to pt with instructions. Pt verbalized understanding of instructions.. No complaints voiced. Pt D/C to home with [facility] as ordered. Pt remains alert/oriented in no apparent distress. All questions and concerns were fully answered. Spouse is responsible for her ride.

## 2021-01-24 NOTE — Progress Notes (Signed)
Physical Therapy Treatment Patient Details Name: Stacey Kaiser MRN: [id number] DOB: 02/04/1971 Today's Date: 01/24/2021    History of Present Illness Pt adm 4/3 with upper back pain. 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 Neurosurgery and ID consulted. Pt started on IV antibiotics. Biopsies performed in radiology on 4/5. PMH - morbid obesity.    PT Comments    Pt was seen for mobility on RW to get to BR then required help to get cleaned up.  Pt is demonstrating a limited awareness of how to manage clean up in BR, and her tools on counter were not utilized today.  Pt will need follow up therapy at home, esp to work on standing stability and strength of LE's, to increase her recruitment of safe body mechanics, to increase standing balance and to improve her control of gait esp on unlevel surfaces.  Follow for acute PT goals.  Follow Up Recommendations  Home health PT;Supervision/Assistance - 24 hour     Equipment Recommendations  Rolling walker with 5" wheels;3in1 (PT) (arrived as PT session ended)    Recommendations for Other Services       Precautions / Restrictions Precautions Precautions: Fall;Back Precaution Booklet Issued: No Precaution Comments: pt able to state 3/3 precautions by end of session Restrictions Weight Bearing Restrictions: No Other Position/Activity Restrictions: body mechanics training    Mobility  Bed Mobility Overal bed mobility: Needs Assistance Bed Mobility: Rolling;Sidelying to Sit Rolling: Min guard Sidelying to sit: Mod assist       General bed mobility comments: in chair when PT arrived    Transfers Overall transfer level: Needs assistance Equipment used: Rolling walker (2 wheeled) Transfers: Sit to/from [employer] Sit to Stand: Min assist Stand pivot transfers: Min assist       General transfer comment: min assist from recliner and BSC  Ambulation/Gait Ambulation/Gait  assistance: Min guard;Min assist Gait Distance (Feet): 38 Feet (28+10) Assistive device: Rolling walker (2 wheeled);1 person hand held assist Gait Pattern/deviations: Step-through pattern;Step-to pattern;Decreased stride length;Wide base of support Gait velocity: reduced Gait velocity interpretation: <1.31 ft/sec, indicative of household ambulator General Gait Details: mild LE buckling but did not give out, kept recliner behind pt for safety   Stairs Stairs:  (declined)           Wheelchair Mobility    Modified Rankin (Stroke Patients Only)       Balance Overall balance assessment: Needs assistance Sitting-balance support: Bilateral upper extremity supported;Feet supported Sitting balance-Leahy Scale: Fair     Standing balance support: Bilateral upper extremity supported;During functional activity Standing balance-Leahy Scale: Poor Standing balance comment: requires walker for safety                            Cognition Arousal/Alertness: Awake/alert Behavior During Therapy: WFL for tasks assessed/performed;Anxious Overall Cognitive Status: Within Functional Limits for tasks assessed                                        Exercises General Exercises - Lower Extremity Ankle Circles/Pumps: AROM;5 reps Quad Sets: AROM;10 reps Gluteal Sets: AROM;10 reps Hip ABduction/ADduction: AROM;AAROM;10 reps    General Comments General comments (skin integrity, edema, etc.): pt has tools for back protection from OT session today, but did not use anything to manage BR      Pertinent Vitals/Pain Pain Assessment:  Faces Faces Pain Scale: Hurts little more Pain Location: L side Pain Descriptors / Indicators: Burning;Cramping;Grimacing;Guarding Pain Intervention(s): Limited activity within patient's tolerance;Monitored during session;Ice applied;Repositioned    Home Living                      Prior Function            PT Goals (current  goals can now be found in the care plan section) Acute Rehab PT Goals Patient Stated Goal: get home with significant other Progress towards PT goals: Progressing toward goals    Frequency    Min 3X/week      PT Plan Current plan remains appropriate    Co-evaluation              AM-PAC PT "6 Clicks" Mobility   Outcome Measure  Help needed turning from your back to your side while in a flat bed without using bedrails?: A Little Help needed moving from lying on your back to sitting on the side of a flat bed without using bedrails?: A Lot Help needed moving to and from a bed to a chair (including a wheelchair)?: A Lot Help needed standing up from a chair using your arms (e.g., wheelchair or bedside chair)?: A Lot Help needed to walk in hospital room?: A Lot Help needed climbing 3-5 steps with a railing? : A Lot 6 Click Score: 13    End of Session Equipment Utilized During Treatment: Gait belt Activity Tolerance: Patient tolerated treatment well;Other (comment) Patient left: with call bell/phone within reach;in chair;with chair alarm set Nurse Communication: Mobility status PT Visit Diagnosis: Pain;Other abnormalities of gait and mobility (R26.89) Pain - Right/Left: Left Pain - part of body:  (ribcage)     Time: 2878-6767 PT Time Calculation (min) (ACUTE ONLY): 26 min  Charges:  $Gait Training: 8-22 mins $Therapeutic Exercise: 8-22 mins                Ivar Drape 01/24/2021, 5:03 PM Samul Dada, PT MS Acute Rehab Dept. Number: [facility] [id number] and [facility] [phone]

## 2021-01-24 NOTE — Progress Notes (Signed)
Subjective: The patient is alert and pleasant.  She looks and feels much better.  Objective: Vital signs in last 24 hours: Temp:  [98.1 F (36.7 C)-98.4 F (36.9 C)] 98.1 F (36.7 C) (04/11 0726) Pulse Rate:  [71-103] 81 (04/11 0726) Resp:  [16-18] 17 (04/11 0726) BP: (103-132)/(75-78) 132/77 (04/11 0726) SpO2:  [94 %] 94 % (04/11 0726) Estimated body mass index is 57.48 kg/m as calculated from the following:   Height as of this encounter: 4\' 10"  (1.473 m).   Weight as of this encounter: 124.7 kg.   Intake/Output from previous day: 04/10 0701 - 04/11 0700 In: -  Out: 2100 [Urine:2100] Intake/Output this shift: No intake/output data recorded.  Physical exam the patient is alert and oriented.  Her lower extremity strength and sensation is normal.  Lab Results: No results for input(s): WBC, HGB, HCT, PLT in the last 72 hours. BMET Recent Labs    01/23/21 0323 01/24/21 0327  NA 138 138  K 3.8 3.5  CL 97* 99  CO2 33* 29  GLUCOSE 119* 121*  BUN 13 16  CREATININE 0.83 0.87  CALCIUM 9.1 9.2    Studies/Results: No results found.  Assessment/Plan: Thoracic discitis, osteomyelitis, pain, stenosis: The patient seems to be doing well with empiric antibiotics.  She has a PICC line in place.  The plan is to send her home tomorrow.  I have asked her to follow-up with me in the office in a couple weeks and call if she has any problems.  LOS: 7 days     03/26/21 01/24/2021, 7:43 AM

## 2021-01-24 NOTE — TOC Transition Note (Signed)
Transition of Care [facility]) - CM/SW Discharge Note   Patient Details  Name: Stacey Kaiser MRN: [id number] Date of Birth: Jun 20, 1971  Transition of Care [facility]) CM/SW Contact:  Epifanio Lesches, RN Phone Number: 01/24/2021, 5:20 PM   Clinical Narrative:    Admitted with diskitis/osteomyeltis and compression fracture of T8.   Patient will DC to: home Anticipated DC date: 01/24/2021 Family notified: husband Transport by: car  Per MD patient ready for DC today. RN, patient, patient's husband, and home health agencies ( [facility]) notified of DC. IV home infusion teaching completed by Marchelle Folks with pt and husband @ bedside. Home health RN with [facility] to begin on 4/12. Pt without Rx MED concerns. DME rolling walker and 3 in 1/BSC , referral made with [facility]. Equipment will be delivered to bedside prior to d/c. Post hospital f/u noted on AVS.  Husband to provide transportation to home.  RNCM will sign off for now as intervention is no longer needed. Please consult [place] again if new needs arise.  Final next level of care: Home w Home Health Services Barriers to Discharge: No Barriers Identified   Patient Goals and CMS Choice     Choice offered to / list presented to : Patient  Discharge Placement                       Discharge Plan and Services                DME Arranged: Dan Humphreys rolling,3-N-1 DME Agency: [facility] Date DME Agency Contacted: 01/24/21 Time DME Agency Contacted: [phone] Representative spoke with at DME Agency: Francesco Sor Arranged: RN [facility] Agency: Other - See comment Date HH Agency Contacted: 01/24/21 Time HH Agency Contacted: [phone]    Social Determinants of Health (SDOH) Interventions     Readmission Risk Interventions No flowsheet data found.

## 2021-01-24 NOTE — Progress Notes (Signed)
Occupational Therapy Treatment Patient Details Name: Stacey Kaiser MRN: [id number] DOB: 1970/10/28 Today's Date: 01/24/2021    History of present illness Pt adm 4/3 with upper back pain. 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 Neurosurgery and ID consulted. Pt started on IV antibiotics. Biopsies performed in radiology on 4/5. PMH - morbid obesity.   OT comments  Pt making steady progress towards OT goals this session. Pt continues to be limited by increased pain, back precautions and impaired balance. Full education and demonstration provided on all LB AE for bathing and dressing with pt verbalizing understanding. Demonstrated use of 3n1 as shower seat however pt currently completing only stand pivot transfers with MIN A +1 and RW, pt will definitely need 3n1 for home as pt plans to stay in bed mostly ( pt and her husband just moved into their home and have no furniture). Pt able to state 3/3 back precautions by end of session. Pt would greatly benefit from [facility] to facilitate functional independence and assist pt in returning to PLOF, will follow acutely per POC.    Follow Up Recommendations  Home health OT;Supervision/Assistance - 24 hour;Other (comment) ((if pain decreases to a level where she can mobilize to a [facility]))    Equipment Recommendations  3 in 1 bedside commode;Other (comment) (bari)    Recommendations for Other Services      Precautions / Restrictions Precautions Precautions: Fall;Back Precaution Booklet Issued: No Precaution Comments: pt able to state 3/3 precautions by end of session Restrictions Weight Bearing Restrictions: No       Mobility Bed Mobility Overal bed mobility: Needs Assistance Bed Mobility: Rolling;Sidelying to Sit Rolling: Min guard Sidelying to sit: Mod assist       General bed mobility comments: MOD A to elevate trunk into sitting    Transfers Overall transfer level: Needs assistance Equipment used:  Rolling walker (2 wheeled) Transfers: Sit to/from [employer] Sit to Stand: Min assist Stand pivot transfers: Min assist       General transfer comment: MIN A to rise from EOB for initial steadying assist, MIN A +1 to pivot to R side to recliner with cues for safety and to sequence steps    Balance Overall balance assessment: Mild deficits observed, not formally tested                                         ADL either performed or assessed with clinical judgement   ADL Overall ADL's : Needs assistance/impaired               Lower Body Bathing Details (indicate cue type and reason): education provided on AE for LB bathing with LH sponge       Lower Body Dressing Details (indicate cue type and reason): full education and demonstration provided on AE for LB Dressing Toilet Transfer: Minimal assistance;RW;Stand-pivot Toilet Transfer Details (indicate cue type and reason): MIN A +1 for simulated toilet transfer from EOB>recliner with RW. pt limited by pain   Toileting - Clothing Manipulation Details (indicate cue type and reason): education provided on completing posterior pericare with AE or utilizing compensatory methods to complete task   Tub/Shower Transfer Details (indicate cue type and reason): pt reports tub shower at home, visually demo'ed use of 3n1 as shower seat Functional mobility during ADLs: Minimal assistance;Rolling walker General ADL Comments: pt continues to present with decreased  activity tolerance, increased pain,back precautions and impaired balance,     Vision       Perception     Praxis      Cognition Arousal/Alertness: Awake/alert Behavior During Therapy: WFL for tasks assessed/performed;Anxious Overall Cognitive Status: Within Functional Limits for tasks assessed                                          Exercises     Shoulder Instructions       General Comments issued pt LB AE for  bathing and dressing and lengthy discussion on shower seats as pt really wants to wash her hair, pts husband present during session stating he can assist her at home as needed    Pertinent Vitals/ Pain       Pain Assessment: Faces Faces Pain Scale: Hurts little more Pain Location: L side Pain Descriptors / Indicators: Burning;Aching;Stabbing Pain Intervention(s): Limited activity within patient's tolerance;Monitored during session;Repositioned  Home Living                                          Prior Functioning/Environment              Frequency  Min 2X/week        Progress Toward Goals  OT Goals(current goals can now be found in the care plan section)  Progress towards OT goals: Progressing toward goals  Acute Rehab OT Goals Patient Stated Goal: to go home and wash her hair OT Goal Formulation: With patient/family Time For Goal Achievement: 02/04/21 Potential to Achieve Goals: Good  Plan Discharge plan remains appropriate;Frequency remains appropriate    Co-evaluation                 AM-PAC OT "6 Clicks" Daily Activity     Outcome Measure   Help from another person eating meals?: None Help from another person taking care of personal grooming?: A Little Help from another person toileting, which includes using toliet, bedpan, or urinal?: A Lot Help from another person bathing (including washing, rinsing, drying)?: A Lot Help from another person to put on and taking off regular upper body clothing?: A Little Help from another person to put on and taking off regular lower body clothing?: A Lot 6 Click Score: 16    End of Session Equipment Utilized During Treatment: Gait belt;Rolling walker  OT Visit Diagnosis: Other abnormalities of gait and mobility (R26.89);Pain Pain - Right/Left: Left Pain - part of body:  (L side)   Activity Tolerance Patient tolerated treatment well   Patient Left in chair;with call bell/phone within reach;with  family/visitor present   Nurse Communication Mobility status        Time: 0998-3382 OT Time Calculation (min): 33 min  Charges: OT General Charges $OT Visit: 1 Visit OT Treatments $Self Care/Home Management : 23-37 mins  Lenor Derrick., COTA/L Acute Rehabilitation Services [phone]    Barron Schmid 01/24/2021, 4:28 PM

## 2021-01-24 NOTE — Discharge Summary (Addendum)
Physician Discharge Summary  Stacey Kaiser IRJ:[id number] DOB: 01/29/1971 DOA: 01/16/2021  PCP: Patient, No Pcp Per (Inactive)  Admit date: 01/16/2021 Discharge date: 01/24/2021  Admitted From: Home Disposition:  Home  Discharge Condition:Stable CODE STATUS:FULL Diet recommendation:Regular   Brief/Interim Summary:  Patient is a 50 year old female with history of morbid obesity with BMI of 57 who presents with severe back pain.  She was seen and treated with different medications for back pain as an outpatient but it did not resolve so she presented to the emergency department.  CT imagings showed discitis and compression fracture at T8.  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  ID/IR/neurosurgery consulted.  Underwent disc biopsy by IR on 01/18/21. Started on IV antibiotics.  Hospital course remarkable for severe back pain with radiation. PT/OT evaluation recommending HH.   Back pain is much better today.  She is medically stable for discharge to home today, she will continue IV antibiotics at home.  Following problems were addressed during her hospitalization:  Discitis/osteomyelitis: Imagings showed discitis/osteomyelitis on T8-T9 with compression fracture of T9 and also presence of epidural phlegmon.  ID/IR/neurosurgery consulted.  Underwent  T9 core biopsies  on 01/18/21 under general anesthesia .  Blood Cultures have been sent ,NGTD.  Surgical pathology did not show evidence of acute osteomyelitis. Has been started on IV antibiotics now.  ID recommended to continue vancomycin and ceftriaxone for 6 weeks and follow-up as an outpatient. PT recommended home health.  Back pain is much better now.  Abdominal distention: Distended abdomen but denies any abdominal pain.  Abdominal imaging she did not show any intra-abdominal or intrapelvic pathology. Continue bowel regimen for constipation.  Morbid obesity: BMI  57.48.  Weight loss, healthy diet encouraged  Elevated TSH: Mild elevated TSH of 7.7.  Normal free T4 and T3.  Will not start on Synthyroid.  Uninsured: TOC consulted  Hypokalemia: Potassium supplemented   Respiratory insufficiency: .  No history of respiratory disease.  Chest x-ray some cardiomegaly with congestion and small bilateral pleural effusion.   Lungs are clear on auscultation.  BNP  elevated.  Echocardiogram showed normal left ventricular function with ejection fraction of 55 to 30%, normal diastolic parameters, moderately elevated pulmonary artery systolic pressure.  Her respiratory insufficiency could be contributed by undiagnosed OSA/obesity hypoventilation syndrome versus pulmonary artery hypertension.  She needs to have a sleep study as an outpatient.  Currently she is saturating fine on room air after aggressive diuresis.  Continue Lasix 20 mg daily at home.  Morbid obesity: BMI 57.4    Discharge Diagnoses:  Principal Problem:   Discitis Active Problems:   Morbid obesity with BMI of 50.0-59.9, adult [facility])    Discharge Instructions  Discharge Instructions    [facility] pharmacist to adjust dose for Vancomycin, Aminoglycosides and other anti-infective therapies as requested by physician.   Complete by: As directed    [facility] pharmacist to adjust dose for Vancomycin, Aminoglycosides and other anti-infective therapies as requested by physician.   Complete by: As directed    [facility] to provide Cath Flo 57m   Complete by: As directed    Administer for PICC line occlusion and as ordered by physician for other access device issues.   [facility] to provide Cath Flo 276m  Complete by: As directed    Administer for PICC line occlusion and as ordered by physician for other access device issues.   Anaphylaxis Kit: Provided  to treat any anaphylactic reaction to the medication being provided to the patient if First Dose  or when requested by physician   Complete by: As directed    Epinephrine 48m/ml vial / amp: Administer 0.357m(0.68m74msubcutaneously once for moderate to severe anaphylaxis, nurse to call physician and pharmacy when reaction occurs and call 911 if needed for immediate care   Diphenhydramine 11m27m IV vial: Administer 25-11mg37mIM PRN for first dose reaction, rash, itching, mild reaction, nurse to call physician and pharmacy when reaction occurs   Sodium Chloride 0.9% NS 500ml 46mAdminister if needed for hypovolemic blood pressure drop or as ordered by physician after call to physician with anaphylactic reaction   Anaphylaxis Kit: Provided to treat any anaphylactic reaction to the medication being provided to the patient if First Dose or when requested by physician   Complete by: As directed    Epinephrine 1mg/ml54mal / amp: Administer 0.68mg (0.68m) sub74maneously once for moderate to severe anaphylaxis, nurse to call physician and pharmacy when reaction occurs and call 911 if needed for immediate care   Diphenhydramine 11mg/ml I468mal: Administer 25-11mg IV/IM60m for first dose reaction, rash, itching, mild reaction, nurse to call physician and pharmacy when reaction occurs   Sodium Chloride 0.9% NS 500ml IV: Ad57mster if needed for hypovolemic blood pressure drop or as ordered by physician after call to physician with anaphylactic reaction   Change dressing on IV access line weekly and PRN   Complete by: As directed    Change dressing on IV access line weekly and PRN   Complete by: As directed    Diet general   Complete by: As directed    Discharge instructions   Complete by: As directed    1)Please take prescribed medication as instructed 2)[facility] for the follow-up appointment. 3)Follow up with infectious disease on 02/09/2021 at 3 PM.  Name and number of the provider has been attached   Flush IV access with Sodium Chloride 0.9%  and Heparin 10 units/ml or 100 units/ml   Complete by: As directed    Flush IV access with Sodium Chloride 0.9% and Heparin 10 units/ml or 100 units/ml   Complete by: As directed    Home infusion instructions - [facility]   Complete by: As directed    Instructions: Flush IV access with Sodium Chloride 0.9% and Heparin 10units/ml or 100units/ml   Change dressing on IV access line: Weekly and PRN   Instructions Cath Flo 2mg: Adminis24m for PICC Line occlusion and as ordered by physician for other access device   [facility] pharmacist to adjust dose for: Vancomycin, Aminoglycosides and other anti-infective therapies as requested by physician   Home infusion instructions - [facility]   Complete by: As directed    Instructions: Flush IV access with Sodium Chloride 0.9% and Heparin 10units/ml or 100units/ml   Change dressing on IV access line: Weekly and PRN   Instructions Cath Flo 2mg: Administ27mfor PICC Line occlusion and as ordered by physician for other access device   [facility] pharmacist to adjust dose for: Vancomycin, Aminoglycosides and other anti-infective therapies as requested by physician   Increase activity slowly   Complete by: As directed    Method of administration may be changed at the discretion of home infusion pharmacist based upon assessment of the patient and/or caregiver's ability to self-administer the medication ordered   Complete by: As directed  Method of administration may be changed at the discretion of home infusion pharmacist based upon assessment of the patient and/or caregiver's ability to self-administer the medication ordered   Complete by: As directed    No wound care   Complete by: As directed      Allergies as of 01/24/2021      Reactions   Okra Hives   Other    Bees and wasps      Medication List    STOP taking these medications   tiZANidine 4 MG tablet Commonly known as: ZANAFLEX     TAKE  these medications   albuterol 108 (90 Base) MCG/ACT inhaler Commonly known as: VENTOLIN HFA Inhale 2 puffs into the lungs every 4 (four) hours as needed for wheezing or shortness of breath.   cefTRIAXone  IVPB Commonly known as: ROCEPHIN Inject 2 g into the vein daily. Indication:  discitis First Dose: Yes Last Day of Therapy:  03/02/2021 Labs - Once weekly:  CBC/D and BMP, Labs - Every other week:  ESR and CRP Method of administration: IV Push Method of administration may be changed at the discretion of home infusion pharmacist based upon assessment of the patient and/or caregiver's ability to self-administer the medication ordered.   CO Q 10 PO Take 1 tablet by mouth daily. gummy   furosemide 20 MG tablet Commonly known as: Lasix Take 1 tablet (20 mg total) by mouth daily.   gabapentin 300 MG capsule Commonly known as: NEURONTIN Take 2 capsules (600 mg total) by mouth 3 (three) times daily. What changed:   medication strength  how much to take   HYDROcodone-acetaminophen 5-325 MG tablet Commonly known as: NORCO/VICODIN Take 2 tablets by mouth every 4 (four) hours as needed for moderate pain.   ibuprofen 200 MG tablet Commonly known as: ADVIL Take 800 mg by mouth every 6 (six) hours as needed for mild pain or headache.   lidocaine 5 % Commonly known as: LIDODERM Place 1 patch onto the skin daily. Remove & Discard patch within 12 hours or as directed by MD   methocarbamol 500 MG tablet Commonly known as: Robaxin Take 1 tablet (500 mg total) by mouth every 6 (six) hours as needed for muscle spasms.   OMEGA-3 FISH OIL PO Take 1 capsule by mouth daily.   ONE-A-DAY WOMENS PETITES PO Take 1 tablet by mouth daily.   HAIR SKIN AND NAILS FORMULA PO Take 1 tablet by mouth daily.   OVER THE COUNTER MEDICATION Take 1 tablet by mouth daily. Power c gummy   polyethylene glycol 17 g packet Commonly known as: MIRALAX / GLYCOLAX Take 17 g by mouth daily.   Potassium  Chloride ER 20 MEQ Tbcr Take 20 mEq by mouth daily.   vancomycin  IVPB Inject 1,750 mg into the vein daily. Indication:  discitis First Dose: Yes Last Day of Therapy:  03/02/2021 Labs - Sunday/Monday:  CBC/D, BMP, and vancomycin trough. Labs - Thursday:  BMP and vancomycin trough Labs - Every other week:  ESR and CRP Method of administration:Elastomeric Method of administration may be changed at the discretion of the patient and/or caregiver's ability to self-administer the medication ordered.   VITAMIN B-12 PO Take 1 tablet by mouth daily. gummy            Discharge Care Instructions  (From admission, onward)         Start     Ordered   01/24/21 0000  Change dressing on IV access line weekly and  PRN  (Home infusion instructions - [facility] )        01/24/21 1020   01/24/21 0000  Change dressing on IV access line weekly and PRN  (Home infusion instructions - [facility] )        01/24/21 1020          Follow-up Information    [facility] Follow up on 02/24/2021.   Why: Post hosital follow up scheduled for 02/24/2021 at 1430 with Freeman Caldron PA Contact information: [place] [phone] May, FNP Follow up.   Specialties: Family Medicine, Infectious Diseases Why: 02/09/21 at 3pm. If unable to make this appointment please call our office to reschedule.  Contact information: [place] [address] [phone]        [facility] Follow up.   Why: [facility] will provide home health RN services, start of care within 24 hours post dischare Contact information: Address: [address] [place] [place], [place] [address] Phone: [phone]             Allergies  Allergen Reactions  . Okra Hives  . Other     Bees and wasps    Consultations: Neurosurgery,IR,ID  Procedures/Studies: CT ABDOMEN PELVIS WO  CONTRAST  Result Date: 01/17/2021 CLINICAL DATA:  50 year old female with abdominal distension EXAM: CT ABDOMEN AND PELVIS WITHOUT CONTRAST TECHNIQUE: Multidetector CT imaging of the abdomen and pelvis was performed following the standard protocol without IV contrast. COMPARISON:  CT abdomen/pelvis yesterday 01/16/2021; MRI thoracic spine earlier today FINDINGS: Lower chest: Trace bilateral pleural effusions with associated lower lobe atelectasis. Bronchial wall thickening present in both lower lobes. Stable heart size. No pericardial effusion. Unremarkable distal thoracic esophagus. Hepatobiliary: Normal hepatic contour and morphology. No discrete hepatic lesion. High attenuation material present within the gallbladder consistent with vicarious excretion of contrast. No intra or extrahepatic biliary ductal dilatation. Pancreas: Unremarkable. No pancreatic ductal dilatation or surrounding inflammatory changes. Spleen: Normal in size without focal abnormality. Adrenals/Urinary Tract: Normal adrenal glands. Normal renal size and morphology without exophytic mass, hydronephrosis or nephrolithiasis. Excreted contrast material present within the calices and urinary bladder. The ureters are unremarkable. No evidence of bladder mass, stone or other filling defect. Stomach/Bowel: No evidence of obstruction or focal bowel wall thickening. Normal appendix in the right lower quadrant. The terminal ileum is unremarkable. Vascular/Lymphatic: Limited evaluation in the absence of intravenous contrast. No significant atherosclerotic plaque, or aneurysm. No suspicious lymphadenopathy. Reproductive: Unremarkable uterus and right adnexa. Low-attenuation cystic structures arising from the left ovary demonstrate no change compared to yesterday. In a reproductive age female, findings remain consistent with benign ovarian cysts. Other: No abdominal wall hernia or abnormality. No abdominopelvic ascites. Musculoskeletal: Destructive  changes at T8-T9 with erosion of the inferior endplate of T8 in the superior endplate of T9. Associated focal exaggerated thoracic kyphosis. Paraspinal phlegmonous changes are also again noted. There has been no significant interval progression in the short interval since prior imaging. IMPRESSION: 1. Imaging findings consistent with osteomyelitis/discitis at T8-T9 again demonstrated. 2. No acute abnormality or interval change in the abdomen or pelvis. Electronically Signed   By: Jacqulynn Cadet M.D.   On: 01/17/2021 05:22   MR THORACIC SPINE W WO CONTRAST  Result Date: 01/17/2021 CLINICAL DATA:  Initial evaluation for compression fracture. EXAM: MRI THORACIC WITHOUT AND WITH CONTRAST TECHNIQUE: Multiplanar and multiecho pulse sequences  of the thoracic spine were obtained without and with intravenous contrast. CONTRAST:  40m GADAVIST GADOBUTROL 1 MMOL/ML IV SOLN COMPARISON:  Prior CT from 01/16/2021 FINDINGS: Alignment: Slight exaggeration of the normal thoracic kyphosis, apex at T8-9. No listhesis. Vertebrae: Abnormal edema and enhancement with loss of disc space height and endplate irregularity seen about the T8-9 interspace, consistent with acute osteomyelitis discitis. Associated height loss of up to approximately 75% at the adjacent T9 vertebral body, with more moderate approximate 50% height loss at T8. Edema and enhancement extends into the T8-9 facets bilaterally, consistent with associated septic arthritis. Evidence for associated epidural phlegmon and enhancement within both the ventral and dorsal epidural space at the level of T8-9 (series 10, image 8). No frank epidural abscess visible on this motion degraded exam. Additionally, mild marrow edema and enhancement with disc space height loss noted about the adjacent T7-8 interspace, also concerning for acute osteomyelitis discitis as well. This is more mild in appearance as compared to the adjacent T8-9 interspace. Associated paraspinous edema and  enhancement adjacent to the infected T8-9 interspace without visible soft tissue abscess. No other sites of acute infection within the thoracic spine. Vertebral body height otherwise maintained. Diffusely decreased T1 signal intensity seen throughout the visualized bone marrow, nonspecific, but most commonly related to anemia, smoking or obesity. No worrisome osseous lesions. Cord: No convincing cord signal abnormality seen on this motion degraded exam. Paraspinal and other soft tissues: Paraspinous edema and enhancement adjacent to the T8-9 interspace, consistent with infection. No discrete soft tissue collections or abscesses. Small layering bilateral pleural effusions noted. Disc levels: T8-9: Findings consistent with osteomyelitis discitis. Associated compression deformity at the superior endplate of T9 with up to 50-60% height loss and 4 mm bony retropulsion. Epidural phlegmon and enhancement within the ventral and dorsal epidural space without definite frank abscess formation. Resultant moderate spinal stenosis with mild cord flattening. No definite cord signal changes on this motion degraded exam. Moderate right worse than left foraminal narrowing. Additional fairly mild degenerative changes for age noted elsewhere within the thoracic spine. No other significant canal or foraminal stenosis. No other neural impingement. IMPRESSION: 1. Findings consistent with acute osteomyelitis discitis and septic arthritis at T8-9. Associated compression fracture of T9 with up to 75% anterior height loss and 4 mm bony retropulsion, with 50% height loss at T8. Superimposed epidural phlegmon at this level with resultant moderate spinal stenosis and mild cord flattening, but no definite cord signal changes on this motion degraded exam. 2. Mild edema and enhancement about the adjacent T7-8 interspace, also consistent with acute osteomyelitis discitis. 3. Associated paraspinous edema and enhancement adjacent to the T8-9  interspace without discrete soft tissue abscess. 4. Small layering bilateral pleural effusions. Electronically Signed   By: BJeannine BogaM.D.   On: 01/17/2021 01:31   CT Abdomen Pelvis W Contrast  Result Date: 01/16/2021 CLINICAL DATA:  Upper back pain that radiates. EXAM: CT ANGIOGRAPHY CHEST CT ABDOMEN AND PELVIS WITH CONTRAST TECHNIQUE: Multidetector CT imaging of the chest was performed using the standard protocol during bolus administration of intravenous contrast. Multiplanar CT image reconstructions and MIPs were obtained to evaluate the vascular anatomy. Multidetector CT imaging of the abdomen and pelvis was performed using the standard protocol during bolus administration of intravenous contrast. CONTRAST:  1029mOMNIPAQUE IOHEXOL 350 MG/ML SOLN COMPARISON:  None. FINDINGS: CTA CHEST FINDINGS Cardiovascular: Noncontrast imaging demonstrates no intramural hematoma within the thoracic aorta. Contrast enhanced imaging demonstrates no aortic dissection or aneurysm. Great vessels are normal.  No pericardial fluid. The main pulmonary artery is prominent at 40 mm. No filling defects within the pulmonary arteries identified. Mediastinum/Nodes: No axillary or supraclavicular adenopathy. No mediastinal or hilar adenopathy. No pericardial fluid. Esophagus normal. Lungs/Pleura: low lung volumes. Small bilateral pleural effusions. Bibasilar passive atelectasis. There is interlobular septal thickening consistent with interstitial edema. No pneumothorax. No pneumonia or infarction. Musculoskeletal: There is a compression deformity superior endplate of the T9 vertebral body with focal kyphosis. There is loss of cortical definition at this level. Additionally there is soft tissue thickening in the paravertebral space at this level (image 88/3 axial and sagittal image 96/series 7). Review of the MIP images confirms the above findings. CT ABDOMEN and PELVIS FINDINGS Hepatobiliary: No focal hepatic lesion. No biliary  duct dilatation. Common bile duct is normal. Pancreas: Pancreas is normal. No ductal dilatation. No pancreatic inflammation. Spleen: Normal spleen Adrenals/urinary tract: Adrenal glands and kidneys are normal. The ureters and bladder normal. Stomach/Bowel: Stomach, small-bowel and cecum are normal. The appendix is not identified but there is no pericecal inflammation to suggest appendicitis. The colon and rectosigmoid colon are normal. Vascular/Lymphatic: Abdominal aorta is normal caliber. No periportal or retroperitoneal adenopathy. No pelvic adenopathy. Reproductive: Uterus and ovaries normal. Other: No free fluid. Musculoskeletal: No aggressive osseous lesion. Review of the MIP images confirms the above findings. IMPRESSION: Chest Impression: 1. Loss of superior endplate height at T9 with associated inflammatory process in the perivertebral soft tissue. Findings concerning for discitis - osteomyelitis. RECOMMEND MRI of the thoracic spine without with contrast. 2. No evidence of aortic dissection or aneurysm. 3. Prominent main pulmonary artery suggest pulmonary hypertension. 4. Low lung volumes Abdomen / Pelvis Impression: 1. No acute findings in the abdomen pelvis. Findings conveyed toJOSHUA HONG on 01/16/2021  at19:02. Electronically Signed   By: Suzy Bouchard M.D.   On: 01/16/2021 19:02   DG CHEST PORT 1 VIEW  Result Date: 01/19/2021 CLINICAL DATA:  Chest pain. EXAM: PORTABLE CHEST 1 VIEW COMPARISON:  Chest x-ray 01/17/2021.  CT chest 01/16/2021. FINDINGS: Cardiomegaly with pulmonary vascular prominence again noted. Mild left base atelectasis. No focal infiltrate. No pleural effusion or pneumothorax. IMPRESSION: Cardiomegaly with pulmonary vascular prominence noted. Mild left base atelectasis. No focal infiltrate. Electronically Signed   By: Marcello Moores  Register   On: 01/19/2021 13:21   DG Chest Port 1 View  Result Date: 01/17/2021 CLINICAL DATA:  Pain EXAM: PORTABLE CHEST 1 VIEW COMPARISON:  01/16/2021  FINDINGS: The heart size is enlarged. There are areas of atelectasis at the lung bases. There is vascular congestion. There are probable small bilateral pleural effusions. There is no pneumothorax. IMPRESSION: Cardiomegaly with vascular congestion and probable small bilateral pleural effusions. Electronically Signed   By: Constance Holster M.D.   On: 01/17/2021 03:28   CT Angio Chest Aorta w/CM &/OR wo/CM  Result Date: 01/16/2021 CLINICAL DATA:  Upper back pain that radiates. EXAM: CT ANGIOGRAPHY CHEST CT ABDOMEN AND PELVIS WITH CONTRAST TECHNIQUE: Multidetector CT imaging of the chest was performed using the standard protocol during bolus administration of intravenous contrast. Multiplanar CT image reconstructions and MIPs were obtained to evaluate the vascular anatomy. Multidetector CT imaging of the abdomen and pelvis was performed using the standard protocol during bolus administration of intravenous contrast. CONTRAST:  114m OMNIPAQUE IOHEXOL 350 MG/ML SOLN COMPARISON:  None. FINDINGS: CTA CHEST FINDINGS Cardiovascular: Noncontrast imaging demonstrates no intramural hematoma within the thoracic aorta. Contrast enhanced imaging demonstrates no aortic dissection or aneurysm. Great vessels are normal. No pericardial fluid. The main  pulmonary artery is prominent at 40 mm. No filling defects within the pulmonary arteries identified. Mediastinum/Nodes: No axillary or supraclavicular adenopathy. No mediastinal or hilar adenopathy. No pericardial fluid. Esophagus normal. Lungs/Pleura: low lung volumes. Small bilateral pleural effusions. Bibasilar passive atelectasis. There is interlobular septal thickening consistent with interstitial edema. No pneumothorax. No pneumonia or infarction. Musculoskeletal: There is a compression deformity superior endplate of the T9 vertebral body with focal kyphosis. There is loss of cortical definition at this level. Additionally there is soft tissue thickening in the paravertebral  space at this level (image 88/3 axial and sagittal image 96/series 7). Review of the MIP images confirms the above findings. CT ABDOMEN and PELVIS FINDINGS Hepatobiliary: No focal hepatic lesion. No biliary duct dilatation. Common bile duct is normal. Pancreas: Pancreas is normal. No ductal dilatation. No pancreatic inflammation. Spleen: Normal spleen Adrenals/urinary tract: Adrenal glands and kidneys are normal. The ureters and bladder normal. Stomach/Bowel: Stomach, small-bowel and cecum are normal. The appendix is not identified but there is no pericecal inflammation to suggest appendicitis. The colon and rectosigmoid colon are normal. Vascular/Lymphatic: Abdominal aorta is normal caliber. No periportal or retroperitoneal adenopathy. No pelvic adenopathy. Reproductive: Uterus and ovaries normal. Other: No free fluid. Musculoskeletal: No aggressive osseous lesion. Review of the MIP images confirms the above findings. IMPRESSION: Chest Impression: 1. Loss of superior endplate height at T9 with associated inflammatory process in the perivertebral soft tissue. Findings concerning for discitis - osteomyelitis. RECOMMEND MRI of the thoracic spine without with contrast. 2. No evidence of aortic dissection or aneurysm. 3. Prominent main pulmonary artery suggest pulmonary hypertension. 4. Low lung volumes Abdomen / Pelvis Impression: 1. No acute findings in the abdomen pelvis. Findings conveyed toJOSHUA HONG on 01/16/2021  at19:02. Electronically Signed   By: Suzy Bouchard M.D.   On: 01/16/2021 19:02   ECHOCARDIOGRAM COMPLETE  Result Date: 01/19/2021    ECHOCARDIOGRAM REPORT   Patient Name:   [facility] MORRIS[facility]CAMPBELL Date of Exam: 01/19/2021 Medical Rec #:  [id number]               Height:       58.0 in Accession #:    [id number]              Weight:       275.0 lb Date of Birth:  1971/06/11               BSA:          2.085 m Patient Age:    72 years                BP:           146/68 mmHg Patient Gender: F                        HR:           104 bpm. Exam Location:  Inpatient Procedure: 2D Echo Indications:    CHF  History:        Patient has no prior history of Echocardiogram examinations.  Sonographer:    Mikki Santee RDCS (AE) Referring Phys: [id number] Niti Leisure IMPRESSIONS  1. Left ventricular ejection fraction, by estimation, is 55 to 60%. The left ventricle has normal function. The left ventricle has no regional wall motion abnormalities. Left ventricular diastolic parameters were normal. The lateral e' velocity is 10 cm/s.  2. Right ventricular systolic function is normal. The right ventricular size is normal. There is moderately elevated  pulmonary artery systolic pressure. The estimated right ventricular systolic pressure is 53.6 mmHg.  3. The mitral valve is abnormal. Mild mitral valve regurgitation.  4. The aortic valve is tricuspid. Aortic valve regurgitation is not visualized.  5. The inferior vena cava is normal in size with greater than 50% respiratory variability, suggesting right atrial pressure of 3 mmHg. Comparison(s): No prior Echocardiogram. FINDINGS  Left Ventricle: Left ventricular ejection fraction, by estimation, is 55 to 60%. The left ventricle has normal function. The left ventricle has no regional wall motion abnormalities. The left ventricular internal cavity size was normal in size. There is  no left ventricular hypertrophy. Left ventricular diastolic parameters were normal. Indeterminate filling pressures. The E/e' is 10. Right Ventricle: The right ventricular size is normal. No increase in right ventricular wall thickness. Right ventricular systolic function is normal. There is moderately elevated pulmonary artery systolic pressure. The tricuspid regurgitant velocity is 3.41 m/s, and with an assumed right atrial pressure of 3 mmHg, the estimated right ventricular systolic pressure is 64.4 mmHg. Left Atrium: Left atrial size was normal in size. Right Atrium: Right atrial size was normal in  size. Pericardium: There is no evidence of pericardial effusion. Mitral Valve: The mitral valve is abnormal. There is mild thickening of the mitral valve leaflet(s). Mild mitral valve regurgitation. Tricuspid Valve: The tricuspid valve is grossly normal. Tricuspid valve regurgitation is mild. Aortic Valve: The aortic valve is tricuspid. Aortic valve regurgitation is not visualized. Pulmonic Valve: The pulmonic valve was normal in structure. Pulmonic valve regurgitation is not visualized. Aorta: The aortic root and ascending aorta are structurally normal, with no evidence of dilitation. Venous: The inferior vena cava is normal in size with greater than 50% respiratory variability, suggesting right atrial pressure of 3 mmHg. IAS/Shunts: No atrial level shunt detected by color flow Doppler.  LEFT VENTRICLE PLAX 2D LVIDd:         3.80 cm  Diastology LVIDs:         2.70 cm  LV e' medial:    8.59 cm/s LV PW:         1.00 cm  LV E/e' medial:  15.3 LV IVS:        0.90 cm  LV e' lateral:   10.40 cm/s LVOT diam:     1.70 cm  LV E/e' lateral: 12.6 LV SV:         72 LV SV Index:   35 LVOT Area:     2.27 cm  RIGHT VENTRICLE RV S prime:     17.30 cm/s TAPSE (M-mode): 2.0 cm LEFT ATRIUM             Index       RIGHT ATRIUM           Index LA diam:        3.70 cm 1.77 cm/m  RA Area:     18.10 cm LA Vol (A2C):   45.6 ml 21.87 ml/m RA Volume:   52.40 ml  25.13 ml/m LA Vol (A4C):   46.9 ml 22.49 ml/m LA Biplane Vol: 47.2 ml 22.64 ml/m  AORTIC VALVE LVOT Vmax:   162.00 cm/s LVOT Vmean:  107.000 cm/s LVOT VTI:    0.318 m  AORTA Ao Root diam: 2.80 cm MITRAL VALVE                TRICUSPID VALVE MV Area (PHT): 4.29 cm     TR Peak grad:   46.5 mmHg MV Decel Time: 177 msec  TR Vmax:        341.00 cm/s MV E velocity: 131.00 cm/s MV A velocity: 90.70 cm/s   SHUNTS MV E/A ratio:  1.44         Systemic VTI:  0.32 m                             Systemic Diam: 1.70 cm Lyman Bishop MD Electronically signed by Lyman Bishop MD Signature  Date/Time: 01/19/2021/5:29:05 PM    Final    [place] EKG SITE RITE  Result Date: 01/17/2021 If Site Rite image not attached, placement could not be confirmed due to current cardiac rhythm.     Subjective:  Patient seen and examined the bedside this morning.  Hemodynamically  stable for discharge.  Discharge Exam: Vitals:   01/24/21 0426 01/24/21 0726  BP: 128/78 132/77  Pulse: 71 81  Resp:  17  Temp: 98.4 F (36.9 C) 98.1 F (36.7 C)  SpO2: 94% 94%   Vitals:   01/23/21 1154 01/23/21 2212 01/24/21 0426 01/24/21 0726  BP: 103/75 121/78 128/78 132/77  Pulse: (!) 103 98 71 81  Resp: 18 16  17   Temp: 98.2 F (36.8 C) 98.4 F (36.9 C) 98.4 F (36.9 C) 98.1 F (36.7 C)  TempSrc: Oral Oral Oral Oral  SpO2: 94% 94% 94% 94%  Weight:      Height:        General: Pt is alert, awake, not in acute distress,obese Cardiovascular: RRR, S1/S2 +, no rubs, no gallops Respiratory: CTA bilaterally, no wheezing, no rhonchi Abdominal: Soft, NT, ND, bowel sounds + Extremities: no edema, no cyanosis    The results of significant diagnostics from this hospitalization (including imaging, microbiology, ancillary and laboratory) are listed below for reference.     Microbiology: Recent Results (from the past 240 hour(s))  Resp Panel by RT-PCR (Flu A&B, Covid) Nasopharyngeal Swab     Status: None   Collection Time: 01/16/21  7:11 PM   Specimen: Nasopharyngeal Swab; Nasopharyngeal(NP) swabs in vial transport medium  Result Value Ref Range Status   SARS Coronavirus 2 by RT PCR NEGATIVE NEGATIVE Final    Comment: (NOTE) SARS-CoV-2 target nucleic acids are NOT DETECTED.  The SARS-CoV-2 RNA is generally detectable in upper respiratory specimens during the acute phase of infection. The lowest concentration of SARS-CoV-2 viral copies this assay can detect is 138 copies/mL. A negative result does not preclude SARS-Cov-2 infection and should not be used as the sole basis for treatment or other  patient management decisions. A negative result may occur with  improper specimen collection/handling, submission of specimen other than nasopharyngeal swab, presence of viral mutation(s) within the areas targeted by this assay, and inadequate number of viral copies(<138 copies/mL). A negative result must be combined with clinical observations, patient history, and epidemiological information. The expected result is Negative.  Fact Sheet for Patients:  EntrepreneurPulse.com.au  Fact Sheet for Healthcare Providers:  IncredibleEmployment.be  This test is no t yet approved or cleared by the [place] FDA and  has been authorized for detection and/or diagnosis of SARS-CoV-2 by FDA under an Emergency Use Authorization (EUA). This EUA will remain  in effect (meaning this test can be used) for the duration of the COVID-19 declaration under Section 564(b)(1) of the Act, 21 U.S.C.section 360bbb-3(b)(1), unless the authorization is terminated  or revoked sooner.       Influenza A by PCR NEGATIVE NEGATIVE Final   Influenza B  by PCR NEGATIVE NEGATIVE Final    Comment: (NOTE) The Xpert Xpress SARS-CoV-2/FLU/RSV plus assay is intended as an aid in the diagnosis of influenza from Nasopharyngeal swab specimens and should not be used as a sole basis for treatment. Nasal washings and aspirates are unacceptable for Xpert Xpress SARS-CoV-2/FLU/RSV testing.  Fact Sheet for Patients: EntrepreneurPulse.com.au  Fact Sheet for Healthcare Providers: IncredibleEmployment.be  This test is not yet approved or cleared by the [place] FDA and has been authorized for detection and/or diagnosis of SARS-CoV-2 by FDA under an Emergency Use Authorization (EUA). This EUA will remain in effect (meaning this test can be used) for the duration of the COVID-19 declaration under Section 564(b)(1) of the Act, 21 U.S.C. section  360bbb-3(b)(1), unless the authorization is terminated or revoked.  Performed at [facility]   Blood culture (routine x 2)     Status: None   Collection Time: 01/16/21 11:37 PM   Specimen: BLOOD  Result Value Ref Range Status   Specimen Description BLOOD RIGHT ANTECUBITAL  Final   Special Requests   Final    BOTTLES DRAWN AEROBIC AND ANAEROBIC Blood Culture results may not be optimal due to an inadequate volume of blood received in culture bottles   Culture   Final    NO GROWTH 5 DAYS Performed at [facility], [place] [address] [place], [place] [address]    Report Status 01/22/2021 FINAL  Final  Blood culture (routine x 2)     Status: None   Collection Time: 01/16/21 11:40 PM   Specimen: BLOOD  Result Value Ref Range Status   Specimen Description BLOOD RIGHT ANTECUBITAL  Final   Special Requests   Final    BOTTLES DRAWN AEROBIC AND ANAEROBIC Blood Culture results may not be optimal due to an inadequate volume of blood received in culture bottles   Culture   Final    NO GROWTH 5 DAYS Performed at [facility], [place] [address], [place], [place] [address]    Report Status 01/22/2021 FINAL  Final     Labs: BNP (last 3 results) Recent Labs    01/18/21 0400 01/20/21 0331  BNP 71.4 165.5*   Basic Metabolic Panel: Recent Labs  Lab 01/20/21 0331 01/21/21 0942 01/22/21 0435 01/23/21 0323 01/24/21 0327  NA 136 137 137 138 138  K 3.4* 3.3* 3.5 3.8 3.5  CL 101 98 96* 97* 99  CO2 29 30 33* 33* 29  GLUCOSE 94 102* 106* 119* 121*  BUN 11 7 9 13 16   CREATININE 0.57 0.65 0.72 0.83 0.87  CALCIUM 8.6* 8.8* 9.0 9.1 9.2   Liver Function Tests: No results for input(s): AST, ALT, ALKPHOS, BILITOT, PROT, ALBUMIN in the last 168 hours. No results for input(s): LIPASE, AMYLASE in the last 168 hours. No results for input(s): AMMONIA in the last 168 hours. CBC: Recent Labs  Lab 01/18/21 0400 01/20/21 0331  WBC 11.6* 11.1*  NEUTROABS  --  8.6*  HGB  11.8* 10.7*  HCT 37.2 33.7*  MCV 95.4 93.9  PLT 372 350   Cardiac Enzymes: No results for input(s): CKTOTAL, CKMB, CKMBINDEX, TROPONINI in the last 168 hours. BNP: Invalid input(s): POCBNP CBG: No results for input(s): GLUCAP in the last 168 hours. D-Dimer No results for input(s): DDIMER in the last 72 hours. Hgb A1c No results for input(s): HGBA1C in the last 72 hours. Lipid Profile No results for input(s): CHOL, HDL, LDLCALC, TRIG, CHOLHDL, LDLDIRECT in the last 72 hours.  Thyroid function studies No results for input(s): TSH, T4TOTAL, T3FREE, THYROIDAB in the last 72 hours.  Invalid input(s): FREET3 Anemia work up No results for input(s): VITAMINB12, FOLATE, FERRITIN, TIBC, IRON, RETICCTPCT in the last 72 hours. Urinalysis No results found for: COLORURINE, APPEARANCEUR, [place], [place], GLUCOSEU, [place], [place], [place], PROTEINUR, UROBILINOGEN, NITRITE, LEUKOCYTESUR Sepsis Labs Invalid input(s): PROCALCITONIN,  WBC,  LACTICIDVEN Microbiology Recent Results (from the past 240 hour(s))  Resp Panel by RT-PCR (Flu A&B, Covid) Nasopharyngeal Swab     Status: None   Collection Time: 01/16/21  7:11 PM   Specimen: Nasopharyngeal Swab; Nasopharyngeal(NP) swabs in vial transport medium  Result Value Ref Range Status   SARS Coronavirus 2 by RT PCR NEGATIVE NEGATIVE Final    Comment: (NOTE) SARS-CoV-2 target nucleic acids are NOT DETECTED.  The SARS-CoV-2 RNA is generally detectable in upper respiratory specimens during the acute phase of infection. The lowest concentration of SARS-CoV-2 viral copies this assay can detect is 138 copies/mL. A negative result does not preclude SARS-Cov-2 infection and should not be used as the sole basis for treatment or other patient management decisions. A negative result may occur with  improper specimen collection/handling, submission of specimen other than nasopharyngeal swab, presence of viral mutation(s) within the areas targeted by  this assay, and inadequate number of viral copies(<138 copies/mL). A negative result must be combined with clinical observations, patient history, and epidemiological information. The expected result is Negative.  Fact Sheet for Patients:  EntrepreneurPulse.com.au  Fact Sheet for Healthcare Providers:  IncredibleEmployment.be  This test is no t yet approved or cleared by the [place] FDA and  has been authorized for detection and/or diagnosis of SARS-CoV-2 by FDA under an Emergency Use Authorization (EUA). This EUA will remain  in effect (meaning this test can be used) for the duration of the COVID-19 declaration under Section 564(b)(1) of the Act, 21 U.S.C.section 360bbb-3(b)(1), unless the authorization is terminated  or revoked sooner.       Influenza A by PCR NEGATIVE NEGATIVE Final   Influenza B by PCR NEGATIVE NEGATIVE Final    Comment: (NOTE) The Xpert Xpress SARS-CoV-2/FLU/RSV plus assay is intended as an aid in the diagnosis of influenza from Nasopharyngeal swab specimens and should not be used as a sole basis for treatment. Nasal washings and aspirates are unacceptable for Xpert Xpress SARS-CoV-2/FLU/RSV testing.  Fact Sheet for Patients: EntrepreneurPulse.com.au  Fact Sheet for Healthcare Providers: IncredibleEmployment.be  This test is not yet approved or cleared by the [place] FDA and has been authorized for detection and/or diagnosis of SARS-CoV-2 by FDA under an Emergency Use Authorization (EUA). This EUA will remain in effect (meaning this test can be used) for the duration of the COVID-19 declaration under Section 564(b)(1) of the Act, 21 U.S.C. section 360bbb-3(b)(1), unless the authorization is terminated or revoked.  Performed at [facility]   Blood culture (routine x 2)     Status: None   Collection Time: 01/16/21 11:37 PM   Specimen: BLOOD  Result  Value Ref Range Status   Specimen Description BLOOD RIGHT ANTECUBITAL  Final   Special Requests   Final    BOTTLES DRAWN AEROBIC AND ANAEROBIC Blood Culture results may not be optimal due to an inadequate volume of blood received in culture bottles   Culture   Final    NO GROWTH 5 DAYS Performed at [facility], [place] [address], [place], [place] [address]    Report Status 01/22/2021 FINAL  Final  Blood  culture (routine x 2)     Status: None   Collection Time: 01/16/21 11:40 PM   Specimen: BLOOD  Result Value Ref Range Status   Specimen Description BLOOD RIGHT ANTECUBITAL  Final   Special Requests   Final    BOTTLES DRAWN AEROBIC AND ANAEROBIC Blood Culture results may not be optimal due to an inadequate volume of blood received in culture bottles   Culture   Final    NO GROWTH 5 DAYS Performed at [facility], [place] [address], [place], [place] [address]    Report Status 01/22/2021 FINAL  Final    Please note: You were cared for by a hospitalist during your hospital stay. Once you are discharged, your primary care physician will handle any further medical issues. Please note that NO REFILLS for any discharge medications will be authorized once you are discharged, as it is imperative that you return to your primary care physician (or establish a relationship with a primary care physician if you do not have one) for your post hospital discharge needs so that they can reassess your need for medications and monitor your lab values.    Time coordinating discharge: 40 minutes  SIGNED:   Shelly Coss, MD  [facility] 01/24/2021, 10:24 AM Pager [phone]  If 7PM-7AM, please contact night-coverage [email or web address] Password [email or web address]

## 2021-01-24 NOTE — Progress Notes (Addendum)
Pharmacy Antibiotic Note  Stacey Kaiser is a 50 y.o. female admitted on 01/16/2021 with discitis. She is s/p IR aspiration. Disc space was inaccessible so unfortunately cultures were not able to be obtained. Pharmacy has been consulted for empiric vancomycin dosing.   VT was 6 on 1250mg  Q12 hr. Will need Q12 hr dosing to get to goal trough of ~15.   Plan: - Increase vancomycin IV to 1000 mg every 12 hours (goal VT ~15) - Continue ceftriaxone 2g IV every 24 hours - OPAT orders updated, end date 03/02/21 - Informed home infusion of dose change   Height: 4\' 10"  (147.3 cm) Weight: 124.7 kg (275 lb) IBW/kg (Calculated) : 40.9  Temp (24hrs), Avg:98.3 F (36.8 C), Min:98.1 F (36.7 C), Max:98.4 F (36.9 C)  Recent Labs  Lab 01/18/21 0400 01/20/21 0331 01/21/21 0942 01/22/21 0435 01/22/21 0855 01/23/21 0323 01/24/21 0327  WBC 11.6* 11.1*  --   --   --   --   --   CREATININE 0.62 0.57 0.65 0.72  --  0.83 0.87  VANCOTROUGH  --   --   --   --  6*  --   --     Estimated Creatinine Clearance: 90.9 mL/min (by C-G formula based on SCr of 0.87 mg/dL).    Allergies  Allergen Reactions  . [place] Hives  . Other     Bees and wasps    Antimicrobials this admission: Ceftriaxone 4/6 >> (5/18) Vancomycin 4/6 >> (5/18)   4/9 VT = 6 drawn correctly   Microbiology results: 4/3 BCx: ng <4 days   03-30-2000, PharmD, BCPS, [facility] Clinical Pharmacist  Please check AMION for all [facility] Pharmacy phone numbers After 10:00 PM, call Main Pharmacy [phone]

## 2021-02-04 ENCOUNTER — Encounter: Payer: Self-pay | Admitting: Physician Assistant

## 2021-02-09 ENCOUNTER — Other Ambulatory Visit: Payer: Self-pay

## 2021-02-09 ENCOUNTER — Ambulatory Visit (INDEPENDENT_AMBULATORY_CARE_PROVIDER_SITE_OTHER): Payer: Self-pay | Admitting: Family

## 2021-02-09 ENCOUNTER — Encounter: Payer: Self-pay | Admitting: Family

## 2021-02-09 DIAGNOSIS — M4644 Discitis, unspecified, thoracic region: Secondary | ICD-10-CM

## 2021-02-09 NOTE — Assessment & Plan Note (Signed)
Ms. Deltoro is a 50 year old female with discitis and compression fracture and T8.  IR aspiration completed with cultures without growth.  Currently remains on culture-negative treatment with vancomycin and ceftriaxone which she is tolerating well.  Pain is significantly improved.  Continue current dose of vancomycin and ceftriaxone with end date of 03/02/2021.  Plan for follow-up in 2 to 3 weeks.

## 2021-02-09 NOTE — Patient Instructions (Signed)
Nice to speak with you.  Glad to hear that your back is feeling much better.  Continue your current dose of vancomycin and ceftriaxone through 03/02/2021.  We will arrange follow-up near completion of treatment to determine any additional need for antibiotics at that time.  Have a great day and stay safe!

## 2021-02-09 NOTE — Progress Notes (Signed)
Subjective:    Patient ID: Stacey Kaiser, female    DOB: 1970/10/20, 50 y.o.   MRN: [id number]  Chief Complaint  Patient presents with  . Follow-up    HH: [facility] start ,labs done tomorrow,     Virtual Visit via Telephone/Video Note   I connected with Stacey Kaiser on 02/09/2021 at 3:05pm by video and verified that I am speaking with the correct person using two identifiers.   I discussed the limitations, risks, security and privacy concerns of performing an evaluation and management service by telephone and the availability of in person appointments. I also discussed with the patient that there may be a patient responsible charge related to this service. The patient expressed understanding and agreed to proceed.  Location:  Patient: Home in [place] Provider: Clinic at [facility]    HPI:  Stacey Kaiser is a 50 y.o. female with previous medical history of scoliosis and morbid obesity admitted to [facility] with back pain.  Stacey Kaiser has been experiencing back pain starting in January 2022 when she was initially seen by telemedicine.  On 4/3 the pain was noted to be different and she felt a popping/twisting sensation which made the pain worse.  CT scan on admission showed discitis and compression fracture in T8.  Neurosurgery evaluated with no surgical recommendations.  IR aspirated with no growth on cultures.  Discharged with outpatient antibiotic therapy with vancomycin and ceftriaxone.  End date of treatment scheduled for 03/02/2021.  Telehealth visit today for hospitalization follow-up.  Stacey Kaiser has been receiving her vancomycin and ceftriaxone as prescribed with no adverse side effects or missed doses since leaving the hospital.  Pain has improved significantly and she is able to have more mobility.  Prior to discharge she was only able to walk 3 steps and today was able to sit up with minimal pain.  She is using less pain medication.  Timing of  pain is worse at night.  PICC line is infusing well.  Denies any systemic symptoms including fever/chills.   Allergies  Allergen Reactions  . Okra Hives  . Other     Bees and wasps      Outpatient Medications Prior to Visit  Medication Sig Dispense Refill  . albuterol (VENTOLIN HFA) 108 (90 Base) MCG/ACT inhaler Inhale 2 puffs into the lungs every 4 (four) hours as needed for wheezing or shortness of breath.    . cefTRIAXone (ROCEPHIN) IVPB Inject 2 g into the vein daily. Indication:  discitis First Dose: Yes Last Day of Therapy:  03/02/2021 Labs - Once weekly:  CBC/D and BMP, Labs - Every other week:  ESR and CRP Method of administration: IV Push Method of administration may be changed at the discretion of home infusion pharmacist based upon assessment of the patient and/or caregiver's ability to self-administer the medication ordered. 42 Units 0  . Coenzyme Q10 (CO Q 10 PO) Take 1 tablet by mouth daily. gummy    . Cyanocobalamin (VITAMIN B-12 PO) Take 1 tablet by mouth daily. gummy    . furosemide (LASIX) 20 MG tablet Take 1 tablet (20 mg total) by mouth daily. 30 tablet 0  . gabapentin (NEURONTIN) 300 MG capsule Take 2 capsules (600 mg total) by mouth 3 (three) times daily. 90 capsule 1  . HYDROcodone-acetaminophen (NORCO/VICODIN) 5-325 MG tablet Take 2 tablets by mouth every 4 (four) hours as needed for moderate pain. 30 tablet 0  . ibuprofen (ADVIL) 200 MG tablet Take 800 mg by mouth every 6 (six)  hours as needed for mild pain or headache.    . lidocaine (LIDODERM) 5 % Place 1 patch onto the skin daily. Remove & Discard patch within 12 hours or as directed by MD 30 patch 0  . methocarbamol (ROBAXIN) 500 MG tablet Take 1 tablet (500 mg total) by mouth every 6 (six) hours as needed for muscle spasms. 60 tablet 0  . Multiple Vitamins-Minerals (HAIR SKIN AND NAILS FORMULA PO) Take 1 tablet by mouth daily.    . Multiple Vitamins-Minerals (ONE-A-DAY WOMENS PETITES PO) Take 1 tablet by  mouth daily.    . Omega-3 Fatty Acids (OMEGA-3 FISH OIL PO) Take 1 capsule by mouth daily.    Marland Kitchen OVER THE COUNTER MEDICATION Take 1 tablet by mouth daily. Power c gummy    . polyethylene glycol (MIRALAX / GLYCOLAX) 17 g packet Take 17 g by mouth daily. 14 each 0  . potassium chloride 20 MEQ TBCR Take 20 mEq by mouth daily. 30 tablet 0  . vancomycin IVPB Inject 1,000 mg into the vein every 12 (twelve) hours. Indication:  discitis First Dose: Yes Last Day of Therapy:  03/02/2021 Labs - Sunday/Monday:  CBC/D, BMP, and vancomycin trough. Labs - Thursday:  BMP and vancomycin trough Labs - Every other week:  ESR and CRP Method of administration:Elastomeric Method of administration may be changed at the discretion of the patient and/or caregiver's ability to self-administer the medication ordered. 75 Units 0   No facility-administered medications prior to visit.     Past Medical History:  Diagnosis Date  . Diskitis 01/17/2021  . Family history of adverse reaction to anesthesia    " my mother had a seizure coming out of cabg surgery "  . Morbid obesity ([place])   . Morbid obesity with BMI of 50.0-59.9, adult ([place]) 01/17/2021  . Scoliosis      Past Surgical History:  Procedure Laterality Date  . RADIOLOGY WITH ANESTHESIA N/A 01/18/2021   Procedure: IR WITH ANESTHESIA;  Surgeon: Radiologist, Medication, MD;  Location: [place];  Service: Radiology;  Laterality: N/A;  . WISDOM TOOTH EXTRACTION         Review of Systems  Constitutional: Negative for chills, diaphoresis, fatigue and fever.  Respiratory: Negative for cough, chest tightness, shortness of breath and wheezing.   Cardiovascular: Negative for chest pain.  Gastrointestinal: Negative for abdominal pain, diarrhea, nausea and vomiting.  Musculoskeletal: Positive for back pain.      Objective:    Nursing note and vital signs reviewed.     Stacey Kaiser is pleasant to speak with and is in no apparent distress.  Overall appears to  be doing well.  Physical exam limited secondary to telehealth visit. Assessment & Plan:   Problem List Items Addressed This Visit      Musculoskeletal and Integument   Discitis    Stacey Kaiser is a 50 year old female with discitis and compression fracture and T8.  IR aspiration completed with cultures without growth.  Currently remains on culture-negative treatment with vancomycin and ceftriaxone which she is tolerating well.  Pain is significantly improved.  Continue current dose of vancomycin and ceftriaxone with end date of 03/02/2021.  Plan for follow-up in 2 to 3 weeks.          I am having Stacey Kaiser maintain her albuterol, ibuprofen, Multiple Vitamins-Minerals (ONE-A-DAY WOMENS PETITES PO), Coenzyme Q10 (CO Q 10 PO), Cyanocobalamin (VITAMIN B-12 PO), Multiple Vitamins-Minerals (HAIR SKIN AND NAILS FORMULA PO), Omega-3 Fatty Acids (OMEGA-3 FISH OIL PO), OVER THE  COUNTER MEDICATION, cefTRIAXone, gabapentin, HYDROcodone-acetaminophen, polyethylene glycol, methocarbamol, lidocaine, furosemide, Potassium Chloride ER, and vancomycin.   I discussed the assessment and treatment plan with the patient. The patient was provided an opportunity to ask questions and all were answered. The patient agreed with the plan and demonstrated an understanding of the instructions.   The patient was advised to call back or seek an in-person evaluation if the symptoms worsen or if the condition fails to improve as anticipated.   I provided 11  minutes of non-face-to-face time during this encounter.  Follow-up: Return in about 3 weeks (around 03/02/2021), or if symptoms worsen or fail to improve.   Terri Piedra, MSN, FNP-C Nurse Practitioner [facility] [place] number: [phone]

## 2021-02-14 ENCOUNTER — Encounter: Payer: Self-pay | Admitting: Physician Assistant

## 2021-02-15 ENCOUNTER — Telehealth: Payer: Self-pay

## 2021-02-15 NOTE — Telephone Encounter (Signed)
Great. No actions needed. Thanks.

## 2021-02-15 NOTE — Telephone Encounter (Signed)
Stacey Kaiser with [facility] called to give results for patient's repeat potassium level. Patient's potassium is a 3.8. Stacey Kaiser

## 2021-02-23 NOTE — Progress Notes (Signed)
Patient ID: Stacey Kaiser, female   DOB: [birth year], 50 y.o.   MRN: [id number]     Stacey Kaiser, is a 50 y.o. female  DGL:[id number]  JJO:[id number]  DOB - 1971-08-01  Subjective:  Chief Complaint and HPI: Stacey Kaiser is a 50 y.o. female here today to establish care and for a follow up visit After hospitalization 4/3-4/08/2021 for acute discitis.  She is doing much better.  Feels as though she is getting stronger daily.  She is continuing to use a walker for ambulation.  She is going daily for antibiotic infusion and completes that next week.  Her appetite is good.  No fever.  Bowels moving normally.  She says she has bloating daily after infusion.  SOB has improved.  She needs RF on meds.  No CP.  Her SO is with her and says he is noticing a lot of improvement.  She is doing home PT exercises daily.    From discharge summary: Brief/Interim Summary:  Patient is a 50 year old female with history of morbid obesity with BMI of 57 who presents with severe back pain. She was seen and treated with different medications for back pain as an outpatient but it did not resolve so she presented to the emergency department. CT imagings showed discitis and compression fracture at T8. 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 ID/IR/neurosurgery consulted. Underwent disc biopsy by IR on 01/18/21. Started on IV antibiotics. Hospital course remarkable for severe back pain with radiation. PT/OT evaluation recommending HH.  Back pain is much better today.  She is medically stable for discharge to home today, she will continue IV antibiotics at home.  Following problems were addressed during her hospitalization:  Discitis/osteomyelitis:Imagings showed discitis/osteomyelitis on T8-T9 with compression fracture of T9 and also presence of epidural phlegmon. ID/IR/neurosurgery consulted. Underwent T9  core biopsies on 01/18/21 under general anesthesia . Blood Cultures have been sent ,NGTD. Surgical pathology did not show evidence of acute osteomyelitis. Has been started on IV antibiotics now. ID recommended to continue vancomycin and ceftriaxone for 6 weeks and follow-up as an outpatient. PT recommended home health.  Back pain is much better now.  Abdominal distention: Distended abdomen but denies any abdominal pain. Abdominal imaging she did not show any intra-abdominal or intrapelvic pathology. Continue bowel regimen for constipation.  Morbid obesity: BMI 57.48. Weight loss, healthy diet encouraged  Elevated ZSW:FUXN elevated TSH of 7.7. Normal free T4 and T3. Will not start on Synthyroid.  Uninsured: TOC consulted  Hypokalemia: Potassium supplemented   Respiratory insufficiency:. No history of respiratory disease. Chest x-ray some cardiomegaly with congestion and small bilateral pleural effusion. Lungs are clear on auscultation. BNP elevated. Echocardiogram showed normal left ventricular function with ejection fraction of 55 to 23%, normal diastolic parameters, moderately elevated pulmonary artery systolic pressure.  Her respiratory insufficiency could be contributed by undiagnosed OSA/obesity hypoventilation syndromeversus pulmonaryarteryhypertension. She needs to have a sleep study as an outpatient.  Currently she is saturating fine on room air after aggressive diuresis.  Continue Lasix 20 mg daily at home.  Morbid obesity:BMI 57.4    Discharge Diagnoses:  Principal Problem:   Discitis Active Problems:   Morbid obesity with BMI of 50.0-59.9, adult [facility])    ED/Hospital notes reviewed.     ROS:   Constitutional:  No f/c, No night sweats, No unexplained weight loss. EENT:  No vision changes, No blurry vision, No hearing changes. No mouth, throat, or ear problems.  Respiratory: No  cough, improving but mild SOB Cardiac: No CP, no  palpitations GI:  No abd pain, No N/V/D. GU: No Urinary s/sx Musculoskeletal: improving thoracic back pain and leg strength Neuro: No headache, no dizziness, no motor weakness.  Skin: No rash Endocrine:  No polydipsia. No polyuria.  Psych: Denies SI/HI  No problems updated.  ALLERGIES: Allergies  Allergen Reactions  . Okra Hives  . Other     Bees and wasps    PAST MEDICAL HISTORY: Past Medical History:  Diagnosis Date  . Diskitis 01/17/2021  . Family history of adverse reaction to anesthesia    " my mother had a seizure coming out of cabg surgery "  . Morbid obesity ([place])   . Morbid obesity with BMI of 50.0-59.9, adult ([place]) 01/17/2021  . Scoliosis     MEDICATIONS AT HOME: Prior to Admission medications   Medication Sig Start Date End Date Taking? Authorizing Provider  cefTRIAXone (ROCEPHIN) IVPB Inject 2 g into the vein daily. Indication:  discitis First Dose: Yes Last Day of Therapy:  03/02/2021 Labs - Once weekly:  CBC/D and BMP, Labs - Every other week:  ESR and CRP Method of administration: IV Push Method of administration may be changed at the discretion of home infusion pharmacist based upon assessment of the patient and/or caregiver's ability to self-administer the medication ordered. 01/24/21 03/07/21 Yes Shelly Coss, MD  Coenzyme Q10 (CO Q 10 PO) Take 1 tablet by mouth daily. gummy   Yes [provider]  Cyanocobalamin (VITAMIN B-12 PO) Take 1 tablet by mouth daily. gummy   Yes [provider]  ibuprofen (ADVIL) 200 MG tablet Take 800 mg by mouth every 6 (six) hours as needed for mild pain or headache.   Yes [provider]  lidocaine (LIDODERM) 5 % Place 1 patch onto the skin daily. Remove & Discard patch within 12 hours or as directed by MD 01/24/21  Yes Shelly Coss, MD  Multiple Vitamins-Minerals (HAIR SKIN AND NAILS FORMULA PO) Take 1 tablet by mouth daily.   Yes [provider]  Multiple Vitamins-Minerals (ONE-A-DAY  WOMENS PETITES PO) Take 1 tablet by mouth daily.   Yes [provider]  Omega-3 Fatty Acids (OMEGA-3 FISH OIL PO) Take 1 capsule by mouth daily.   Yes [provider]  OVER THE COUNTER MEDICATION Take 1 tablet by mouth daily. Power c gummy   Yes [provider]  potassium chloride 20 MEQ TBCR Take 20 mEq by mouth daily. 01/24/21  Yes Shelly Coss, MD  vancomycin IVPB Inject 1,000 mg into the vein every 12 (twelve) hours. Indication:  discitis First Dose: Yes Last Day of Therapy:  03/02/2021 Labs - Sunday/Monday:  CBC/D, BMP, and vancomycin trough. Labs - Thursday:  BMP and vancomycin trough Labs - Every other week:  ESR and CRP Method of administration:Elastomeric Method of administration may be changed at the discretion of the patient and/or caregiver's ability to self-administer the medication ordered. 01/24/21 03/02/21 Yes Shelly Coss, MD  albuterol (VENTOLIN HFA) 108 (90 Base) MCG/ACT inhaler Inhale 2 puffs into the lungs every 4 (four) hours as needed for wheezing or shortness of breath. 01/10/21 02/09/21  [provider]  furosemide (LASIX) 20 MG tablet Take 1 tablet (20 mg total) by mouth daily. 02/24/21 02/24/22  Argentina Donovan, PA-C  gabapentin (NEURONTIN) 300 MG capsule Take 2 capsules (600 mg total) by mouth 3 (three) times daily. 02/24/21   Argentina Donovan, PA-C  methocarbamol (ROBAXIN) 500 MG tablet Take 1 tablet (  500 mg total) by mouth every 6 (six) hours as needed for muscle spasms. 02/24/21   Argentina Donovan, PA-C  polyethylene glycol (MIRALAX / GLYCOLAX) 17 g packet Take 17 g by mouth daily. 01/24/21   Shelly Coss, MD     Objective:  EXAM:   Vitals:   02/24/21 1436  BP: 137/84  Pulse: (!) 114  Resp: 20  SpO2: 94%  Weight: 293 lb 3.2 oz (133 kg)  Height: 5' 3"  (1.6 m)    General appearance : A&OX3. NAD. Non-toxic-appearing; in a wheelchair provided by our office today.  Has her walker with her too.   HEENT: Atraumatic and  Normocephalic.  PERRLA. EOM intact.  Neck: supple, no JVD. No cervical lymphadenopathy. No thyromegaly Chest/Lungs:  Breathing-non-labored, Good air entry bilaterally, breath sounds normal without rales, rhonchi, or wheezing  CVS: S1 S2 regular, no murmurs, gallops, rubs  Abdomen: Bowel sounds present, Non tender and not distended with no gaurding, rigidity or rebound. Extremities: Bilateral Lower Ext shows 2+ edema, both legs are warm to touch with = pulse throughout Neurology:  CN II-XII grossly intact, Non focal.   Psych:  TP linear. J/I WNL. Normal speech. Appropriate eye contact and affect.  Skin:  No Rash  Data Review Lab Results  Component Value Date   HGBA1C 5.7 (H) 01/17/2021     Assessment & Plan   1. Discitis of thoracic region Improving and has another week on IV infusions.  Port in place in R arm without redness or swelling - gabapentin (NEURONTIN) 300 MG capsule; Take 2 capsules (600 mg total) by mouth 3 (three) times daily.  Dispense: 90 capsule; Refill: 2 - methocarbamol (ROBAXIN) 500 MG tablet; Take 1 tablet (500 mg total) by mouth every 6 (six) hours as needed for muscle spasms.  Dispense: 60 tablet; Refill: 0  2. Hypokalemia RF KCl 71mq daily  3. Abnormal TSH Recheck today - Thyroid Panel With TSH  4. Respiratory insufficiency Per hospital note-concern for pulmonary htn and elevated BNP - Ambulatory referral to Cardiology - Comprehensive metabolic panel  5. Elevated brain natriuretic peptide (BNP) level - Ambulatory referral to Cardiology - furosemide (LASIX) 20 MG tablet; Take 1 tablet (20 mg total) by mouth daily.  Dispense: 30 tablet; Refill: 2 - Comprehensive metabolic panel - Brain natriuretic peptide  6. Hospital discharge follow-up  7. Sleep apnea, unspecified type - Split night study; Future  8. Weight gain May be due to lack of activity but also concern for worsening heart function - Ambulatory referral to Cardiology - furosemide (LASIX)  20 MG tablet; Take 1 tablet (20 mg total) by mouth daily.  Dispense: 30 tablet; Refill: 2 - Comprehensive metabolic panel - Brain natriuretic peptide   Patient have been counseled extensively about nutrition and exercise  Return in about 6 weeks (around 04/07/2021) for assign PCP;  recheck.  The patient was given clear instructions to go to ER or return to medical center if symptoms don't improve, worsen or new problems develop. The patient verbalized understanding. The patient was told to call to get lab results if they haven't heard anything in the next week.     AFreeman Caldron PA-C [facility] W[facility] [place]  02/24/2021, 3:07 PM

## 2021-02-24 ENCOUNTER — Other Ambulatory Visit: Payer: Self-pay

## 2021-02-24 ENCOUNTER — Encounter: Payer: Self-pay | Admitting: Physician Assistant

## 2021-02-24 ENCOUNTER — Ambulatory Visit: Payer: Self-pay | Attending: Physician Assistant | Admitting: Physician Assistant

## 2021-02-24 VITALS — BP 137/84 | HR 114 | Resp 20 | Ht 63.0 in | Wt 293.2 lb

## 2021-02-24 DIAGNOSIS — R0689 Other abnormalities of breathing: Secondary | ICD-10-CM

## 2021-02-24 DIAGNOSIS — Z09 Encounter for follow-up examination after completed treatment for conditions other than malignant neoplasm: Secondary | ICD-10-CM

## 2021-02-24 DIAGNOSIS — R7989 Other specified abnormal findings of blood chemistry: Secondary | ICD-10-CM

## 2021-02-24 DIAGNOSIS — G4730 Sleep apnea, unspecified: Secondary | ICD-10-CM

## 2021-02-24 DIAGNOSIS — R635 Abnormal weight gain: Secondary | ICD-10-CM

## 2021-02-24 DIAGNOSIS — M4644 Discitis, unspecified, thoracic region: Secondary | ICD-10-CM

## 2021-02-24 DIAGNOSIS — E876 Hypokalemia: Secondary | ICD-10-CM

## 2021-02-24 MED ORDER — FUROSEMIDE 20 MG PO TABS
20.0000 mg | ORAL_TABLET | Freq: Every day | ORAL | 2 refills | Status: DC
  Filled 2021-02-24: qty 30, 30d supply, fill #0

## 2021-02-24 MED ORDER — METHOCARBAMOL 500 MG PO TABS
500.0000 mg | ORAL_TABLET | Freq: Four times a day (QID) | ORAL | 0 refills | Status: DC | PRN
  Filled 2021-02-24: qty 60, 15d supply, fill #0

## 2021-02-24 MED ORDER — POTASSIUM CHLORIDE ER 20 MEQ PO TBCR
20.0000 meq | EXTENDED_RELEASE_TABLET | Freq: Every day | ORAL | 2 refills | Status: DC
  Filled 2021-02-24: qty 30, 30d supply, fill #0

## 2021-02-24 MED ORDER — GABAPENTIN 300 MG PO CAPS
600.0000 mg | ORAL_CAPSULE | Freq: Three times a day (TID) | ORAL | 2 refills | Status: DC
  Filled 2021-02-24: qty 90, 15d supply, fill #0

## 2021-02-25 ENCOUNTER — Encounter: Payer: Self-pay | Admitting: Physician Assistant

## 2021-02-25 LAB — THYROID PANEL WITH TSH
Free Thyroxine Index: 1.9 (ref 1.2–4.9)
T3 Uptake Ratio: 23 % — ABNORMAL LOW (ref 24–39)
T4, Total: 8.1 ug/dL (ref 4.5–12.0)
TSH: 3.66 u[IU]/mL (ref 0.450–4.500)

## 2021-02-25 LAB — COMPREHENSIVE METABOLIC PANEL
ALT: 17 IU/L (ref 0–32)
AST: 15 IU/L (ref 0–40)
Albumin/Globulin Ratio: 1.7 (ref 1.2–2.2)
Albumin: 4.2 g/dL (ref 3.8–4.8)
Alkaline Phosphatase: 100 IU/L (ref 44–121)
BUN/Creatinine Ratio: 10 (ref 9–23)
BUN: 12 mg/dL (ref 6–24)
Bilirubin Total: 0.3 mg/dL (ref 0.0–1.2)
CO2: 18 mmol/L — ABNORMAL LOW (ref 20–29)
Calcium: 9.2 mg/dL (ref 8.7–10.2)
Chloride: 105 mmol/L (ref 96–106)
Creatinine, Ser: 1.18 mg/dL — ABNORMAL HIGH (ref 0.57–1.00)
Globulin, Total: 2.5 g/dL (ref 1.5–4.5)
Glucose: 103 mg/dL — ABNORMAL HIGH (ref 65–99)
Potassium: 4.7 mmol/L (ref 3.5–5.2)
Sodium: 143 mmol/L (ref 134–144)
Total Protein: 6.7 g/dL (ref 6.0–8.5)
eGFR: 56 mL/min/{1.73_m2} — ABNORMAL LOW (ref >59)

## 2021-02-25 LAB — BRAIN NATRIURETIC PEPTIDE: BNP: 63.7 pg/mL (ref 0.0–100.0)

## 2021-02-28 ENCOUNTER — Encounter (HOSPITAL_BASED_OUTPATIENT_CLINIC_OR_DEPARTMENT_OTHER): Payer: Self-pay | Admitting: Emergency Medicine

## 2021-02-28 ENCOUNTER — Telehealth: Payer: Self-pay

## 2021-02-28 ENCOUNTER — Emergency Department (HOSPITAL_BASED_OUTPATIENT_CLINIC_OR_DEPARTMENT_OTHER)
Admission: EM | Admit: 2021-02-28 | Discharge: 2021-02-28 | Disposition: A | Discharge status: Home / Self Care | Payer: Self-pay | Attending: Emergency Medicine | Admitting: Emergency Medicine

## 2021-02-28 ENCOUNTER — Other Ambulatory Visit: Payer: Self-pay

## 2021-02-28 ENCOUNTER — Emergency Department (HOSPITAL_BASED_OUTPATIENT_CLINIC_OR_DEPARTMENT_OTHER): Payer: Self-pay

## 2021-02-28 DIAGNOSIS — Z452 Encounter for adjustment and management of vascular access device: Secondary | ICD-10-CM | POA: Insufficient documentation

## 2021-02-28 DIAGNOSIS — R000 Tachycardia, unspecified: Secondary | ICD-10-CM | POA: Insufficient documentation

## 2021-02-28 DIAGNOSIS — R509 Fever, unspecified: Secondary | ICD-10-CM | POA: Insufficient documentation

## 2021-02-28 LAB — CBC WITH DIFFERENTIAL/PLATELET
Abs Immature Granulocytes: 0.03 10*3/uL (ref 0.00–0.07)
Basophils Absolute: 0 10*3/uL (ref 0.0–0.1)
Basophils Relative: 0 %
Eosinophils Absolute: 0.9 10*3/uL — ABNORMAL HIGH (ref 0.0–0.5)
Eosinophils Relative: 18 %
HCT: 39.2 % (ref 36.0–46.0)
Hemoglobin: 12.9 g/dL (ref 12.0–15.0)
Immature Granulocytes: 1 %
Lymphocytes Relative: 12 %
Lymphs Abs: 0.6 10*3/uL — ABNORMAL LOW (ref 0.7–4.0)
MCH: 29.5 pg (ref 26.0–34.0)
MCHC: 32.9 g/dL (ref 30.0–36.0)
MCV: 89.7 fL (ref 80.0–100.0)
Monocytes Absolute: 0.3 10*3/uL (ref 0.1–1.0)
Monocytes Relative: 6 %
Neutro Abs: 3.1 10*3/uL (ref 1.7–7.7)
Neutrophils Relative %: 63 %
Platelets: 290 10*3/uL (ref 150–400)
RBC: 4.37 MIL/uL (ref 3.87–5.11)
RDW: 13.9 % (ref 11.5–15.5)
WBC: 4.8 10*3/uL (ref 4.0–10.5)
nRBC: 0 % (ref 0.0–0.2)

## 2021-02-28 LAB — COMPREHENSIVE METABOLIC PANEL
ALT: 13 U/L (ref 0–44)
AST: 21 U/L (ref 15–41)
Albumin: 3.8 g/dL (ref 3.5–5.0)
Alkaline Phosphatase: 68 U/L (ref 38–126)
Anion gap: 10 (ref 5–15)
BUN: 11 mg/dL (ref 6–20)
CO2: 25 mmol/L (ref 22–32)
Calcium: 9 mg/dL (ref 8.9–10.3)
Chloride: 104 mmol/L (ref 98–111)
Creatinine, Ser: 1.03 mg/dL — ABNORMAL HIGH (ref 0.44–1.00)
GFR, Estimated: >60 mL/min (ref >60)
Glucose, Bld: 104 mg/dL — ABNORMAL HIGH (ref 70–99)
Potassium: 3.6 mmol/L (ref 3.5–5.1)
Sodium: 139 mmol/L (ref 135–145)
Total Bilirubin: 0.4 mg/dL (ref 0.3–1.2)
Total Protein: 6.6 g/dL (ref 6.5–8.1)

## 2021-02-28 LAB — LACTIC ACID, PLASMA: Lactic Acid, Venous: 0.8 mmol/L (ref 0.5–1.9)

## 2021-02-28 IMAGING — DX DG CHEST 1V PORT
1 series · 1 of 1 positions shown · non-contrast
Comparison: [DATE]

CLINICAL DATA: Fevers and possible PICC line infection

EXAM:
PORTABLE CHEST 1 VIEW

[chest]
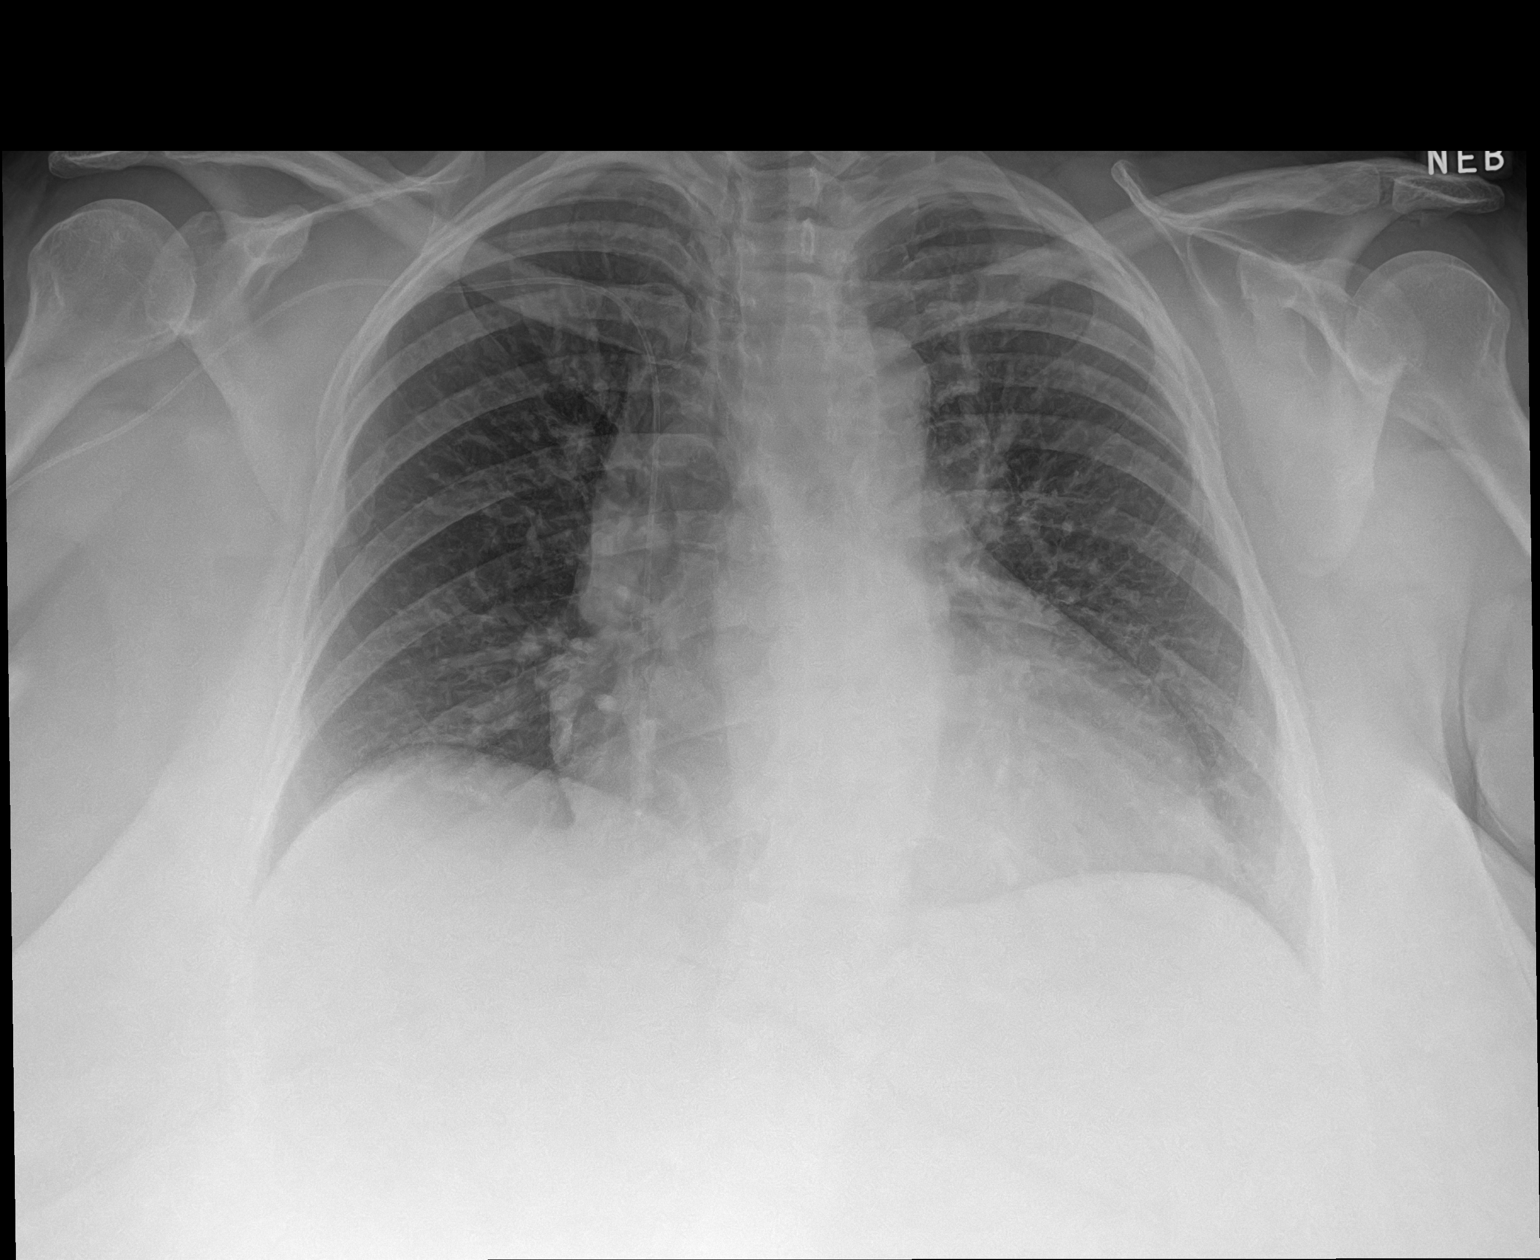

[1 of 1 positions shown; findings below may reference images not displayed]

FINDINGS: Cardiac shadow is enlarged but stable. The lungs are well aerated
bilaterally. PICC line is noted in satisfactory position. No bony
abnormality is seen.
IMPRESSION: No acute abnormality noted.

## 2021-02-28 NOTE — ED Triage Notes (Signed)
Pt arrives to ED with c/o of pus at PICC line insertion site. Pt reports when home health RN changed the dressing today puss, pain, and redness was noted. PICC line is still able to be flushed w/o pain.

## 2021-02-28 NOTE — Telephone Encounter (Addendum)
Patient's nurse Marylene Land) with [facility] called stating she was in the patient's home today and patient's picc line appears to be infected. Marylene Land reports pus and drainage at the picc insertion site and patient has a temperature of 100.6.  She states patient stated she feels like she had a fever yesterday but unsure because she does not have a thermometer. Marylene Land and patient advised that patient will need to go the ED to have the picc line evaluated per Tammy Sours, NP.  Nurse and patient verbalized understanding. Vincen Bejar T Pricilla Loveless

## 2021-02-28 NOTE — Telephone Encounter (Signed)
Agree with plan as written 

## 2021-02-28 NOTE — ED Provider Notes (Signed)
[facility] EMERGENCY DEPT Provider Note   CSN: [id number] Arrival date & time: 02/28/21  1646     History Chief Complaint  Patient presents with  . Vascular Access Problem    Donnalee Cellucci is a 50 y.o. female.  Patient is a 50 year old female with a history of morbid obesity and recent diagnosis of discitis who currently has a PICC line and is getting 6 weeks of ceftriaxone and vancomycin.  Her course of antibiotics will be complete on 03/02/2021.  Patient presents today due to concern for possible PICC line infection.  Patient reports yesterday she felt slightly feverish and not 100% but did not feel significantly terrible.  Today when home health came out to change the bandage when they removed the dressing they noticed a small amount of pus coming from the catheter.  Patient reported she had no pain at the catheter site had no arm redness or swelling.  She has been receiving the antibiotics without difficulty.  She reports the pain in her back is significantly getting better she has had no cough, shortness of breath, abdominal pain, diarrhea or urinary symptoms.  Because of the fever of 100.6, purulence at the insertion site they spoke with infectious disease office and they recommend she come to the emergency room.  The history is provided by the patient.       Past Medical History:  Diagnosis Date  . Diskitis 01/17/2021  . Family history of adverse reaction to anesthesia    " my mother had a seizure coming out of cabg surgery "  . Morbid obesity ([place])   . Morbid obesity with BMI of 50.0-59.9, adult ([place]) 01/17/2021  . Scoliosis     Patient Active Problem List   Diagnosis Date Noted  . Discitis 01/17/2021  . Morbid obesity with BMI of 50.0-59.9, adult ([place]) 01/17/2021    Past Surgical History:  Procedure Laterality Date  . RADIOLOGY WITH ANESTHESIA N/A 01/18/2021   Procedure: IR WITH ANESTHESIA;  Surgeon: Radiologist, Medication, MD;  Location: [facility];   Service: Radiology;  Laterality: N/A;  . WISDOM TOOTH EXTRACTION       OB History   No obstetric history on file.     Family History  Problem Relation Age of Onset  . Heart failure Mother   . Chronic Renal Failure Mother   . Diabetes Mother     Social History   Tobacco Use  . Smoking status: Never Smoker  . Smokeless tobacco: Never Used  Vaping Use  . Vaping Use: Never used  Substance Use Topics  . Alcohol use: Yes    Comment: rare  . Drug use: No    Home Medications Prior to Admission medications   Medication Sig Start Date End Date Taking? Authorizing Provider  albuterol (VENTOLIN HFA) 108 (90 Base) MCG/ACT inhaler Inhale 2 puffs into the lungs every 4 (four) hours as needed for wheezing or shortness of breath. 01/10/21 02/09/21  [provider]  cefTRIAXone (ROCEPHIN) IVPB Inject 2 g into the vein daily. Indication:  discitis First Dose: Yes Last Day of Therapy:  03/02/2021 Labs - Once weekly:  CBC/D and BMP, Labs - Every other week:  ESR and CRP Method of administration: IV Push Method of administration may be changed at the discretion of home infusion pharmacist based upon assessment of the patient and/or caregiver's ability to self-administer the medication ordered. 01/24/21 03/07/21  Shelly Coss, MD  Coenzyme Q10 (CO Q 10 PO) Take 1 tablet by mouth daily. gummy  [provider]  Cyanocobalamin (VITAMIN B-12 PO) Take 1 tablet by mouth daily. gummy    [provider]  furosemide (LASIX) 20 MG tablet Take 1 tablet (20 mg total) by mouth daily. 02/24/21 02/24/22  Argentina Donovan, PA-C  gabapentin (NEURONTIN) 300 MG capsule Take 2 capsules (600 mg total) by mouth 3 (three) times daily. 02/24/21   Argentina Donovan, PA-C  ibuprofen (ADVIL) 200 MG tablet Take 800 mg by mouth every 6 (six) hours as needed for mild pain or headache.    [provider]  lidocaine (LIDODERM) 5 % Place 1 patch onto the skin daily. Remove & Discard patch  within 12 hours or as directed by MD 01/24/21   Shelly Coss, MD  methocarbamol (ROBAXIN) 500 MG tablet Take 1 tablet (500 mg total) by mouth every 6 (six) hours as needed for muscle spasms. 02/24/21   Argentina Donovan, PA-C  Multiple Vitamins-Minerals (HAIR SKIN AND NAILS FORMULA PO) Take 1 tablet by mouth daily.    [provider]  Multiple Vitamins-Minerals (ONE-A-DAY WOMENS PETITES PO) Take 1 tablet by mouth daily.    [provider]  Omega-3 Fatty Acids (OMEGA-3 FISH OIL PO) Take 1 capsule by mouth daily.    [provider]  OVER THE COUNTER MEDICATION Take 1 tablet by mouth daily. Power c gummy    [provider]  Potassium Chloride ER 20 MEQ TBCR Take 1 tablet by mouth daily. 02/24/21   Argentina Donovan, PA-C  vancomycin IVPB Inject 1,000 mg into the vein every 12 (twelve) hours. Indication:  discitis First Dose: Yes Last Day of Therapy:  03/02/2021 Labs - Sunday/Monday:  CBC/D, BMP, and vancomycin trough. Labs - Thursday:  BMP and vancomycin trough Labs - Every other week:  ESR and CRP Method of administration:Elastomeric Method of administration may be changed at the discretion of the patient and/or caregiver's ability to self-administer the medication ordered. 01/24/21 03/02/21  Shelly Coss, MD    Allergies    Okra and Other  Review of Systems   Review of Systems  All other systems reviewed and are negative.   Physical Exam Updated Vital Signs BP (!) 155/93 (BP Location: Left Arm)   Pulse (!) 106   Temp 99.1 F (37.3 C) (Oral)   Resp 18   LMP 02/18/2021   SpO2 97%   Physical Exam Vitals and nursing note reviewed.  Constitutional:      General: She is not in acute distress.    Appearance: She is well-developed.  HENT:     Head: Normocephalic and atraumatic.  Eyes:     Pupils: Pupils are equal, round, and reactive to light.  Cardiovascular:     Rate and Rhythm: Regular rhythm. Tachycardia present.     Heart sounds: Normal  heart sounds. No murmur heard. No friction rub.  Pulmonary:     Effort: Pulmonary effort is normal.     Breath sounds: Normal breath sounds. No wheezing or rales.  Abdominal:     General: Bowel sounds are normal. There is no distension.     Palpations: Abdomen is soft.     Tenderness: There is no abdominal tenderness. There is no guarding or rebound.  Musculoskeletal:        General: No tenderness. Normal range of motion.     Comments: No edema  Skin:    General: Skin is warm and dry.     Findings: No rash.       Neurological:  Mental Status: She is alert and oriented to person, place, and time. Mental status is at baseline.     Cranial Nerves: No cranial nerve deficit.  Psychiatric:        Mood and Affect: Mood normal.        Behavior: Behavior normal.     ED Results / Procedures / Treatments   Labs (all labs ordered are listed, but only abnormal results are displayed) Labs Reviewed  CBC WITH DIFFERENTIAL/PLATELET - Abnormal; Notable for the following components:      Result Value   Lymphs Abs 0.6 (*)    Eosinophils Absolute 0.9 (*)    All other components within normal limits  COMPREHENSIVE METABOLIC PANEL - Abnormal; Notable for the following components:   Glucose, Bld 104 (*)    Creatinine, Ser 1.03 (*)    All other components within normal limits  CULTURE, BLOOD (ROUTINE X 2)  CULTURE, BLOOD (ROUTINE X 2)  LACTIC ACID, PLASMA    EKG None  Radiology DG Chest Port 1 View  Result Date: 02/28/2021 CLINICAL DATA:  Fevers and possible PICC line infection EXAM: PORTABLE CHEST 1 VIEW COMPARISON:  01/19/2021 FINDINGS: Cardiac shadow is enlarged but stable. The lungs are well aerated bilaterally. PICC line is noted in satisfactory position. No bony abnormality is seen. IMPRESSION: No acute abnormality noted. Electronically Signed   By: Inez Catalina M.D.   On: 02/28/2021 18:27    Procedures Procedures   Medications Ordered in ED Medications - No data to  display  ED Course  I have reviewed the triage vital signs and the nursing notes.  Pertinent labs & imaging results that were available during my care of the patient were reviewed by me and considered in my medical decision making (see chart for details).    MDM Rules/Calculators/A&P                          Patient presenting today due to concern of possible PICC line infection.  Patient did have some purulent drainage around the insertion site with bandage change today.  On appearance it does not appear grossly infected there is no induration, erythema spreading from the PICC line site.  Patient has been receiving Rocephin and vancomycin but reported she felt mildly feverish yesterday and had a temperature at home today of 100.6.  She denies other symptoms such as chest pain, shortness of breath, cough, abdominal pain, nausea vomiting or urinary symptoms.  Patient has 2 days left of antibiotics.  CBC with a normal white count of 4.8.  Patient's temperature here is 99.1 and that is without any fever reducer.  Will discuss with infectious disease for further recommendations.  Blood cultures are pending.  7:08 PM Spoke with Dr. Linus Salmons who recommended discharging patient home since she is well-appearing and continue to follow blood cultures.  Patient given return precautions.  MDM Number of Diagnoses or Management Options   Amount and/or Complexity of Data Reviewed Clinical lab tests: ordered and reviewed Tests in the radiology section of CPT: ordered and reviewed Independent visualization of images, tracings, or specimens: yes    Final Clinical Impression(s) / ED Diagnoses Final diagnoses:  Fever, unspecified fever cause    Rx / DC Orders ED Discharge Orders    None       Blanchie Dessert, MD 02/28/21 Einar Crow

## 2021-02-28 NOTE — Discharge Instructions (Signed)
Continue the same regime at this time.  Blood cultures are pending so if something comes back someone will call you.  If you start feeling severely ill, vomiting, diarrhea, chest pain, abdominal pain or other concerns please return for evaluation.

## 2021-03-01 ENCOUNTER — Other Ambulatory Visit: Payer: Self-pay

## 2021-03-01 NOTE — Telephone Encounter (Signed)
Edie with [facility] off [facility] called saying the patient came in yesderday looking for her prescriptions.  She noticed that the patient had a pic line and iv in her arm.  She just wanted to let the office know.  She told the patient she would call the office in the morning to check on her prescriptions.  CB#  [phone]

## 2021-03-02 NOTE — Telephone Encounter (Signed)
Patient called to confirm that there are no changes with regimen given recent ED visit. Patient was discharged from the ED after ID was consulted for possible PICC infection. Forwarding to provider to confirm end date of abx is today and no changes are needed.   Lasharn Bufkin Loyola Mast, RN

## 2021-03-03 ENCOUNTER — Other Ambulatory Visit: Payer: Self-pay

## 2021-03-03 DIAGNOSIS — M4644 Discitis, unspecified, thoracic region: Secondary | ICD-10-CM

## 2021-03-03 MED ORDER — CEFADROXIL 500 MG PO CAPS: 500.0000 mg | ORAL_CAPSULE | Freq: Two times a day (BID) | ORAL | 0 refills | Status: DC

## 2021-03-03 MED ORDER — DOXYCYCLINE HYCLATE 100 MG PO TABS: 100.0000 mg | ORAL_TABLET | Freq: Two times a day (BID) | ORAL | 0 refills | Status: DC

## 2021-03-03 NOTE — Telephone Encounter (Signed)
Verbal orders given to [facility] Blase) to pull PICC. Called patient and informed her of oral antibiotic regimen (doxycycline 100mg  bid and cefadroxil 500mg  bid). Patient verbalized understanding and confirmed pharmacy.   Follow up scheduled with in 3 weeks as is out of the office.   Terree Gaultney Rexene Alberts, RN

## 2021-03-03 NOTE — Telephone Encounter (Signed)
Ok to pull PICC line and will transition to oral antibiotics as her inflammatory markers remain slightly elevated. We can send in a prescription for doxycycline 100 mg twice daily and Cefodroxil 500 mg twice daily for one month and arrange follow up in 3 weeks.

## 2021-03-06 LAB — CULTURE, BLOOD (ROUTINE X 2)
Culture: NO GROWTH
Special Requests: ADEQUATE

## 2021-03-11 ENCOUNTER — Encounter: Payer: Self-pay | Admitting: Physician Assistant

## 2021-03-24 ENCOUNTER — Encounter: Payer: Self-pay | Admitting: Infectious Diseases

## 2021-03-24 ENCOUNTER — Ambulatory Visit (INDEPENDENT_AMBULATORY_CARE_PROVIDER_SITE_OTHER): Payer: Self-pay | Admitting: Infectious Diseases

## 2021-03-24 ENCOUNTER — Other Ambulatory Visit: Payer: Self-pay

## 2021-03-24 VITALS — BP 130/80 | HR 107 | Temp 98.1°F | Wt 263.0 lb

## 2021-03-24 DIAGNOSIS — M4624 Osteomyelitis of vertebra, thoracic region: Secondary | ICD-10-CM

## 2021-03-24 DIAGNOSIS — R7989 Other specified abnormal findings of blood chemistry: Secondary | ICD-10-CM

## 2021-03-24 DIAGNOSIS — M4644 Discitis, unspecified, thoracic region: Secondary | ICD-10-CM

## 2021-03-24 DIAGNOSIS — T80219D Unspecified infection due to central venous catheter, subsequent encounter: Secondary | ICD-10-CM

## 2021-03-24 DIAGNOSIS — T80219A Unspecified infection due to central venous catheter, initial encounter: Secondary | ICD-10-CM | POA: Insufficient documentation

## 2021-03-24 HISTORY — DX: Other specified abnormal findings of blood chemistry: R79.89

## 2021-03-24 NOTE — Progress Notes (Signed)
Patient: Stacey Kaiser  DOB: Mar 29, 1971 MRN: [id number] PCP: Patient, No Pcp Per (Inactive)     Subjective:  Stacey Kaiser is a 50 y.o. female with history of vertebral compression fractures and discitis/osteomyelitis on MRI April 3rd at T8-9 and T7-8.  There was a superimposed epidural phlegmon at the level of T8 identified on MRI. IR cultures were attempted at the disc level and without any growth.   She completed treatment for culture negative discitis with vancomycin and ceftriaxone on May 16th when she went to ER over concern for PICC line infection. She did well with the infusions and did not have trouble with anything until the last week. Felt like "fire was injected" when she would have anything infused into it. She also showed me a photo on the phone that she had purulent drainage coming out of it at the insertion site. Blood cultures were drawn and negative at that time and PICC was removed. This has since completely healed over and only a small scar in place.   Her back pain is significantly improved. Still some discomfort on the left rib cage and when she bends over/twists a certain way forward at the level of the Tspine. She is taking Advil during the day if she needs it and the muscle relaxer at night to help her sleep. Still using a walker to get around and feels her stamina and strength have really taken a hit. She has some nausea on the cefadroxil and doxycycline but no significant side effects.    Review of Systems  Constitutional:  Negative for chills and fever.  HENT:  Negative for tinnitus.   Eyes:  Negative for blurred vision and photophobia.  Respiratory:  Negative for cough and sputum production.   Cardiovascular:  Negative for chest pain.  Gastrointestinal:  Negative for diarrhea, nausea and vomiting.  Genitourinary:  Negative for dysuria.  Musculoskeletal:  Positive for back pain. Negative for neck pain.  Skin:  Negative for rash.   Neurological:  Positive for weakness. Negative for focal weakness and headaches.     Past Medical History:  Diagnosis Date   Diskitis 01/17/2021   Family history of adverse reaction to anesthesia    " my mother had a seizure coming out of cabg surgery "   Morbid obesity ([facility])    Morbid obesity with BMI of 50.0-59.9, adult ([facility]) 01/17/2021   Scoliosis     Outpatient Medications Prior to Visit  Medication Sig Dispense Refill   cefadroxil (DURICEF) 500 MG capsule Take 1 capsule (500 mg total) by mouth 2 (two) times daily. 60 capsule 0   Coenzyme Q10 (CO Q 10 PO) Take 1 tablet by mouth daily. gummy     Cyanocobalamin (VITAMIN B-12 PO) Take 1 tablet by mouth daily. gummy     doxycycline (VIBRA-TABS) 100 MG tablet Take 1 tablet (100 mg total) by mouth 2 (two) times daily. 60 tablet 0   furosemide (LASIX) 20 MG tablet Take 1 tablet (20 mg total) by mouth daily. 30 tablet 2   gabapentin (NEURONTIN) 300 MG capsule Take 2 capsules (600 mg total) by mouth 3 (three) times daily. 90 capsule 2   ibuprofen (ADVIL) 200 MG tablet Take 800 mg by mouth every 6 (six) hours as needed for mild pain or headache.     lidocaine (LIDODERM) 5 % Place 1 patch onto the skin daily. Remove & Discard patch within 12 hours or as directed by MD 30 patch 0   methocarbamol (ROBAXIN)  500 MG tablet Take 1 tablet (500 mg total) by mouth every 6 (six) hours as needed for muscle spasms. 60 tablet 0   Multiple Vitamins-Minerals (HAIR SKIN AND NAILS FORMULA PO) Take 1 tablet by mouth daily.     Multiple Vitamins-Minerals (ONE-A-DAY WOMENS PETITES PO) Take 1 tablet by mouth daily.     Omega-3 Fatty Acids (OMEGA-3 FISH OIL PO) Take 1 capsule by mouth daily.     OVER THE COUNTER MEDICATION Take 1 tablet by mouth daily. Power c gummy     Potassium Chloride ER 20 MEQ TBCR Take 1 tablet by mouth daily. 30 tablet 2   albuterol (VENTOLIN HFA) 108 (90 Base) MCG/ACT inhaler Inhale 2 puffs into the lungs every 4 (four) hours as needed for  wheezing or shortness of breath.     No facility-administered medications prior to visit.     Allergies  Allergen Reactions   Okra Hives   Other     Bees and wasps    Social History   Tobacco Use   Smoking status: Never   Smokeless tobacco: Never  Vaping Use   Vaping Use: Never used  Substance Use Topics   Alcohol use: Yes    Comment: rare   Drug use: No    Family History  Problem Relation Age of Onset   Heart failure Mother    Chronic Renal Failure Mother    Diabetes Mother     Objective:   Vitals:   03/24/21 0839 03/24/21 0840  BP:  130/80  Pulse:  (!) 107  Temp:  98.1 F (36.7 C)  SpO2:  94%  Weight: 263 lb (119.3 kg)    Body mass index is 46.59 kg/m.  Physical Exam Vitals reviewed.  Constitutional:      Appearance: She is well-developed.     Comments: Seated comfortably in chair.   HENT:     Mouth/Throat:     Mouth: No oral lesions.     Dentition: Normal dentition. No dental abscesses.     Pharynx: No oropharyngeal exudate.  Cardiovascular:     Rate and Rhythm: Normal rate and regular rhythm.     Heart sounds: Normal heart sounds.  Pulmonary:     Effort: Pulmonary effort is normal.     Breath sounds: Normal breath sounds.  Abdominal:     General: There is no distension.     Palpations: Abdomen is soft.     Tenderness: There is no abdominal tenderness.  Musculoskeletal:     Cervical back: Normal.     Thoracic back: Tenderness (overlying T7-9 spine) present. No edema or deformity.     Lumbar back: Normal.  Lymphadenopathy:     Cervical: No cervical adenopathy.  Skin:    General: Skin is warm and dry.     Findings: No rash.  Neurological:     Mental Status: She is alert and oriented to person, place, and time.     Motor: Weakness present.     Coordination: Coordination normal.     Gait: Gait normal.  Psychiatric:        Judgment: Judgment normal.     Comments: In good spirits today and engaged in care discussion    Lab  Results: Lab Results  Component Value Date   WBC 4.8 02/28/2021   HGB 12.9 02/28/2021   HCT 39.2 02/28/2021   MCV 89.7 02/28/2021   PLT 290 02/28/2021    Lab Results  Component Value Date   CREATININE 1.03 (H) 02/28/2021  BUN 11 02/28/2021   NA 139 02/28/2021   K 3.6 02/28/2021   CL 104 02/28/2021   CO2 25 02/28/2021    Lab Results  Component Value Date   ALT 13 02/28/2021   AST 21 02/28/2021   ALKPHOS 68 02/28/2021   BILITOT 0.4 02/28/2021     Assessment & Plan:   Problem List Items Addressed This Visit       Unprioritized   PICC line infection    ER visit was reviewed.  Blood cultures were negative.  She did have some purulent discharge from the exit site.  With local wound care and ongoing antibiotics this has resolved and healed over nicely.       Osteomyelitis of thoracic vertebra [facility]) - Primary    Ms. Morris-Campbell is here for follow-up after completing her IV therapy for culture-negative acute thoracic spine discitis/osteomyelitis.  She is currently maintained on doxycycline and cefdinir twice daily.  Overall clinically she is improved but still has some residual weakness, vertebral tenderness and radiating pain to the left rib cage with certain mobility.  Pain is well managed on current regimen of over-the-counter Advil and muscle relaxants at night. We talked about repeating her T-spine MRI to follow-up the epidural phlegmon to ensure resolution. We will repeat inflammatory markers today along with CBC.  Last results from 5/16 still indicated elevated CRP. We will check vitamin D as well We talked about her discussing outpatient physical therapy to work on rebuilding strength and mobility. With the vertebral compression fracture may be best to have her follow-up with neurosurgery to ensure no contraindications to physical therapy.  I will call her with results once available.  She will finish out her current antibiotics to complete 10 weeks total  treatment.       Relevant Orders   MR THORACIC SPINE W CONTRAST   CBC w/Diff   Basic metabolic panel   C-reactive protein   Sedimentation rate   Vitamin D (25 hydroxy)   Elevated serum creatinine    Scr up to 1.1 from 0.8 on vancomycin therapy. Vanc troughs 18-22 noted on review of labs. Will repeat today to ensure resolution to normal values.         Rexene Alberts, MSN, NP-C [facility] Pager: [phone] Office: [phone]  03/24/21 it was a talk with 9:31 AM  Vance Gather is here show evidence from

## 2021-03-24 NOTE — Patient Instructions (Signed)
Please continue your two antibiotics until they are gone.   Will have you stop by the lab on your way out to recheck your kidney function after finishing up the vancomycin  I will also look at the inflammation levels with this blood work.   Will work on getting you set up for a repeat MRI to make sure that early fluid collection has been well treated.    I think it would be a great idea for you to talk with your regular doctor about going to some outpatient physical therapy appointments to work on re-building your strength.

## 2021-03-24 NOTE — Assessment & Plan Note (Signed)
Scr up to 1.1 from 0.8 on vancomycin therapy. Vanc troughs 18-22 noted on review of labs. Will repeat today to ensure resolution to normal values.

## 2021-03-24 NOTE — Assessment & Plan Note (Addendum)
Stacey Kaiser is here for follow-up after completing her IV therapy for culture-negative acute thoracic spine discitis/osteomyelitis.  She is currently maintained on doxycycline and cefdinir twice daily.  Overall clinically she is improved but still has some residual weakness, vertebral tenderness and radiating pain to the left rib cage with certain mobility.  Pain is well managed on current regimen of over-the-counter Advil and muscle relaxants at night. We talked about repeating her T-spine MRI to follow-up the epidural phlegmon to ensure resolution. We will repeat inflammatory markers today along with CBC.  Last results from 5/16 still indicated elevated CRP. We will check vitamin D as well We talked about her discussing outpatient physical therapy to work on rebuilding strength and mobility. With the vertebral compression fracture may be best to have her follow-up with neurosurgery to ensure no contraindications to physical therapy.  I will call her with results once available.  She will finish out her current antibiotics to complete 10 weeks total treatment.

## 2021-03-24 NOTE — Assessment & Plan Note (Signed)
ER visit was reviewed.  Blood cultures were negative.  She did have some purulent discharge from the exit site.  With local wound care and ongoing antibiotics this has resolved and healed over nicely.

## 2021-03-25 LAB — CBC WITH DIFFERENTIAL/PLATELET
Absolute Monocytes: 424 cells/uL (ref 200–950)
Basophils Absolute: 160 cells/uL (ref 0–200)
Basophils Relative: 2 %
Eosinophils Absolute: 784 cells/uL — ABNORMAL HIGH (ref 15–500)
Eosinophils Relative: 9.8 %
HCT: 42 % (ref 35.0–45.0)
Hemoglobin: 14.1 g/dL (ref 11.7–15.5)
Lymphs Abs: 1544 cells/uL (ref 850–3900)
MCH: 29.4 pg (ref 27.0–33.0)
MCHC: 33.6 g/dL (ref 32.0–36.0)
MCV: 87.7 fL (ref 80.0–100.0)
MPV: 9.4 fL (ref 7.5–12.5)
Monocytes Relative: 5.3 %
Neutro Abs: 5088 cells/uL (ref 1500–7800)
Neutrophils Relative %: 63.6 %
Platelets: 364 10*3/uL (ref 140–400)
RBC: 4.79 10*6/uL (ref 3.80–5.10)
RDW: 13.4 % (ref 11.0–15.0)
Total Lymphocyte: 19.3 %
WBC: 8 10*3/uL (ref 3.8–10.8)

## 2021-03-25 LAB — BASIC METABOLIC PANEL
BUN: 13 mg/dL (ref 7–25)
CO2: 25 mmol/L (ref 20–32)
Calcium: 9.9 mg/dL (ref 8.6–10.4)
Chloride: 106 mmol/L (ref 98–110)
Creat: 0.93 mg/dL (ref 0.50–1.05)
Glucose, Bld: 102 mg/dL — ABNORMAL HIGH (ref 65–99)
Potassium: 4 mmol/L (ref 3.5–5.3)
Sodium: 141 mmol/L (ref 135–146)

## 2021-03-25 LAB — C-REACTIVE PROTEIN: CRP: 13.9 mg/L — ABNORMAL HIGH (ref <8.0)

## 2021-03-25 LAB — SEDIMENTATION RATE: Sed Rate: 36 mm/h — ABNORMAL HIGH (ref 0–20)

## 2021-03-25 LAB — VITAMIN D 25 HYDROXY (VIT D DEFICIENCY, FRACTURES): Vit D, 25-Hydroxy: 36 ng/mL (ref 30–100)

## 2021-03-28 ENCOUNTER — Telehealth: Payer: Self-pay

## 2021-03-28 NOTE — Telephone Encounter (Signed)
Disregard. Patient needs follow up appointment with a PCP. Called patient to let her know that she needed to schedule a follow up appointment in order for paperwork to be filled out, however, patient did not answer and VM was full.

## 2021-03-28 NOTE — Telephone Encounter (Signed)
FMLA paperwork is placed in [place] box at front desk.

## 2021-03-28 NOTE — Telephone Encounter (Signed)
Received FMLA paperwork from fax today. Placed in PCP box and notified nurse it had been received.

## 2021-03-31 ENCOUNTER — Ambulatory Visit (HOSPITAL_COMMUNITY)
Admission: RE | Admit: 2021-03-31 | Discharge: 2021-03-31 | Disposition: A | Discharge status: Home / Self Care | Payer: Self-pay | Source: Ambulatory Visit | Attending: Infectious Diseases | Admitting: Infectious Diseases

## 2021-03-31 ENCOUNTER — Other Ambulatory Visit: Payer: Self-pay

## 2021-03-31 DIAGNOSIS — M4624 Osteomyelitis of vertebra, thoracic region: Secondary | ICD-10-CM | POA: Insufficient documentation

## 2021-03-31 IMAGING — MR MR THORACIC SPINE WO/W CM
8 of 9 series · 31 of 48 positions shown · IV contrast (10 GADAVIST)
Comparison: MRI thoracic spine [DATE]

CLINICAL DATA: Intraspinal abscess or granuloma follow up T-spine
osteomyelitis and epidural phlegmon

EXAM:
MRI THORACIC WITHOUT AND WITH CONTRAST
TECHNIQUE: Multiplanar and multiecho pulse sequences of the thoracic spine were
obtained without and with intravenous contrast.
CONTRAST:  10mL GADAVIST GADOBUTROL 1 MMOL/ML IV SOLN

[Series 16: T1 · sagittal · 4.0mm · 1.72mm/px · 1 of 5 slices shown (1 of 3)]
[im 1/5]
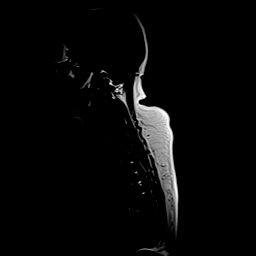

[Series 17: STIR · sagittal · 3.0mm · 1.00mm/px · 2 of 15 slices shown]
[im 1/15]
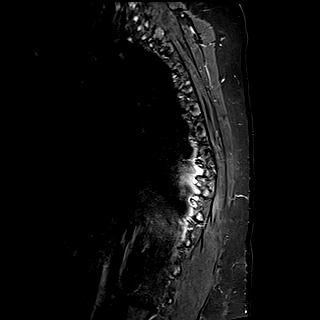
[im 15/15]
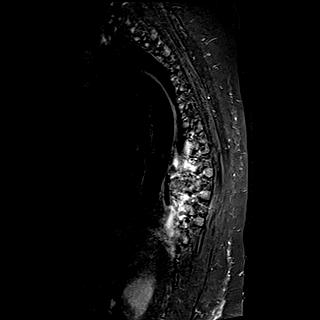

[Series 18: T1 · sagittal · 3.0mm · 1.00mm/px · 3 of 15 slices shown (2 of 3)]
[im 1/15]
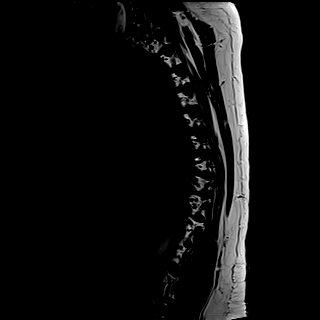
[im 8/15]
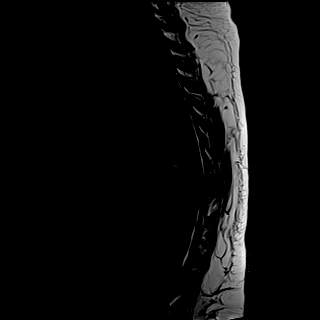
[im 15/15]
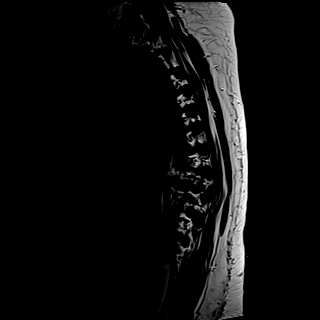

[Series 19: T2 · axial · 4.0mm · 0.78mm/px · z∈[-291,-69]mm · 9 of 50 slices shown]
[im 1/50]
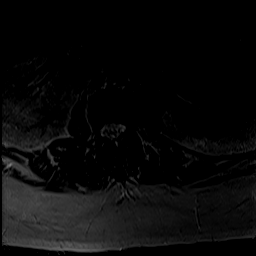
[im 7/50]
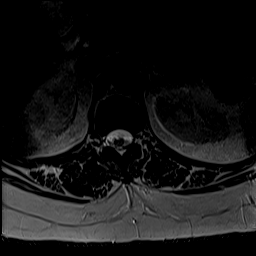
[im 13/50]
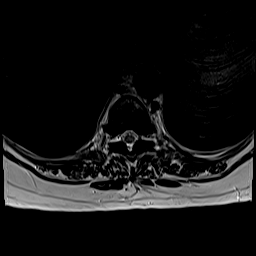
[im 19/50]
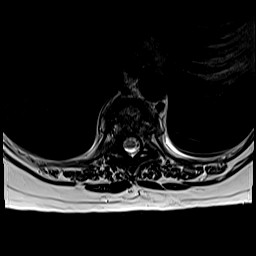
[im 25/50]
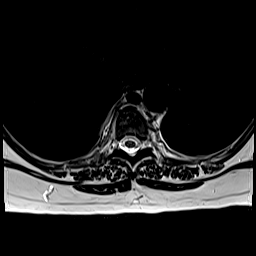
[im 31/50]
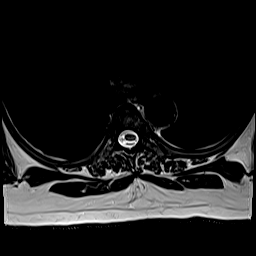
[im 37/50]
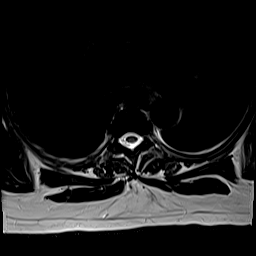
[im 43/50]
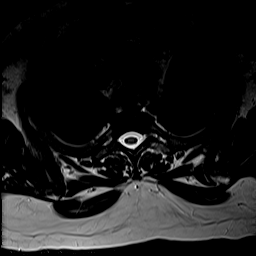
[im 50/50]
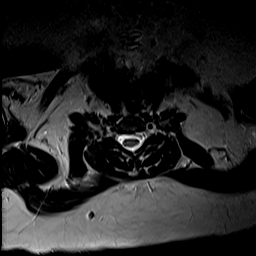

[Series 21: T1 · axial · 4.0mm · 0.39mm/px · z∈[-291,-69]mm · 8 of 50 slices shown (3 of 3)]
[im 1/50]
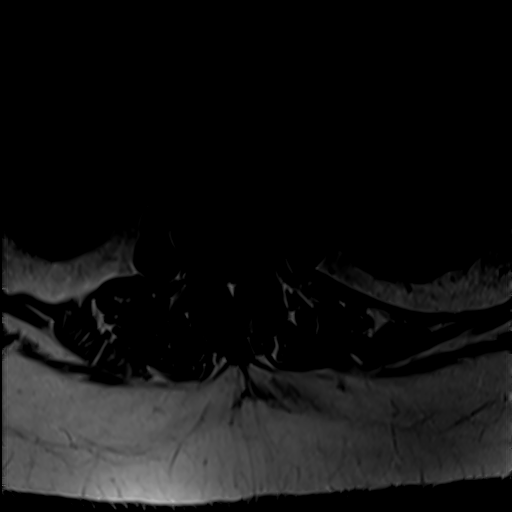
[im 7/50]
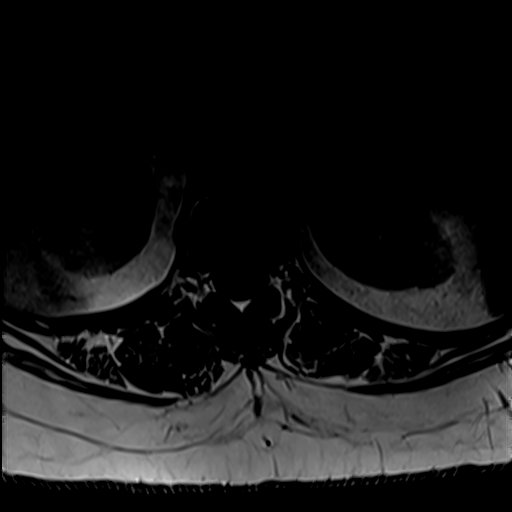
[im 13/50]
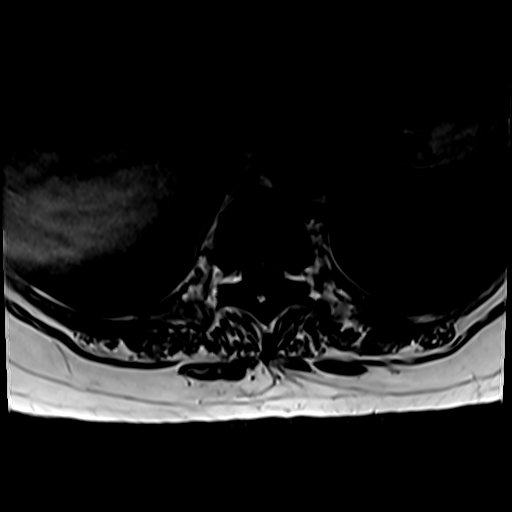
[im 19/50]
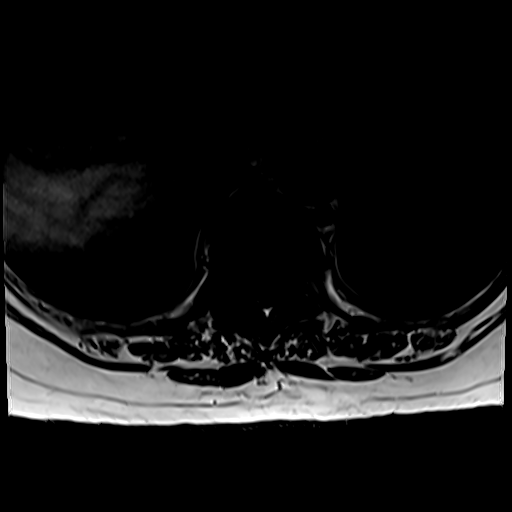
[im 31/50]
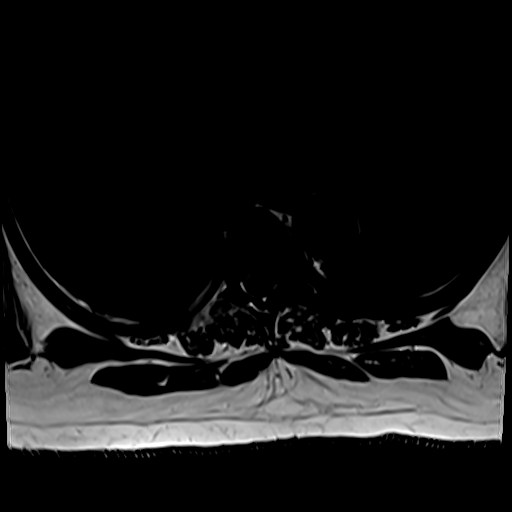
[im 37/50]
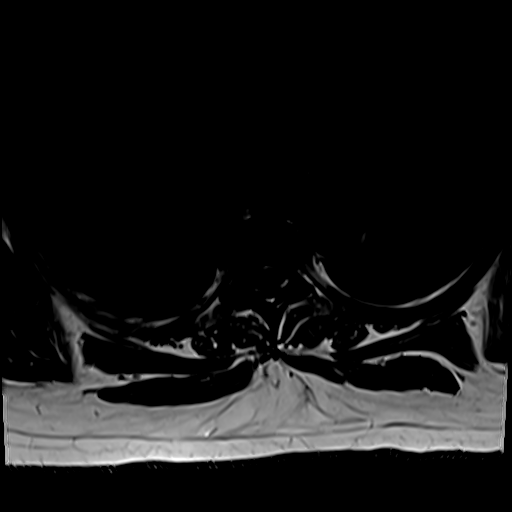
[im 43/50]
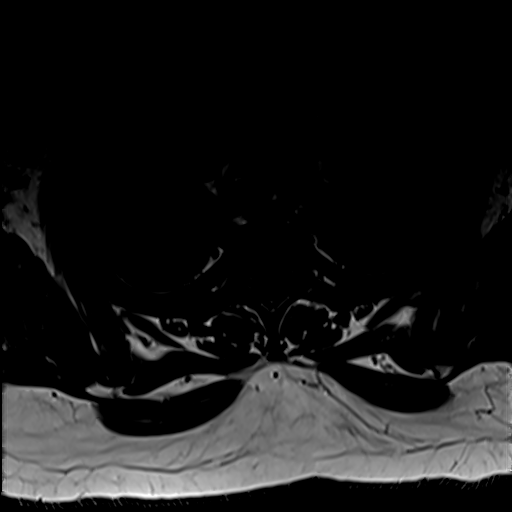
[im 50/50]
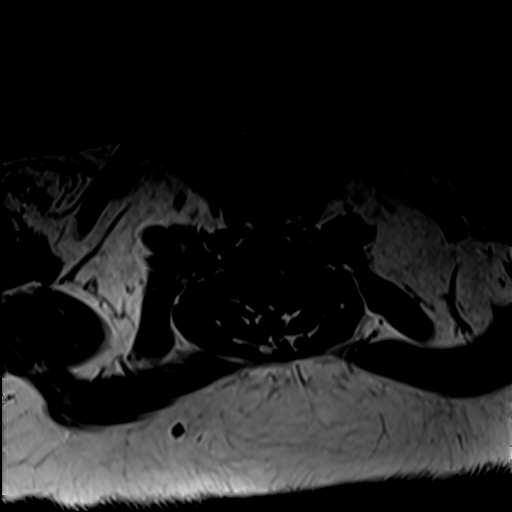

[Series 22: T2 post-contrast · sagittal · 3.0mm · 0.83mm/px · 3 of 15 slices shown]
[im 1/15]
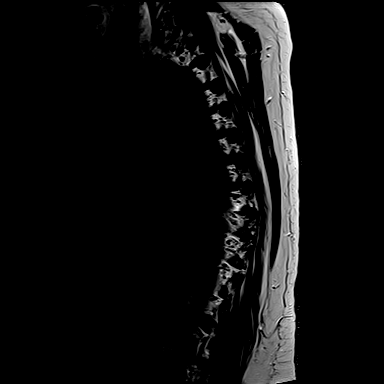
[im 8/15]
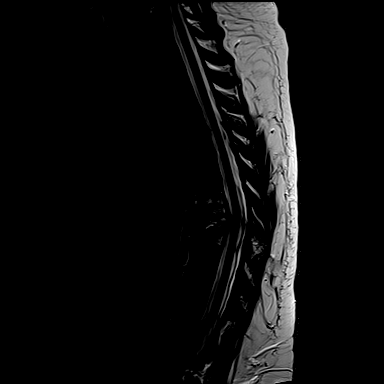
[im 15/15]
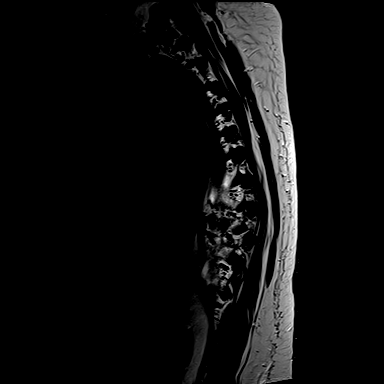

[Series 23: T1 fat-sat post-contrast · sagittal · 3.0mm · 1.00mm/px · 3 of 15 slices shown]
[im 1/15]
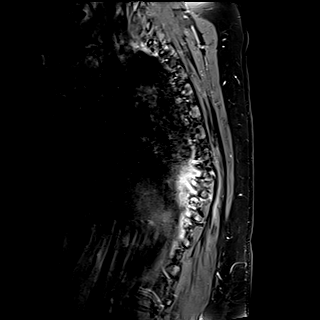
[im 8/15]
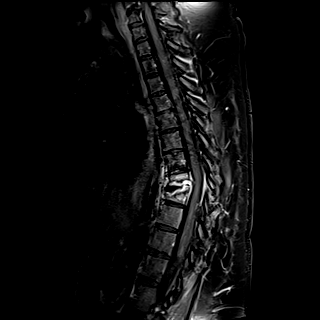
[im 15/15]
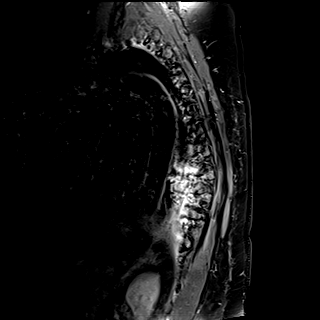

[Series 24: T1 post-contrast · axial · 4.0mm · 0.39mm/px · z∈[-291,-262]mm · 2 of 50 slices shown]
[im 1/50]
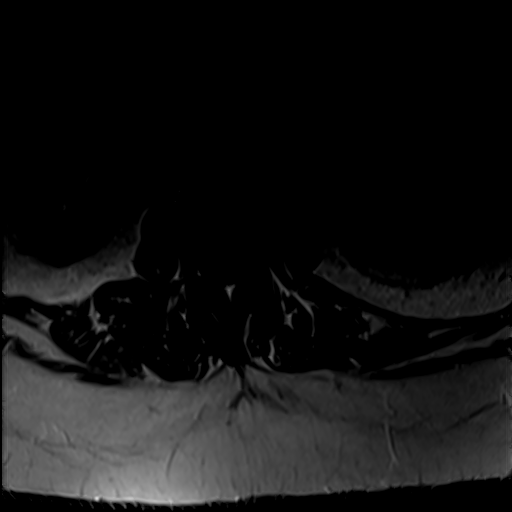
[im 7/50]
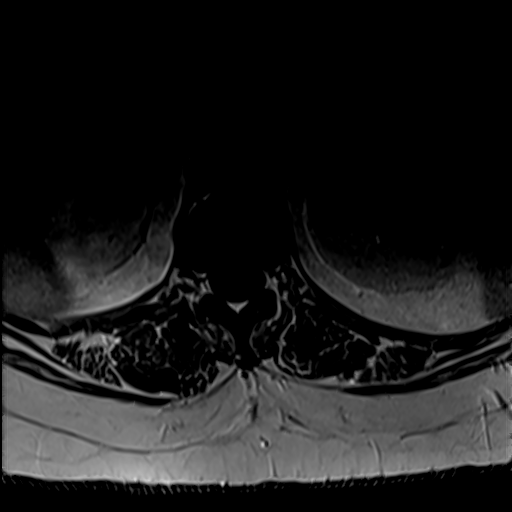

[31 of 48 positions shown; findings below may reference images not displayed]

FINDINGS: Alignment: Unchanged focal kyphosis centered at T8-T9.

Vertebrae: There is persistent edema and enhancement centered at
T8-T9, with endplate irregularity, consistent with discitis
osteomyelitis. There is approximately 50% height loss of T8 and 75%
height loss of T9, similar to prior exam without evidence of
progressive height loss. Unchanged 4 mm bony retropulsion.
Persistent edema and enhancement of the bilateral facets at T8-T9
consistent with septic arthritis. There is thin epidural enhancement
at the T8-T9 level anteriorly and posteriorly compatible with
epidural phlegmon, decreased in prominence in comparison to prior
exam with decreased mass effect on the cord.

Unchanged fatty endplate changes at T7-T8, consistent with Modic
type 2 endplate change, though with a mild amount of TERTULIEN and
enhancement at this level.

Unchanged diffusely low T1 signal throughout the marrow, which is
nonspecific.

Cord: The cord is kinked at the T8-T9 level, without any abnormal
internal cord signal.

Paraspinal and other soft tissues: There is persistent paraspinal
edema and enhancement adjacent at the T8-T9 level, right greater
than left, consistent with infection. There is no well-formed
abscess.

Disc levels: Findings at T8-T9 as described above. There is moderate
spinal canal stenosis and mild cord kinking and flattening of the
cord at this level. Mild degenerative changes elsewhere in the
thoracic spine, without any significant spinal canal or neural
foraminal narrowing.

Other: Trace left pleural effusion.
IMPRESSION: Persistent findings of discitis osteomyelitis and septic facet
arthritis at T8-T9, with paraspinal soft tissue phlegmon. Persistent
but decreased degree of epidural phlegmon resulting in at a lesser
degree of spinal canal narrowing, now mild-moderate.

Unchanged 50% height loss of T8 and 75% height loss of T9. Unchanged
resultant focal kyphosis at T8-T9 with kinking and mild flattening
of the cord. No abnormal cord signal.

Endplate changes at T7-T8 are similar to prior exam, and consistent
with Modic degenerative change with possible superimposed infectious
process given mild edema and enhancement at this level.

## 2021-03-31 MED ORDER — GADOBUTROL 1 MMOL/ML IV SOLN
10.0000 mL | Freq: Once | INTRAVENOUS | Status: AC | PRN
  Administered 2021-03-31: 10 mL via INTRAVENOUS

## 2021-04-05 ENCOUNTER — Encounter: Payer: Self-pay | Admitting: Physician Assistant

## 2021-04-05 MED ORDER — DOXYCYCLINE HYCLATE 100 MG PO TABS: 100.0000 mg | ORAL_TABLET | Freq: Two times a day (BID) | ORAL | 0 refills | Status: DC

## 2021-04-05 MED ORDER — CEFADROXIL 500 MG PO CAPS: 500.0000 mg | ORAL_CAPSULE | Freq: Two times a day (BID) | ORAL | 0 refills | Status: DC

## 2021-04-05 NOTE — Progress Notes (Signed)
MRI results reviewed - no worsening changes to declare and some improvement. May be radiographic lag vs under treated infection but. Disc height loss stable. With elevated inflammatory markers will refill abx x 4w then have her back to see how she is doing with Tammy Sours.

## 2021-04-05 NOTE — Addendum Note (Signed)
Addended by: Blanchard Kelch on: 04/05/2021 08:57 AM   Modules accepted: Orders

## 2021-04-11 ENCOUNTER — Encounter: Payer: Self-pay | Admitting: Critical Care Medicine

## 2021-04-11 ENCOUNTER — Other Ambulatory Visit: Payer: Self-pay

## 2021-04-11 ENCOUNTER — Ambulatory Visit: Payer: MEDICAID | Attending: Critical Care Medicine | Admitting: Critical Care Medicine

## 2021-04-11 VITALS — BP 115/82 | HR 109 | Resp 16 | Wt 266.4 lb

## 2021-04-11 DIAGNOSIS — R635 Abnormal weight gain: Secondary | ICD-10-CM

## 2021-04-11 DIAGNOSIS — Z6841 Body Mass Index (BMI) 40.0 and over, adult: Secondary | ICD-10-CM

## 2021-04-11 DIAGNOSIS — R7989 Other specified abnormal findings of blood chemistry: Secondary | ICD-10-CM

## 2021-04-11 DIAGNOSIS — M4624 Osteomyelitis of vertebra, thoracic region: Secondary | ICD-10-CM

## 2021-04-11 DIAGNOSIS — Z1211 Encounter for screening for malignant neoplasm of colon: Secondary | ICD-10-CM

## 2021-04-11 DIAGNOSIS — E876 Hypokalemia: Secondary | ICD-10-CM

## 2021-04-11 DIAGNOSIS — M4644 Discitis, unspecified, thoracic region: Secondary | ICD-10-CM

## 2021-04-11 MED ORDER — POTASSIUM CHLORIDE ER 20 MEQ PO TBCR: 20.0000 meq | EXTENDED_RELEASE_TABLET | Freq: Every day | ORAL | 2 refills | Status: DC

## 2021-04-11 MED ORDER — FUROSEMIDE 20 MG PO TABS: 20.0000 mg | ORAL_TABLET | Freq: Every day | ORAL | 2 refills | Status: DC

## 2021-04-11 MED ORDER — GABAPENTIN 300 MG PO CAPS: 600.0000 mg | ORAL_CAPSULE | Freq: Two times a day (BID) | ORAL | 6 refills | Status: DC

## 2021-04-11 MED ORDER — METHOCARBAMOL 500 MG PO TABS: 500.0000 mg | ORAL_TABLET | Freq: Four times a day (QID) | ORAL | 4 refills | Status: DC | PRN

## 2021-04-11 NOTE — Assessment & Plan Note (Signed)
Elevated serum creatinine resolved.

## 2021-04-11 NOTE — Progress Notes (Signed)
Established Patient Office Visit  Subjective:  Patient ID: Stacey Kaiser, female    DOB: 1970-11-27  Age: 50 y.o. MRN: [id number]  CC:  Chief Complaint  Patient presents with   Establish Care    HPI Shantasia Hunnell presents for a follow up visit regarding a hospitalization from 01/16/2021 - 01/24/2021 for acute discitis. The patient had a follow up appointment with infectious disease on 03/24/2021. The patient had labs drawn at this visit which revealed and elevated sedimentation rate, elevated CRP, improved BUN and creatinine, and a CBC with normal white blood cell count. The patient states the infectious disease provider ordered a MRI due to the elevated sedimentation rate. The MRI ordered on this date revealed: "There is persistent edema and enhancement centered at T8-T9, with endplate irregularity, consistent with discitis osteomyelitis. There is approximately 50% height loss of T8 and 75% height loss of T9, similar to prior exam without evidence of progressive height loss. Unchanged 4 mm bony retropulsion. Persistent edema and enhancement of the bilateral facets at T8-T9 consistent with septic arthritis. There is thin epidural enhancement at the T8-T9 level anteriorly and posteriorly compatible with epidural phlegmon, decreased in prominence in comparison to prior exam with decreased mass effect on the cord.   Unchanged fatty endplate changes at C1-Y6, consistent with Modic type 2 endplate change, though with a mild amount of edem and enhancement at this level."  Due to the continued presence of evidence of discitis on the MRI, the infectious disease specialist ordered another course of antibiotics. The patient has been taking cefadroxil 500 mg twice daily and doxycycline 100 mg twice daily.   The patient states her symptoms are improving. Her back pain is now intermittent. She only experiences the pain in her thoracic spine when she turns in bed on bends down to  reach for something. She also reports sometime tightness in her paraspinal muscles. She is able to manage the pain with Advil during the day. She takes methocarbamol at night due to the muscle tightness she experiences. The patient's mobility has also been improving. She has been able to get up and walk around the house every hour with her walker. She is also able to take a shower now without assistance. She still reports some tingling in her toes, with the left foot having greater numbness in the right. The patient has not seen neurosurgery since she was discharged from the hospital. He attending Neurosurgeon during hospital stay was Dr Newman Pies  The following is from the patient's discharge summary: From discharge summary: Brief/Interim Summary:   Patient is a 50 year old female with history of morbid obesity with BMI of 57 who presents with severe back pain.  She was seen and treated with different medications for back pain as an outpatient but it did not resolve so she presented to the emergency department.  CT imagings showed discitis and compression fracture at T8.  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  ID/IR/neurosurgery consulted.  Underwent disc biopsy by IR on 01/18/21. Started on IV antibiotics.  Hospital course remarkable for severe back pain with radiation. PT/OT evaluation recommending HH.   Back pain is much better today.  She is medically stable for discharge to home today, she will continue IV antibiotics at home.   Following problems were addressed during her hospitalization:   Discitis/osteomyelitis: Imagings showed discitis/osteomyelitis on T8-T9 with compression fracture of T9 and also presence of epidural phlegmon.  ID/IR/neurosurgery consulted.  Underwent  T9 core biopsies  on 01/18/21 under general anesthesia .  Blood Cultures have been sent ,NGTD.  Surgical pathology did not show evidence of acute  osteomyelitis. Has been started on IV antibiotics now.  ID recommended to continue vancomycin and ceftriaxone for 6 weeks and follow-up as an outpatient. PT recommended home health.  Back pain is much better now.   Abdominal distention: Distended abdomen but denies any abdominal pain.  Abdominal imaging she did not show any intra-abdominal or intrapelvic pathology. Continue bowel regimen for constipation.   Morbid obesity: BMI 57.48.  Weight loss, healthy diet encouraged   Elevated TSH: Mild elevated TSH of 7.7.  Normal free T4 and T3.  Will not start on Synthyroid.   Uninsured: TOC consulted   Hypokalemia: Potassium supplemented   Respiratory insufficiency: .  No history of respiratory disease.  Chest x-ray some cardiomegaly with congestion and small bilateral pleural effusion.   Lungs are clear on auscultation.  BNP  elevated.  Echocardiogram showed normal left ventricular function with ejection fraction of 55 to 17%, normal diastolic parameters, moderately elevated pulmonary artery systolic pressure. Her respiratory insufficiency could be contributed by undiagnosed OSA/obesity hypoventilation syndrome versus pulmonary artery hypertension.  She needs to have a sleep study as an outpatient.  Currently she is saturating fine on room air after aggressive diuresis.  Continue Lasix 20 mg daily at home.   Morbid obesity: BMI 57.4  Past Medical History:  Diagnosis Date   Diskitis 01/17/2021   Elevated serum creatinine 03/24/2021   Family history of adverse reaction to anesthesia    " my mother had a seizure coming out of cabg surgery "   Morbid obesity ([place])    Morbid obesity with BMI of 50.0-59.9, adult ([place]) 01/17/2021   Scoliosis     Past Surgical History:  Procedure Laterality Date   IR FLUORO GUIDED NEEDLE PLC ASPIRATION/INJECTION LOC  01/18/2021   RADIOLOGY WITH ANESTHESIA N/A 01/18/2021   Procedure: IR WITH ANESTHESIA;  Surgeon: Radiologist, Medication, MD;  Location: [facility];  Service:  Radiology;  Laterality: N/A;   WISDOM TOOTH EXTRACTION      Family History  Problem Relation Age of Onset   Heart failure Mother    Chronic Renal Failure Mother    Diabetes Mother     Social History   Socioeconomic History   Marital status: Soil scientist    Spouse name: Not on file   Number of children: 0   Years of education: Not on file   Highest education level: Not on file  Occupational History   Occupation: retail at [employer]  Tobacco Use   Smoking status: Never   Smokeless tobacco: Never  Vaping Use   Vaping Use: Never used  Substance and Sexual Activity   Alcohol use: Yes    Comment: rare   Drug use: No   Sexual activity: Not on file  Other Topics Concern   Not on file  Social History Narrative   Not on file   Social Determinants of Health   Financial Resource Strain: Not on file  Food Insecurity: Not on file  Transportation Needs: Not on file  Physical Activity: Not on file  Stress: Not on file  Social Connections: Not on file  Intimate Partner Violence: Not on file    Outpatient Medications Prior to Visit  Medication Sig Dispense Refill   cefadroxil (DURICEF) 500 MG capsule Take 1 capsule (500 mg total) by mouth 2 (two) times daily. 60 capsule 0  Coenzyme Q10 (CO Q 10 PO) Take 1 tablet by mouth daily. gummy     Cyanocobalamin (VITAMIN B-12 PO) Take 1 tablet by mouth daily. gummy     doxycycline (VIBRA-TABS) 100 MG tablet Take 1 tablet (100 mg total) by mouth 2 (two) times daily. 60 tablet 0   Multiple Vitamins-Minerals (HAIR SKIN AND NAILS FORMULA PO) Take 1 tablet by mouth daily.     Multiple Vitamins-Minerals (ONE-A-DAY WOMENS PETITES PO) Take 1 tablet by mouth daily.     Omega-3 Fatty Acids (OMEGA-3 FISH OIL PO) Take 1 capsule by mouth daily.     OVER THE COUNTER MEDICATION Take 1 tablet by mouth daily. Power c gummy     furosemide (LASIX) 20 MG tablet Take 1 tablet (20 mg total) by mouth daily. 30 tablet 2   gabapentin (NEURONTIN)  300 MG capsule Take 2 capsules (600 mg total) by mouth 3 (three) times daily. (Patient taking differently: Take 600 mg by mouth 2 (two) times daily.) 90 capsule 2   methocarbamol (ROBAXIN) 500 MG tablet Take 1 tablet (500 mg total) by mouth every 6 (six) hours as needed for muscle spasms. 60 tablet 0   Potassium Chloride ER 20 MEQ TBCR Take 1 tablet by mouth daily. 30 tablet 2   albuterol (VENTOLIN HFA) 108 (90 Base) MCG/ACT inhaler Inhale 2 puffs into the lungs every 4 (four) hours as needed for wheezing or shortness of breath. (Patient not taking: Reported on 04/11/2021)     ibuprofen (ADVIL) 200 MG tablet Take 800 mg by mouth every 6 (six) hours as needed for mild pain or headache.     lidocaine (LIDODERM) 5 % Place 1 patch onto the skin daily. Remove & Discard patch within 12 hours or as directed by MD (Patient not taking: Reported on 04/11/2021) 30 patch 0   No facility-administered medications prior to visit.    Allergies  Allergen Reactions   [place]ra Hives   Other     Bees and wasps    ROS Review of Systems  Constitutional:  Negative for chills and fever.  HENT: Negative.  Negative for congestion and rhinorrhea.   Eyes: Negative.   Respiratory:  Negative for cough, shortness of breath and wheezing.   Cardiovascular:  Negative for chest pain.  Gastrointestinal: Negative.  Negative for abdominal pain, diarrhea and vomiting.  Genitourinary: Negative.   Musculoskeletal:  Positive for back pain.  Neurological:  Positive for numbness (in toes bilaterally).  Psychiatric/Behavioral:  Negative for self-injury and suicidal ideas. The patient is nervous/anxious (Feels this is situational due to her recent hospitalization and illness).      Objective:    Physical Exam Vitals and nursing note reviewed.  Constitutional:      General: She is not in acute distress.    Appearance: She is obese.  HENT:     Head: Normocephalic and atraumatic.     Mouth/Throat:     Mouth: Mucous membranes are  moist.     Comments: Moderate inflammation of the gumline Eyes:     Conjunctiva/sclera: Conjunctivae normal.  Cardiovascular:     Rate and Rhythm: Normal rate and regular rhythm.     Heart sounds: Normal heart sounds. No murmur heard.   No friction rub. No gallop.  Pulmonary:     Effort: Pulmonary effort is normal. No respiratory distress.     Breath sounds: Normal breath sounds. No wheezing.  Abdominal:     Palpations: Abdomen is soft.     Tenderness: no abdominal  tenderness  Musculoskeletal:        General: Tenderness (Tenderness to palpation of the thoracic spine) present.     Cervical back: Normal range of motion.  Skin:    General: Skin is warm.  Neurological:     Mental Status: She is alert and oriented to person, place, and time.  Psychiatric:        Mood and Affect: Mood normal.        Behavior: Behavior normal.    BP 115/82   Pulse (!) 109   Resp 16   Wt 266 lb 6.4 oz (120.8 kg)   LMP 03/16/2021 Comment: ncp per pt  SpO2 97%   BMI 47.19 kg/m  Wt Readings from Last 3 Encounters:  04/11/21 266 lb 6.4 oz (120.8 kg)  03/24/21 263 lb (119.3 kg)  02/24/21 293 lb 3.2 oz (133 kg)     Health Maintenance Due  Topic Date Due   Hepatitis C Screening  Never done   TETANUS/TDAP  Never done   PAP SMEAR-Modifier  Never done   COLON CANCER SCREENING ANNUAL FOBT  Never done   MAMMOGRAM  Never done   Zoster Vaccines- Shingrix (1 of 2) Never done   COVID-19 Vaccine (4 - Booster for Pfizer series) 11/24/2020    There are no preventive care reminders to display for this patient.  Lab Results  Component Value Date   TSH 3.660 02/24/2021   Lab Results  Component Value Date   WBC 8.0 03/24/2021   HGB 14.1 03/24/2021   HCT 42.0 03/24/2021   MCV 87.7 03/24/2021   PLT 364 03/24/2021   Lab Results  Component Value Date   NA 141 03/24/2021   K 4.0 03/24/2021   CO2 25 03/24/2021   GLUCOSE 102 (H) 03/24/2021   BUN 13 03/24/2021   CREATININE 0.93 03/24/2021   BILITOT  0.4 02/28/2021   ALKPHOS 68 02/28/2021   AST 21 02/28/2021   ALT 13 02/28/2021   PROT 6.6 02/28/2021   ALBUMIN 3.8 02/28/2021   CALCIUM 9.9 03/24/2021   ANIONGAP 10 02/28/2021   EGFR 56 (L) 02/24/2021   No results found for: CHOL No results found for: HDL No results found for: LDLCALC No results found for: TRIG No results found for: CHOLHDL Lab Results  Component Value Date   HGBA1C 5.7 (H) 01/17/2021      Assessment & Plan:   Problem List Items Addressed This Visit       Musculoskeletal and Integument   Osteomyelitis of thoracic vertebra ([place]) - Primary    Patient is clinically improving with her symptoms though her most recent MRI still shows evidence of inflammation and infection of the thoracic vertebrae.  Source of infection is not clear all C/S data NEG Referred patient to Dr. Arnoldo Morale of neurosurgery. Continue cefadroxil and doxycycline twice daily per infectious disease. Continue gabapentin twice daily.  Continue Robaxin at night as needed.  Continue regular follow up with infectious disease.  Follow up at next appointment.        Relevant Medications   gabapentin (NEURONTIN) 300 MG capsule   methocarbamol (ROBAXIN) 500 MG tablet   Other Relevant Orders   Ambulatory referral to Neurosurgery     Other   BMI 45.0-49.9, adult ([place])    BMI is down to 47 from mid 32s in April   Pt following healthier diet  Counseling issued       RESOLVED: Elevated serum creatinine    Elevated serum creatinine resolved.  Other Visit Diagnoses     Elevated brain natriuretic peptide (BNP) level       Relevant Medications   furosemide (LASIX) 20 MG tablet   Weight gain       Relevant Medications   furosemide (LASIX) 20 MG tablet   Discitis of thoracic region       Relevant Medications   gabapentin (NEURONTIN) 300 MG capsule   methocarbamol (ROBAXIN) 500 MG tablet   Other Relevant Orders   Ambulatory referral to Neurosurgery   Hypokalemia       Relevant  Medications   Potassium Chloride ER 20 MEQ TBCR   Colon cancer screening       Relevant Orders   Fecal occult blood, imunochemical       Meds ordered this encounter  Medications   furosemide (LASIX) 20 MG tablet    Sig: Take 1 tablet (20 mg total) by mouth daily.    Dispense:  30 tablet    Refill:  2   gabapentin (NEURONTIN) 300 MG capsule    Sig: Take 2 capsules (600 mg total) by mouth 2 (two) times daily.    Dispense:  60 capsule    Refill:  6   methocarbamol (ROBAXIN) 500 MG tablet    Sig: Take 1 tablet (500 mg total) by mouth every 6 (six) hours as needed for muscle spasms.    Dispense:  60 tablet    Refill:  4   Potassium Chloride ER 20 MEQ TBCR    Sig: Take 1 tablet by mouth daily.    Dispense:  30 tablet    Refill:  2  Pt instructed to apply for orange/blue card [employer] letter discount. Will do colon cancer screen with FIT test  Follow-up: Return in about 2 months (around 06/11/2021).  39 min spent in chart review and performing exam/ history taking, complex medical decision making  Asencion Noble, MD

## 2021-04-11 NOTE — Assessment & Plan Note (Addendum)
Patient is clinically improving with her symptoms though her most recent MRI still shows evidence of inflammation and infection of the thoracic vertebrae.  Source of infection is not clear all C/S data NEG Referred patient to Dr. Lovell Sheehan of neurosurgery. Continue cefadroxil and doxycycline twice daily per infectious disease. Continue gabapentin twice daily.  Continue Robaxin at night as needed.  Continue regular follow up with infectious disease.  Follow up at next appointment.

## 2021-04-11 NOTE — Patient Instructions (Signed)
No change in medications refills sent to your [facility] pharmacy  A referral to Dr. Tressie Stalker of [facility] will be made for evaluation of your MRIs  A colon cancer FIT screening test was given please process and have your husband bring it back and drop it off to the lab  We will hold off on your other primary care screenings for now  Return to see Dr. Delford Field 2 months

## 2021-04-11 NOTE — Assessment & Plan Note (Signed)
BMI is down to 47 from mid 50s in April   Pt following healthier diet  Counseling issued

## 2021-04-13 ENCOUNTER — Telehealth: Payer: Self-pay

## 2021-04-13 NOTE — Telephone Encounter (Signed)
Per Rexene Alberts, NP, patient's disability paperwork should be completed by her PCP or ortho doctor if she is seeing one. She has a fracture that is an underlying noninfectious contributor to disability and her PCP would be better able to determine needs.   Called patient to make her aware, no answer. Left HIPAA compliant voicemail requesting callback.   Will send MyChart message as well.   Sandie Ano, RN

## 2021-05-03 ENCOUNTER — Ambulatory Visit (INDEPENDENT_AMBULATORY_CARE_PROVIDER_SITE_OTHER): Payer: Self-pay | Admitting: Family

## 2021-05-03 ENCOUNTER — Encounter: Payer: Self-pay | Admitting: Family

## 2021-05-03 ENCOUNTER — Other Ambulatory Visit: Payer: Self-pay

## 2021-05-03 VITALS — BP 128/86 | HR 102 | Temp 98.8°F | Ht <= 58 in | Wt 270.0 lb

## 2021-05-03 DIAGNOSIS — M4624 Osteomyelitis of vertebra, thoracic region: Secondary | ICD-10-CM

## 2021-05-03 MED ORDER — DOXYCYCLINE HYCLATE 100 MG PO TABS: 100.0000 mg | ORAL_TABLET | Freq: Two times a day (BID) | ORAL | 1 refills | Status: DC

## 2021-05-03 MED ORDER — CEFADROXIL 500 MG PO CAPS: 500.0000 mg | ORAL_CAPSULE | Freq: Two times a day (BID) | ORAL | 1 refills | Status: DC

## 2021-05-03 NOTE — Patient Instructions (Addendum)
Nice to see you.  We will check your inflammatory markers today.  Continue to take your doxycycline and cefodroxil.  Refills have been sent to the pharmacy.  Recommend follow with Dr. Lovell Sheehan.  Plan for follow up in 2 months or sooner if needed.   Have a great day and stay safe!

## 2021-05-03 NOTE — Progress Notes (Signed)
Subjective:    Patient ID: Stacey Kaiser, female    DOB: [birth year], 50 y.o.   MRN: [id number]  Chief Complaint  Patient presents with   Follow-up    HPI:  Stacey Kaiser is a 50 y.o. female with thoracic discitis last seen on 03/24/21 by Janene Madeira, NP for follow up and was continued on antibiotics with doxycycline and cefodroxil and inflammatory markers were ESR of 36 and CRP of 13.9. 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 Here today for follow up.   Continues to take doxycycline and cefdinir with some nausea. Pain continues in her midback and is rated at 6/10 in severity. Since leaving the hospital she continues to see improvements in motion. Ambulating with walker. Denies fevers/chills.    Allergies  Allergen Reactions   Okra Hives   Other     Bees and wasps      Outpatient Medications Prior to Visit  Medication Sig Dispense Refill   Coenzyme Q10 (CO Q 10 PO) Take 1 tablet by mouth daily. gummy     Cyanocobalamin (VITAMIN B-12 PO) Take 1 tablet by mouth daily. gummy     furosemide (LASIX) 20 MG tablet Take 1 tablet (20 mg total) by mouth daily. 30 tablet 2   gabapentin (NEURONTIN) 300 MG capsule Take 2 capsules (600 mg total) by mouth 2 (two) times daily. 60 capsule 6   methocarbamol (ROBAXIN) 500 MG tablet Take 1 tablet (500 mg total) by mouth every 6 (six) hours as needed for muscle spasms. 60 tablet 4   Multiple Vitamins-Minerals (HAIR SKIN AND NAILS FORMULA PO) Take 1 tablet by mouth daily.     Multiple Vitamins-Minerals (ONE-A-DAY WOMENS PETITES PO) Take 1 tablet by mouth daily.     Omega-3 Fatty Acids (OMEGA-3 FISH OIL PO)  Take 1 capsule by mouth daily.     OVER THE COUNTER MEDICATION Take 1 tablet by mouth daily. Power c gummy     Potassium Chloride ER 20 MEQ TBCR Take 1 tablet by mouth daily. 30 tablet 2   cefadroxil (DURICEF) 500 MG capsule Take 1 capsule (500 mg total) by mouth 2 (two) times daily. 60 capsule 0   doxycycline (VIBRA-TABS) 100 MG tablet Take 1 tablet (100 mg total) by mouth 2 (two) times daily. 60 tablet 0   albuterol (VENTOLIN HFA) 108 (90 Base) MCG/ACT inhaler Inhale 2 puffs into the lungs every 4 (four) hours as needed for wheezing or shortness of breath. (Patient not taking: Reported on 04/11/2021)     No facility-administered medications prior to visit.     Past Medical History:  Diagnosis Date   Diskitis 01/17/2021   Elevated serum creatinine 03/24/2021   Family history of adverse reaction to anesthesia    " my mother had a seizure coming out of cabg surgery "   Morbid obesity ([place])    Morbid obesity with BMI of 50.0-59.9, adult ([place]) 01/17/2021   Scoliosis      Past Surgical History:  Procedure Laterality Date   IR FLUORO GUIDED NEEDLE PLC ASPIRATION/INJECTION LOC  01/18/2021   RADIOLOGY WITH ANESTHESIA N/A 01/18/2021   Procedure: IR WITH ANESTHESIA;  Surgeon: Radiologist, Medication, MD;  Location: [facility];  Service: Radiology;  Laterality: N/A;   WISDOM TOOTH EXTRACTION  Review of Systems  Constitutional:  Negative for chills, diaphoresis, fatigue and fever.  Respiratory:  Negative for cough, chest tightness, shortness of breath and wheezing.   Cardiovascular:  Negative for chest pain.  Gastrointestinal:  Negative for abdominal pain, diarrhea, nausea and vomiting.  Musculoskeletal:  Positive for back pain.     Objective:    BP 128/86   Pulse (!) 102   Temp 98.8 F (37.1 C) (Oral)   Ht 4' 8"  (1.422 m)   Wt 270 lb (122.5 kg)   SpO2 97%   BMI 60.53 kg/m  Nursing note and vital signs reviewed.  Physical Exam Constitutional:      General: She is not in acute  distress.    Appearance: She is well-developed.     Comments: Seated in the chair; pleasant. Walker in front of her.   Cardiovascular:     Rate and Rhythm: Normal rate and regular rhythm.     Heart sounds: Normal heart sounds.  Pulmonary:     Effort: Pulmonary effort is normal.     Breath sounds: Normal breath sounds.  Skin:    General: Skin is warm and dry.  Neurological:     Mental Status: She is alert and oriented to person, place, and time.  Psychiatric:        Behavior: Behavior normal.        Thought Content: Thought content normal.        Judgment: Judgment normal.     Depression screen [facility] 2/9 05/03/2021 04/11/2021 03/24/2021 02/24/2021  Decreased Interest 0 0 0 0  Down, Depressed, Hopeless 0 1 1 1   PHQ - 2 Score 0 1 1 1   Altered sleeping - 2 - 2  Tired, decreased energy - 1 - 1  Change in appetite - 2 - 0  Feeling bad or failure about yourself  - 2 - 1  Trouble concentrating - 1 - 0  Moving slowly or fidgety/restless - 0 - 2  Suicidal thoughts - 1 - 1  PHQ-9 Score - 10 - 8       Assessment & Plan:    Patient Active Problem List   Diagnosis Date Noted   Osteomyelitis of thoracic vertebra ([place]) 01/17/2021   BMI 45.0-49.9, adult ([place]) 01/17/2021     Problem List Items Addressed This Visit       Musculoskeletal and Integument   Osteomyelitis of thoracic vertebra ([place]) - Primary    Stacey Kaiser continues to have back pain with imaging showing persistent findings of osteomyelitis and paraspinal and epidural phlegmon. Check inflammatory markers today. Given continued presence of phelgmon discussed the need to continue with antibiotic therapy with doxycycline and cefodroxil. Recommend follow up with neurosurgery to determine any role for surgical intervention. Overall moving in a positive direction at this point. Will plan for follow up in 2 months or sooner if needed and consider repeat imaging near that time.        Relevant Medications   doxycycline  (VIBRA-TABS) 100 MG tablet   cefadroxil (DURICEF) 500 MG capsule   Other Relevant Orders   Sedimentation rate   C-reactive protein   Basic Metabolic Panel (BMET)     I am having Stacey Kaiser maintain her albuterol, Multiple Vitamins-Minerals (ONE-A-DAY WOMENS PETITES PO), Coenzyme Q10 (CO Q 10 PO), Cyanocobalamin (VITAMIN B-12 PO), Multiple Vitamins-Minerals (HAIR SKIN AND NAILS FORMULA PO), Omega-3 Fatty Acids (OMEGA-3 FISH OIL PO), OVER THE COUNTER MEDICATION, furosemide, gabapentin, methocarbamol, Potassium Chloride ER, doxycycline, and cefadroxil.  Meds ordered this encounter  Medications   doxycycline (VIBRA-TABS) 100 MG tablet    Sig: Take 1 tablet (100 mg total) by mouth 2 (two) times daily.    Dispense:  60 tablet    Refill:  1    Order Specific Question:   Supervising Provider    Answer:   Baxter Flattery, CYNTHIA [[id number]]   cefadroxil (DURICEF) 500 MG capsule    Sig: Take 1 capsule (500 mg total) by mouth 2 (two) times daily.    Dispense:  60 capsule    Refill:  1    Order Specific Question:   Supervising Provider    Answer:   Carlyle Basques [[id number]]     Follow-up: Return in about 2 months (around 07/04/2021), or if symptoms worsen or fail to improve.   Terri Piedra, MSN, FNP-C Nurse Practitioner [facility] for Infectious Disease [place] number: [phone]

## 2021-05-03 NOTE — Assessment & Plan Note (Signed)
Stacey Kaiser continues to have back pain with imaging showing persistent findings of osteomyelitis and paraspinal and epidural phlegmon. Check inflammatory markers today. Given continued presence of phelgmon discussed the need to continue with antibiotic therapy with doxycycline and cefodroxil. Recommend follow up with neurosurgery to determine any role for surgical intervention. Overall moving in a positive direction at this point. Will plan for follow up in 2 months or sooner if needed and consider repeat imaging near that time.

## 2021-05-04 LAB — BASIC METABOLIC PANEL
BUN/Creatinine Ratio: 21 (calc) (ref 6–22)
BUN: 22 mg/dL (ref 7–25)
CO2: 24 mmol/L (ref 20–32)
Calcium: 9.7 mg/dL (ref 8.6–10.4)
Chloride: 106 mmol/L (ref 98–110)
Creat: 1.07 mg/dL — ABNORMAL HIGH (ref 0.50–1.03)
Glucose, Bld: 109 mg/dL — ABNORMAL HIGH (ref 65–99)
Potassium: 4.6 mmol/L (ref 3.5–5.3)
Sodium: 139 mmol/L (ref 135–146)

## 2021-05-04 LAB — C-REACTIVE PROTEIN: CRP: 8.1 mg/L — ABNORMAL HIGH (ref <8.0)

## 2021-05-04 LAB — SEDIMENTATION RATE: Sed Rate: 25 mm/h — ABNORMAL HIGH (ref 0–20)

## 2021-05-09 ENCOUNTER — Telehealth: Payer: Self-pay | Admitting: Critical Care Medicine

## 2021-05-09 NOTE — Telephone Encounter (Signed)
Got documents for FMLA forms and placed in PCP box

## 2021-05-09 NOTE — Telephone Encounter (Signed)
Copied from CRM [id number]. Topic: General - Call Back - No Documentation >> May 09, 2021 11:03 AM Randol Kern wrote: Reason for CRM: [phone]  Pt called to confirm if a fax was received from [facility] (From [place]) must be faxed back to employer by this Friday. Please advise if fax has been received.

## 2021-05-09 NOTE — Telephone Encounter (Signed)
Paperwork has been received and in PCP box.

## 2021-05-10 NOTE — Telephone Encounter (Signed)
Contacted pt and made aware that FMLA has been faxed and originals has been placed up front for pick up pt doesn't have any questions or concerns

## 2021-05-10 NOTE — Telephone Encounter (Signed)
Complted and given to [place]  Note did this to the best of my ability

## 2021-05-10 NOTE — Telephone Encounter (Signed)
Pt called to see if paperwork was received / I advised her it was put in providers box/ Pt stated it must be turned back in before Friday or she will lose her employment / please advise

## 2021-05-13 ENCOUNTER — Telehealth (INDEPENDENT_AMBULATORY_CARE_PROVIDER_SITE_OTHER): Payer: Self-pay

## 2021-05-13 NOTE — Telephone Encounter (Signed)
Copied from CRM [id number]. Topic: General - Inquiry >> May 06, 2021  2:17 PM Daphine Deutscher D wrote: Reason for CRM: Pt wants to know if the LOA extension has been recd by fax to the office.  CB#  [phone]

## 2021-05-22 NOTE — Progress Notes (Signed)
Cardiology Office Note:    Date:  05/26/2021   ID:  Stacey Kaiser, DOB 21-Feb-1971, MRN [id number]  PCP:  Storm Frisk, MD  Cardiologist:  None  Electrophysiologist:  None   Referring MD: Anders Simmonds, PA-C   Chief Complaint  Patient presents with   Shortness of Breath    History of Present Illness:    Stacey Kaiser is a 50 y.o. female with a hx of morbid obesity who is referred by Georgian Co, PA for evaluation of dyspnea.  Echocardiogram 01/19/2021 showed EF 55 to 60%, normal diastolic function, normal RV function, RVSP 50 mmHg, mild MR. She was admitted in April 2022 with discitis/osteomyelitis and compression fractures in spine.  She continues on oral antibiotics, follows with ID.  She reports she has been having dyspnea with exertion.  States gets short of breath with minimal exertion.  She denies any chest pain.  Denies any lightheadedness, syncope, palpitations, or lower extremity edema.  No smoking history.  Family history includes mother had CABG in 37s and CHF.  Wt Readings from Last 3 Encounters:  05/25/21 271 lb 9.6 oz (123.2 kg)  05/03/21 270 lb (122.5 kg)  04/11/21 266 lb 6.4 oz (120.8 kg)   BP Readings from Last 3 Encounters:  05/25/21 (!) 148/90  05/03/21 128/86  04/11/21 115/82     Past Medical History:  Diagnosis Date   Diskitis 01/17/2021   Elevated serum creatinine 03/24/2021   Family history of adverse reaction to anesthesia    " my mother had a seizure coming out of cabg surgery "   Morbid obesity (HCC)    Morbid obesity with BMI of 50.0-59.9, adult (HCC) 01/17/2021   Scoliosis     Past Surgical History:  Procedure Laterality Date   IR FLUORO GUIDED NEEDLE PLC ASPIRATION/INJECTION LOC  01/18/2021   RADIOLOGY WITH ANESTHESIA N/A 01/18/2021   Procedure: IR WITH ANESTHESIA;  Surgeon: Radiologist, Medication, MD;  Location: [facility];  Service: Radiology;  Laterality: N/A;   WISDOM TOOTH EXTRACTION      Current  Medications: Current Meds  Medication Sig   cefadroxil (DURICEF) 500 MG capsule Take 1 capsule (500 mg total) by mouth 2 (two) times daily.   Coenzyme Q10 (CO Q 10 PO) Take 1 tablet by mouth daily. gummy   Cyanocobalamin (VITAMIN B-12 PO) Take 1 tablet by mouth daily. gummy   doxycycline (VIBRA-TABS) 100 MG tablet Take 1 tablet (100 mg total) by mouth 2 (two) times daily.   furosemide (LASIX) 20 MG tablet Take 1 tablet (20 mg total) by mouth daily.   gabapentin (NEURONTIN) 300 MG capsule Take 2 capsules (600 mg total) by mouth 2 (two) times daily.   ibuprofen (ADVIL) 200 MG tablet Take 200 mg by mouth every 6 (six) hours as needed.   methocarbamol (ROBAXIN) 500 MG tablet Take 1 tablet (500 mg total) by mouth every 6 (six) hours as needed for muscle spasms.   Multiple Vitamins-Minerals (HAIR SKIN AND NAILS FORMULA PO) Take 1 tablet by mouth daily.   Multiple Vitamins-Minerals (ONE-A-DAY WOMENS PETITES PO) Take 1 tablet by mouth daily.   Omega-3 Fatty Acids (OMEGA-3 FISH OIL PO) Take 1 capsule by mouth daily.   OVER THE COUNTER MEDICATION Take 1 tablet by mouth daily. Power c gummy   Potassium Chloride ER 20 MEQ TBCR Take 1 tablet by mouth daily.     Allergies:   Okra and Other   Social History   Socioeconomic History   Marital status: Media planner  Spouse name: Not on file   Number of children: 0   Years of education: Not on file   Highest education level: Not on file  Occupational History   Occupation: retail at [employer]  Tobacco Use   Smoking status: Never   Smokeless tobacco: Never  Vaping Use   Vaping Use: Never used  Substance and Sexual Activity   Alcohol use: Not Currently    Comment: rare   Drug use: No   Sexual activity: Not on file  Other Topics Concern   Not on file  Social History Narrative   Not on file   Social Determinants of Health   Financial Resource Strain: Medium Risk   Difficulty of Paying Living Expenses: Somewhat hard  Food  Insecurity: No Food Insecurity   Worried About Running Out of Food in the Last Year: Never true   Ran Out of Food in the Last Year: Never true  Transportation Needs: No Transportation Needs   Lack of Transportation (Medical): No   Lack of Transportation (Non-Medical): No  Physical Activity: Not on file  Stress: Stress Concern Present   Feeling of Stress : To some extent  Social Connections: Not on file     Family History: The patient's family history includes Chronic Renal Failure in her mother; Diabetes in her mother; Heart failure in her mother.  ROS:   Please see the history of present illness.     All other systems reviewed and are negative.  EKGs/Labs/Other Studies Reviewed:    The following studies were reviewed today:   EKG:  EKG is  ordered today.  The ekg ordered today demonstrates sinus tachycardia, rate 126, left axis deviation, poor R wave progression  Recent Labs: 02/24/2021: BNP 63.7; TSH 3.660 02/28/2021: ALT 13 03/24/2021: Hemoglobin 14.1; Platelets 364 05/03/2021: BUN 22; Creat 1.07; Potassium 4.6; Sodium 139  Recent Lipid Panel No results found for: CHOL, TRIG, HDL, CHOLHDL, VLDL, LDLCALC, LDLDIRECT  Physical Exam:    VS:  BP (!) 148/90   Pulse (!) 126   Resp 20   Ht 4\' 9"  (1.448 m)   Wt 271 lb 9.6 oz (123.2 kg)   LMP 05/25/2021   SpO2 97%   BMI 58.77 kg/m     Wt Readings from Last 3 Encounters:  05/25/21 271 lb 9.6 oz (123.2 kg)  05/03/21 270 lb (122.5 kg)  04/11/21 266 lb 6.4 oz (120.8 kg)     GEN:  Well nourished, well developed in no acute distress HEENT: Normal NECK: No JVD; No carotid bruits LYMPHATICS: No lymphadenopathy CARDIAC: RRR, no murmurs, rubs, gallops RESPIRATORY:  Clear to auscultation without rales, wheezing or rhonchi  ABDOMEN: Soft, non-tender, non-distended MUSCULOSKELETAL:  No edema; No deformity  SKIN: Warm and dry NEUROLOGIC:  Alert and oriented x 3 PSYCHIATRIC:  Normal affect   ASSESSMENT:    1. Shortness of  breath   2. Class 3 severe obesity with body mass index (BMI) of 50.0 to 59.9 in adult, unspecified obesity type, unspecified whether serious comorbidity present (HCC)   3. Elevated BP without diagnosis of hypertension   4. Snoring    PLAN:    Dyspnea: Suspect due to obesity/deconditioning.  Echocardiogram in April 2022 showed normal systolic/diastolic function.  Elevated pulmonary pressures on echo, suspect untreated OSA/OHS.  Discussed obtaining sleep study but patient is self-pay and would prefer to hold off at this time.  Will ask our social worker to reach out to the patient about available resources.  Will refer  to healthy weight and wellness.  If if dyspnea does not improve with weight loss, would recommend further work-up including ischemia evaluation.  Morbid obesity: Body mass index is 58.77 kg/m.  Refer to healthy weight and wellness.  Elevated BP: BP 148/90 in clinic today, does not have history of hypertension and has been normotensive at prior clinic visits.  Recommend checking home BP twice daily for next week and calling with results  Snoring: Suspect OSA.  Recommend sleep study but patient would like to hold off at this time as she is self-pay  RTC in 6 months  Medication Adjustments/Labs and Tests Ordered: Current medicines are reviewed at length with the patient today.  Concerns regarding medicines are outlined above.  Orders Placed This Encounter  Procedures   Referral to HRT/VAS Care Navigation   Ambulatory referral to [facility]   EKG 12-Lead   No orders of the defined types were placed in this encounter.   Patient Instructions  Medication Instructions:  Your physician recommends that you continue on your current medications as directed. Please refer to the Current Medication list given to you today.  Follow-Up: At [facility], you and your health needs are our priority.  As part of our continuing mission to provide you with exceptional heart care, we  have created designated Provider Care Teams.  These Care Teams include your primary Cardiologist (physician) and Advanced Practice Providers (APPs -  Physician Assistants and Nurse Practitioners) who all work together to provide you with the care you need, when you need it.  We recommend signing up for the patient portal called "MyChart".  Sign up information is provided on this After Visit Summary.  MyChart is used to connect with patients for Virtual Visits (Telemedicine).  Patients are able to view lab/test results, encounter notes, upcoming appointments, etc.  Non-urgent messages can be sent to your provider as well.   To learn more about what you can do with MyChart, go to [email or web address].    Your next appointment:   6 month(s)  The format for your next appointment:   In Person  Provider:   Epifanio Lesches, MD   Other Instructions You have been referred to [facility]    Signed, Little Ishikawa, MD  05/26/2021 6:27 AM    [facility]

## 2021-05-25 ENCOUNTER — Encounter: Payer: Self-pay | Admitting: Cardiology

## 2021-05-25 ENCOUNTER — Ambulatory Visit (INDEPENDENT_AMBULATORY_CARE_PROVIDER_SITE_OTHER): Payer: Self-pay | Admitting: Cardiology

## 2021-05-25 ENCOUNTER — Other Ambulatory Visit: Payer: Self-pay

## 2021-05-25 VITALS — BP 148/90 | HR 126 | Resp 20 | Ht <= 58 in | Wt 271.6 lb

## 2021-05-25 DIAGNOSIS — R030 Elevated blood-pressure reading, without diagnosis of hypertension: Secondary | ICD-10-CM

## 2021-05-25 DIAGNOSIS — Z6841 Body Mass Index (BMI) 40.0 and over, adult: Secondary | ICD-10-CM

## 2021-05-25 DIAGNOSIS — R0602 Shortness of breath: Secondary | ICD-10-CM

## 2021-05-25 DIAGNOSIS — R0683 Snoring: Secondary | ICD-10-CM

## 2021-05-25 NOTE — Patient Instructions (Signed)
Medication Instructions:  Your physician recommends that you continue on your current medications as directed. Please refer to the Current Medication list given to you today.  Follow-Up: At [facility], you and your health needs are our priority.  As part of our continuing mission to provide you with exceptional heart care, we have created designated Provider Care Teams.  These Care Teams include your primary Cardiologist (physician) and Advanced Practice Providers (APPs -  Physician Assistants and Nurse Practitioners) who all work together to provide you with the care you need, when you need it.  We recommend signing up for the patient portal called "MyChart".  Sign up information is provided on this After Visit Summary.  MyChart is used to connect with patients for Virtual Visits (Telemedicine).  Patients are able to view lab/test results, encounter notes, upcoming appointments, etc.  Non-urgent messages can be sent to your provider as well.   To learn more about what you can do with MyChart, go to [email or web address].    Your next appointment:   6 month(s)  The format for your next appointment:   In Person  Provider:   Epifanio Lesches, MD   Other Instructions You have been referred to [facility]

## 2021-05-25 NOTE — Progress Notes (Signed)
Heart and Vascular Care Navigation  05/25/2021  Stacey Kaiser 22-Nov-1970 [id number]  Reason for Referral:  Engaged with patient face to face for initial visit for Heart and Vascular Care Coordination.                                                                                                   Assessment:         Pt referred to LCSW as she unfortunately lost her job this morning and is currently uninsured. Met with pt and her significant other Stacey Kaiser. 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 She enjoyed the people she encountered at work, but had been on Short Term then Walker. She sees [name] for PCP needs. I encouraged her to go in person (confirmed she has transportation) or call PCP office to check on eligibility for Medicaid and SNAP. We would need Medicaid denial in order to proceed with [name] Financial Assistance and [name]. Confirmed pt and significant other have food in the home, are okay with housing at this time (they recently moved into a new place), and were provided my card for any additional questions or concerns.                              HRT/VAS Care Coordination     Patients Home Cardiology Office [place] Team Social Worker   Social Worker Name: [name], [facility] [phone]   Living arrangements for the past 2 months Apartment   Lives with: Significant Other   Patient Current Insurance Coverage Self-Pay   Patient Has Concern With Paying Medical Bills Yes   Patient Concerns With Medical Bills ongoing medical work up   Medical Bill Referrals: provided information about Medicaid and CAFA   Does Patient Have Prescription Coverage? No   Home Assistive Devices/Equipment [place] (specify type)   DME Agency [employer]   [facility] Agency Other - See comment       Social History:                                                                              SDOH Screenings   Alcohol Screen: Not on file  Depression (PHQ2-9): Low Risk    PHQ-2 Score: 0  Financial Resource Strain: Medium Risk   Difficulty of Paying Living Expenses: Somewhat hard  Food Insecurity: No Food Insecurity   Worried About Charity fundraiser in the Last Year: Never true   Ran Out of Food in the Last Year: Never true  Housing: Low Risk    Last Housing Risk Score: 0  Physical Activity: Not on file  Social Connections: Not on file  Stress: Stress Concern  Present   Feeling of Stress : To some extent  Tobacco Use: Low Risk    Smoking Tobacco Use: Never   Smokeless Tobacco Use: Never  Transportation Needs: No Transportation Needs   Lack of Transportation (Medical): No   Lack of Transportation (Non-Medical): No    SDOH Interventions: Financial Resources:  Financial Strain Interventions: Other (Comment) (provided CAFA form, my card- will f/u further for needs) DSS for financial assistance  Food Insecurity:  Food Insecurity Interventions: Other (Comment) (referred to DSS for SNAP application)  Housing Insecurity:  Housing Interventions: Intervention Not Indicated  Transportation:   Transportation Interventions: Intervention Not Indicated    Follow-up plan:   Once pt has been screened for Medicaid she was encouraged to f/u with me and let me know determination. At that time I can assist with [facility] and [name]. If I do not hear from pt I received permission to f/u with her in two weeks. Acknowledged difficulty of losing employment and also encouraged her to check in if needed for any emotional support.

## 2021-06-08 ENCOUNTER — Telehealth (HOSPITAL_COMMUNITY): Payer: Self-pay | Admitting: Licensed Clinical Social Worker

## 2021-06-08 NOTE — Telephone Encounter (Signed)
CSW referred to assist patient with obtaining a BP cuff. CSW contacted patient to inform cuff will be delivered to home. Patient grateful for support and assistance. CSW available as needed. Jackie Lisl Slingerland, LCSW, CCSW-MCS [phone]  

## 2021-06-10 ENCOUNTER — Telehealth: Payer: Self-pay | Admitting: Licensed Clinical Social Worker

## 2021-06-10 NOTE — Telephone Encounter (Signed)
LCSW attempted to f/u on status of pt Medicaid (needs to be evaluated for that prior to submitting financial aid applications/[place]). No answer at [phone], message left for return call. Remain available for pt questions/concerns moving forward.   Octavio Graves, MSW, LCSW [facility] Heart/Vascular Care Navigation  [phone]

## 2021-06-12 NOTE — Progress Notes (Signed)
Established Patient Office Visit  Subjective:  Patient ID: Stacey Kaiser, female    DOB: July 10, 1971  Age: 50 y.o. MRN: [id number]  CC:  F/u thoracic diskitis   HPI 04/11/21 Runette Scifres presents for a follow up visit regarding a hospitalization from 01/16/2021 - 01/24/2021 for acute discitis. The patient had a follow up appointment with infectious disease on 03/24/2021. The patient had labs drawn at this visit which revealed and elevated sedimentation rate, elevated CRP, improved BUN and creatinine, and a CBC with normal white blood cell count. The patient states the infectious disease provider ordered a MRI due to the elevated sedimentation rate. The MRI ordered on this date revealed: "There is persistent edema and enhancement centered at T8-T9, with endplate irregularity, consistent with discitis osteomyelitis. There is approximately 50% height loss of T8 and 75% height loss of T9, similar to prior exam without evidence of progressive height loss. Unchanged 4 mm bony retropulsion. Persistent edema and enhancement of the bilateral facets at T8-T9 consistent with septic arthritis. There is thin epidural enhancement at the T8-T9 level anteriorly and posteriorly compatible with epidural phlegmon, decreased in prominence in comparison to prior exam with decreased mass effect on the cord.   Unchanged fatty endplate changes at B1-D1, consistent with Modic type 2 endplate change, though with a mild amount of edem and enhancement at this level."  Due to the continued presence of evidence of discitis on the MRI, the infectious disease specialist ordered another course of antibiotics. The patient has been taking cefadroxil 500 mg twice daily and doxycycline 100 mg twice daily.   The patient states her symptoms are improving. Her back pain is now intermittent. She only experiences the pain in her thoracic spine when she turns in bed on bends down to reach for something. She also  reports sometime tightness in her paraspinal muscles. She is able to manage the pain with Advil during the day. She takes methocarbamol at night due to the muscle tightness she experiences. The patient's mobility has also been improving. She has been able to get up and walk around the house every hour with her walker. She is also able to take a shower now without assistance. She still reports some tingling in her toes, with the left foot having greater numbness in the right. The patient has not seen neurosurgery since she was discharged from the hospital. He attending Neurosurgeon during hospital stay was Dr Newman Pies  The following is from the patient's discharge summary: From discharge summary: Brief/Interim Summary:   Patient is a 50 year old female with history of morbid obesity with BMI of 57 who presents with severe back pain.  She was seen and treated with different medications for back pain as an outpatient but it did not resolve so she presented to the emergency department.  CT imagings showed discitis and compression fracture at T8.  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  ID/IR/neurosurgery consulted.  Underwent disc biopsy by IR on 01/18/21. Started on IV antibiotics.  Hospital course remarkable for severe back pain with radiation. PT/OT evaluation recommending HH.   Back pain is much better today.  She is medically stable for discharge to home today, she will continue IV antibiotics at home.   Following problems were addressed during her hospitalization:   Discitis/osteomyelitis: Imagings showed discitis/osteomyelitis on T8-T9 with compression fracture of T9 and also presence of epidural phlegmon.  ID/IR/neurosurgery consulted.  Underwent  T9 core biopsies  on  01/18/21 under general anesthesia .  Blood Cultures have been sent ,NGTD.  Surgical pathology did not show evidence of acute osteomyelitis. Has been  started on IV antibiotics now.  ID recommended to continue vancomycin and ceftriaxone for 6 weeks and follow-up as an outpatient. PT recommended home health.  Back pain is much better now.   Abdominal distention: Distended abdomen but denies any abdominal pain.  Abdominal imaging she did not show any intra-abdominal or intrapelvic pathology. Continue bowel regimen for constipation.   Morbid obesity: BMI 57.48.  Weight loss, healthy diet encouraged   Elevated TSH: Mild elevated TSH of 7.7.  Normal free T4 and T3.  Will not start on Synthyroid.   Uninsured: TOC consulted   Hypokalemia: Potassium supplemented   Respiratory insufficiency: .  No history of respiratory disease.  Chest x-ray some cardiomegaly with congestion and small bilateral pleural effusion.   Lungs are clear on auscultation.  BNP  elevated.  Echocardiogram showed normal left ventricular function with ejection fraction of 55 to 67%, normal diastolic parameters, moderately elevated pulmonary artery systolic pressure. Her respiratory insufficiency could be contributed by undiagnosed OSA/obesity hypoventilation syndrome versus pulmonary artery hypertension.  She needs to have a sleep study as an outpatient.  Currently she is saturating fine on room air after aggressive diuresis.  Continue Lasix 20 mg daily at home.   Morbid obesity: BMI 57.4  8/29 Patient is seen in return follow-up and doing reasonably well.  She is not short of breath.  Back pain has been manageable.  She is yet to process her fecal occult cancer screening.  She needs a Pap smear and a mammogram.  She also needs hepatitis C screening and flu vaccine.  Also needs tetanus vaccine.  She has been seen by infectious disease and cardiology and they are monitoring her on antibiotics for thoracic spine infection  Her C-reactive protein and sed rate did go down in July  She maintains a gabapentin and Robaxin for her back pain.  Neurosurgery states they did not get any  need to see her.  They want to wait till after antibiotics are concluded with infectious disease.   Past Medical History:  Diagnosis Date   Diskitis 01/17/2021   Elevated serum creatinine 03/24/2021   Family history of adverse reaction to anesthesia    " my mother had a seizure coming out of cabg surgery "   Morbid obesity ([place])    Morbid obesity with BMI of 50.0-59.9, adult ([place]) 01/17/2021   Scoliosis     Past Surgical History:  Procedure Laterality Date   IR FLUORO GUIDED NEEDLE PLC ASPIRATION/INJECTION LOC  01/18/2021   RADIOLOGY WITH ANESTHESIA N/A 01/18/2021   Procedure: IR WITH ANESTHESIA;  Surgeon: Radiologist, Medication, MD;  Location: [facility];  Service: Radiology;  Laterality: N/A;   WISDOM TOOTH EXTRACTION      Family History  Problem Relation Age of Onset   Heart failure Mother    Chronic Renal Failure Mother    Diabetes Mother     Social History   Socioeconomic History   Marital status: Soil scientist    Spouse name: Not on file   Number of children: 0   Years of education: Not on file   Highest education level: Not on file  Occupational History   Occupation: retail at [employer]  Tobacco Use   Smoking status: Never   Smokeless tobacco: Never  Vaping Use   Vaping Use: Never used  Substance and Sexual Activity  Alcohol use: Not Currently    Comment: rare   Drug use: No   Sexual activity: Not on file  Other Topics Concern   Not on file  Social History Narrative   Not on file   Social Determinants of Health   Financial Resource Strain: Medium Risk   Difficulty of Paying Living Expenses: Somewhat hard  Food Insecurity: No Food Insecurity   Worried About Running Out of Food in the Last Year: Never true   Ran Out of Food in the Last Year: Never true  Transportation Needs: No Transportation Needs   Lack of Transportation (Medical): No   Lack of Transportation (Non-Medical): No  Physical Activity: Not on file  Stress: Stress Concern Present    Feeling of Stress : To some extent  Social Connections: Not on file  Intimate Partner Violence: Not on file    Outpatient Medications Prior to Visit  Medication Sig Dispense Refill   albuterol (VENTOLIN HFA) 108 (90 Base) MCG/ACT inhaler Inhale 2 puffs into the lungs every 4 (four) hours as needed for wheezing or shortness of breath.     cefadroxil (DURICEF) 500 MG capsule Take 1 capsule (500 mg total) by mouth 2 (two) times daily. 60 capsule 1   Coenzyme Q10 (CO Q 10 PO) Take 1 tablet by mouth daily. gummy     Cyanocobalamin (VITAMIN B-12 PO) Take 1 tablet by mouth daily. gummy     doxycycline (VIBRA-TABS) 100 MG tablet Take 1 tablet (100 mg total) by mouth 2 (two) times daily. 60 tablet 1   furosemide (LASIX) 20 MG tablet Take 1 tablet (20 mg total) by mouth daily. 30 tablet 2   gabapentin (NEURONTIN) 300 MG capsule Take 2 capsules (600 mg total) by mouth 2 (two) times daily. 60 capsule 6   ibuprofen (ADVIL) 200 MG tablet Take 200 mg by mouth every 6 (six) hours as needed.     methocarbamol (ROBAXIN) 500 MG tablet Take 1 tablet (500 mg total) by mouth every 6 (six) hours as needed for muscle spasms. 60 tablet 4   Multiple Vitamins-Minerals (HAIR SKIN AND NAILS FORMULA PO) Take 1 tablet by mouth daily.     Multiple Vitamins-Minerals (ONE-A-DAY WOMENS PETITES PO) Take 1 tablet by mouth daily.     Omega-3 Fatty Acids (OMEGA-3 FISH OIL PO) Take 1 capsule by mouth daily.     OVER THE COUNTER MEDICATION Take 1 tablet by mouth daily. Power c gummy     Potassium Chloride ER 20 MEQ TBCR Take 1 tablet by mouth daily. 30 tablet 2   No facility-administered medications prior to visit.    Allergies  Allergen Reactions   Okra Hives   Other     Bees and wasps    ROS Review of Systems  Constitutional:  Negative for chills and fever.  HENT: Negative.  Negative for congestion and rhinorrhea.   Eyes: Negative.   Respiratory:  Negative for cough, shortness of breath and wheezing.    Cardiovascular:  Negative for chest pain.  Gastrointestinal: Negative.  Negative for abdominal pain, diarrhea and vomiting.  Genitourinary: Negative.   Musculoskeletal:  Positive for back pain.  Neurological:  Positive for numbness (in toes bilaterally).  Psychiatric/Behavioral:  Negative for self-injury and suicidal ideas. The patient is nervous/anxious.      Objective:    Physical Exam Vitals and nursing note reviewed.  Constitutional:      General: She is not in acute distress.    Appearance: She is obese.  HENT:  Head: Normocephalic and atraumatic.     Mouth/Throat:     Mouth: Mucous membranes are moist.     Comments: Moderate inflammation of the gumline Eyes:     Conjunctiva/sclera: Conjunctivae normal.  Cardiovascular:     Rate and Rhythm: Normal rate and regular rhythm.     Heart sounds: Normal heart sounds. No murmur heard.   No friction rub. No gallop.  Pulmonary:     Effort: Pulmonary effort is normal. No respiratory distress.     Breath sounds: Normal breath sounds. No wheezing.  Abdominal:     Palpations: Abdomen is soft.     Tenderness: There is no abdominal tenderness.  Musculoskeletal:        General: Tenderness (Tenderness to palpation of the thoracic spine) present.     Cervical back: Normal range of motion.  Skin:    General: Skin is warm.  Neurological:     Mental Status: She is alert and oriented to person, place, and time.  Psychiatric:        Mood and Affect: Mood normal.        Behavior: Behavior normal.    BP 124/84   Pulse 83   Resp 16   Wt 278 lb 9.6 oz (126.4 kg)   LMP 05/25/2021   SpO2 98%   BMI 60.29 kg/m  Wt Readings from Last 3 Encounters:  06/13/21 278 lb 9.6 oz (126.4 kg)  05/25/21 271 lb 9.6 oz (123.2 kg)  05/03/21 270 lb (122.5 kg)     Health Maintenance Due  Topic Date Due   Hepatitis C Screening  Never done   TETANUS/TDAP  Never done   PAP SMEAR-Modifier  Never done   COLON CANCER SCREENING ANNUAL FOBT  Never  done   MAMMOGRAM  Never done   Zoster Vaccines- Shingrix (1 of 2) Never done   COVID-19 Vaccine (4 - Booster for Pfizer series) 11/24/2020   INFLUENZA VACCINE  05/16/2021    There are no preventive care reminders to display for this patient.  Lab Results  Component Value Date   TSH 3.660 02/24/2021   Lab Results  Component Value Date   WBC 8.0 03/24/2021   HGB 14.1 03/24/2021   HCT 42.0 03/24/2021   MCV 87.7 03/24/2021   PLT 364 03/24/2021   Lab Results  Component Value Date   NA 139 05/03/2021   K 4.6 05/03/2021   CO2 24 05/03/2021   GLUCOSE 109 (H) 05/03/2021   BUN 22 05/03/2021   CREATININE 1.07 (H) 05/03/2021   BILITOT 0.4 02/28/2021   ALKPHOS 68 02/28/2021   AST 21 02/28/2021   ALT 13 02/28/2021   PROT 6.6 02/28/2021   ALBUMIN 3.8 02/28/2021   CALCIUM 9.7 05/03/2021   ANIONGAP 10 02/28/2021   EGFR 56 (L) 02/24/2021   No results found for: CHOL No results found for: HDL No results found for: LDLCALC No results found for: TRIG No results found for: CHOLHDL Lab Results  Component Value Date   HGBA1C 5.7 (H) 01/17/2021      Assessment & Plan:   Problem List Items Addressed This Visit       Musculoskeletal and Integument   Osteomyelitis of thoracic vertebra ([place]) - Primary    Improving on current antibiotic program  Follow-up per infectious disease        Other   BMI 45.0-49.9, adult [facility])    Cardiology referred patient nutritionist we also discussed diet changes today      Other Visit Diagnoses  Need for hepatitis C screening test       Relevant Orders   HCV Ab w Reflex to Quant PCR      No orders of the defined types were placed in this encounter. Patient to process her colon cancer screening I also gave her the number of the cancer control program for free mammogram and Pap smear  Patient received hepatitis C assay today for screening and will receive flu vaccine today Follow-up: Return in about 3 months (around 09/13/2021).    Asencion Noble, MD

## 2021-06-13 ENCOUNTER — Other Ambulatory Visit: Payer: Self-pay

## 2021-06-13 ENCOUNTER — Encounter: Payer: Self-pay | Admitting: Critical Care Medicine

## 2021-06-13 ENCOUNTER — Ambulatory Visit: Payer: Self-pay | Attending: Critical Care Medicine | Admitting: Critical Care Medicine

## 2021-06-13 VITALS — BP 124/84 | HR 83 | Resp 16 | Wt 278.6 lb

## 2021-06-13 DIAGNOSIS — Z6841 Body Mass Index (BMI) 40.0 and over, adult: Secondary | ICD-10-CM

## 2021-06-13 DIAGNOSIS — Z23 Encounter for immunization: Secondary | ICD-10-CM

## 2021-06-13 DIAGNOSIS — M4624 Osteomyelitis of vertebra, thoracic region: Secondary | ICD-10-CM

## 2021-06-13 DIAGNOSIS — Z1159 Encounter for screening for other viral diseases: Secondary | ICD-10-CM

## 2021-06-13 NOTE — Assessment & Plan Note (Signed)
Improving on current antibiotic program  Follow-up per infectious disease

## 2021-06-13 NOTE — Patient Instructions (Addendum)
Call the cancer control program to apply for free mammogram and pap smear, they will order the studies once approved The number is:   [phone]  Lab today includes hepatitis C screen  Please get your colon cancer screening kit processed  We discussed diet today  You can resume glucosamine/chondroitin supplement  Continue your antibiotics  Flu vaccine was given  Return to see Dr. Joya Gaskins 3 months

## 2021-06-13 NOTE — Assessment & Plan Note (Signed)
Cardiology referred patient nutritionist we also discussed diet changes today

## 2021-06-14 LAB — HCV AB W REFLEX TO QUANT PCR: HCV Ab: <0.1 s/co ratio (ref 0.0–0.9)

## 2021-06-14 LAB — HCV INTERPRETATION

## 2021-06-17 ENCOUNTER — Telehealth: Payer: Self-pay | Admitting: Licensed Clinical Social Worker

## 2021-06-17 NOTE — Telephone Encounter (Signed)
Additional f/u call placed to pt regarding assistance paperwork provided to her during in office appointment. No answer at [phone], message left.   Octavio Graves, MSW, LCSW [facility] Heart/Vascular Care Navigation  [phone]

## 2021-07-13 ENCOUNTER — Telehealth: Payer: Self-pay | Admitting: Licensed Clinical Social Worker

## 2021-07-13 NOTE — Telephone Encounter (Signed)
LCSW again attempted to reach pt to discussed financial assistance packets provided. No answer, message left requesting a return call if she is interested in further guidance w/ applications.   Octavio Graves, MSW, LCSW [facility] Heart/Vascular Care Navigation  [phone]

## 2021-07-19 ENCOUNTER — Ambulatory Visit (INDEPENDENT_AMBULATORY_CARE_PROVIDER_SITE_OTHER): Payer: Self-pay | Admitting: Family

## 2021-07-19 ENCOUNTER — Other Ambulatory Visit: Payer: Self-pay

## 2021-07-19 ENCOUNTER — Encounter: Payer: Self-pay | Admitting: Family

## 2021-07-19 VITALS — BP 125/80 | HR 69 | Temp 96.5°F

## 2021-07-19 DIAGNOSIS — M4624 Osteomyelitis of vertebra, thoracic region: Secondary | ICD-10-CM

## 2021-07-19 MED ORDER — CEFADROXIL 500 MG PO CAPS: 500.0000 mg | ORAL_CAPSULE | Freq: Two times a day (BID) | ORAL | 1 refills | Status: DC

## 2021-07-19 MED ORDER — DOXYCYCLINE HYCLATE 100 MG PO TABS: 100.0000 mg | ORAL_TABLET | Freq: Two times a day (BID) | ORAL | 1 refills | Status: DC

## 2021-07-19 NOTE — Progress Notes (Signed)
Subjective:    Patient ID: Stacey Kaiser, female    DOB: November 30, 1970, 50 y.o.   MRN: [id number]  Chief Complaint  Patient presents with   Follow-up    Osteomyelitis of thoracic vertebra     HPI:  Stacey Kaiser is a 50 y.o. female with discitis/osteomyelitis of the thoracic spine last seen on 05/03/21 following MRI that showed persistant discitis and osteomyelitis and septic facet arthritis of T8-T9 with paraspinal soft tissue phlegmon with lesser degree of spinal canal narrowing. CRP was 8.1 and ESR 25. Continued on doxycycline and cefedroxil. Here today for follow up.    Stacey Kaiser continues to take her doxycycline and cefedroxil with good adherence and tolerance. Continues to have thoracic back pain which is more centralized to her back with decreased numbness and tingling in her feet. Now with some epigastric/rib pain that waxes and wanes and is described as sharp. No systemic symptoms and denies fever and chills. Ambulating with walker.    Allergies  Allergen Reactions   Okra Hives   Other     Bees and wasps      Outpatient Medications Prior to Visit  Medication Sig Dispense Refill   Coenzyme Q10 (CO Q 10 PO) Take 1 tablet by mouth daily. gummy     Cyanocobalamin (VITAMIN B-12 PO) Take 1 tablet by mouth daily. gummy     furosemide (LASIX) 20 MG tablet Take 1 tablet (20 mg total) by mouth daily. 30 tablet 2   gabapentin (NEURONTIN) 300 MG capsule Take 2 capsules (600 mg total) by mouth 2 (two) times daily. 60 capsule 6   ibuprofen (ADVIL) 200 MG tablet Take 200 mg by mouth every 6 (six) hours as needed.     methocarbamol (ROBAXIN) 500 MG tablet Take 1 tablet (500 mg total) by mouth every 6 (six) hours as needed for muscle spasms. 60 tablet 4   Multiple Vitamins-Minerals (HAIR SKIN AND NAILS FORMULA PO) Take 1 tablet by mouth daily.     Multiple Vitamins-Minerals (ONE-A-DAY WOMENS PETITES PO) Take 1 tablet by mouth daily.     Omega-3 Fatty Acids  (OMEGA-3 FISH OIL PO) Take 1 capsule by mouth daily.     OVER THE COUNTER MEDICATION Take 1 tablet by mouth daily. Power c gummy     Potassium Chloride ER 20 MEQ TBCR Take 1 tablet by mouth daily. 30 tablet 2   cefadroxil (DURICEF) 500 MG capsule Take 1 capsule (500 mg total) by mouth 2 (two) times daily. 60 capsule 1   doxycycline (VIBRA-TABS) 100 MG tablet Take 1 tablet (100 mg total) by mouth 2 (two) times daily. 60 tablet 1   albuterol (VENTOLIN HFA) 108 (90 Base) MCG/ACT inhaler Inhale 2 puffs into the lungs every 4 (four) hours as needed for wheezing or shortness of breath.     No facility-administered medications prior to visit.     Past Medical History:  Diagnosis Date   Diskitis 01/17/2021   Elevated serum creatinine 03/24/2021   Family history of adverse reaction to anesthesia    " my mother had a seizure coming out of cabg surgery "   Morbid obesity ([place])    Morbid obesity with BMI of 50.0-59.9, adult ([place]) 01/17/2021   Scoliosis      Past Surgical History:  Procedure Laterality Date   IR FLUORO GUIDED NEEDLE PLC ASPIRATION/INJECTION LOC  01/18/2021   RADIOLOGY WITH ANESTHESIA N/A 01/18/2021   Procedure: IR WITH ANESTHESIA;  Surgeon: Radiologist, Medication, MD;  Location: [facility];  Service: Radiology;  Laterality: N/A;   WISDOM TOOTH EXTRACTION         Review of Systems  Constitutional:  Negative for chills, diaphoresis, fatigue and fever.  Respiratory:  Negative for cough, chest tightness, shortness of breath and wheezing.   Cardiovascular:  Negative for chest pain.  Gastrointestinal:  Negative for abdominal pain, diarrhea, nausea and vomiting.     Objective:    BP 125/80   Pulse 69   Temp (!) 96.5 F (35.8 C) (Temporal)  Nursing note and vital signs reviewed.  Physical Exam Constitutional:      General: She is not in acute distress.    Appearance: She is well-developed.  Cardiovascular:     Rate and Rhythm: Normal rate and regular rhythm.     Heart sounds:  Normal heart sounds.  Pulmonary:     Effort: Pulmonary effort is normal.     Breath sounds: Normal breath sounds.  Skin:    General: Skin is warm and dry.  Neurological:     Mental Status: She is alert and oriented to person, place, and time.  Psychiatric:        Behavior: Behavior normal.        Thought Content: Thought content normal.        Judgment: Judgment normal.     Depression screen [facility] 2/9 07/19/2021 06/13/2021 05/03/2021 04/11/2021 03/24/2021  Decreased Interest 0 0 0 0 0  Down, Depressed, Hopeless 0 1 0 1 1  PHQ - 2 Score 0 1 0 1 1  Altered sleeping - - - 2 -  Tired, decreased energy - - - 1 -  Change in appetite - - - 2 -  Feeling bad or failure about yourself  - - - 2 -  Trouble concentrating - - - 1 -  Moving slowly or fidgety/restless - - - 0 -  Suicidal thoughts - - - 1 -  PHQ-9 Score - - - 10 -       Assessment & Plan:    Patient Active Problem List   Diagnosis Date Noted   Osteomyelitis of thoracic vertebra ([place]) 01/17/2021   BMI 45.0-49.9, adult ([place]) 01/17/2021     Problem List Items Addressed This Visit       Musculoskeletal and Integument   Osteomyelitis of thoracic vertebra ([place]) - Primary    Stacey Kaiser continues to have back pain which is more centralized with less radiculopathy. Will need to recheck imaging to check status of abscess/phelgmon. Check inflammatory markers today. Recommend follow up with neurosurgery following MRI. Discussed plan of care. Will continue current dose of doxycycline and cefodroxil with follow up pending lab work and imaging results.       Relevant Medications   doxycycline (VIBRA-TABS) 100 MG tablet   cefadroxil (DURICEF) 500 MG capsule   Other Relevant Orders   C-reactive protein   Sedimentation rate   MR THORACIC SPINE W WO CONTRAST     I am having Stacey Kaiser maintain her albuterol, Multiple Vitamins-Minerals (ONE-A-DAY WOMENS PETITES PO), Coenzyme Q10 (CO Q 10 PO), Cyanocobalamin (VITAMIN  B-12 PO), Multiple Vitamins-Minerals (HAIR SKIN AND NAILS FORMULA PO), Omega-3 Fatty Acids (OMEGA-3 FISH OIL PO), OVER THE COUNTER MEDICATION, furosemide, gabapentin, methocarbamol, Potassium Chloride ER, ibuprofen, doxycycline, and cefadroxil.   Meds ordered this encounter  Medications   doxycycline (VIBRA-TABS) 100 MG tablet    Sig: Take 1 tablet (100 mg total) by mouth 2 (two) times daily.    Dispense:  60 tablet  Refill:  1    Please do not fill until refill requested.    Order Specific Question:   Supervising Provider    Answer:   Carlyle Basques [[id number]]   cefadroxil (DURICEF) 500 MG capsule    Sig: Take 1 capsule (500 mg total) by mouth 2 (two) times daily.    Dispense:  60 capsule    Refill:  1    Please do not fill until refill requested.    Order Specific Question:   Supervising Provider    Answer:   Carlyle Basques [[id number]]     Follow-up: Pending imaging and lab work results.    Terri Piedra, MSN, FNP-C Nurse Practitioner [facility] [place] number: [phone]

## 2021-07-19 NOTE — Patient Instructions (Signed)
Nice to see you.  We will check your lab work today and have ordered an MRI to be scheduled.   Continue taking medication as prescribed.   Plan for follow up depending on imaging and lab work results.   Have a great day!

## 2021-07-19 NOTE — Assessment & Plan Note (Addendum)
Stacey Kaiser continues to have back pain which is more centralized with less radiculopathy. Will need to recheck imaging to check status of abscess/phelgmon. Check inflammatory markers today. Recommend follow up with neurosurgery following MRI. Discussed plan of care. Will continue current dose of doxycycline and cefodroxil with follow up pending lab work and imaging results.

## 2021-07-20 LAB — C-REACTIVE PROTEIN: CRP: 11.7 mg/L — ABNORMAL HIGH (ref <8.0)

## 2021-07-20 LAB — SEDIMENTATION RATE: Sed Rate: 17 mm/h (ref 0–20)

## 2021-07-24 ENCOUNTER — Other Ambulatory Visit: Payer: Self-pay | Admitting: Critical Care Medicine

## 2021-07-24 DIAGNOSIS — M4644 Discitis, unspecified, thoracic region: Secondary | ICD-10-CM

## 2021-07-24 NOTE — Telephone Encounter (Signed)
Requested Prescriptions  Pending Prescriptions Disp Refills  . gabapentin (NEURONTIN) 300 MG capsule [Pharmacy Med Name: Gabapentin 300 MG Oral Capsule] 60 capsule 0    Sig: Take 2 capsules by mouth twice daily     Neurology: Anticonvulsants - gabapentin Passed - 07/24/2021  9:53 AM      Passed - Valid encounter within last 12 months    Recent Outpatient Visits          1 month ago Osteomyelitis of thoracic vertebra [facility])   [facility] Storm Frisk, MD   3 months ago Osteomyelitis of thoracic vertebra [facility])   [place] [facility] Storm Frisk, MD   5 months ago Discitis of thoracic region   [facility] [place], Marzella Schlein, [place]      Future Appointments            In 1 month [place] Charlcie Cradle, MD [facility]   In 4 months Little Ishikawa, MD [facility] [place], [facility]

## 2021-07-27 ENCOUNTER — Ambulatory Visit (HOSPITAL_COMMUNITY)
Admission: RE | Admit: 2021-07-27 | Discharge: 2021-07-27 | Disposition: A | Discharge status: Home / Self Care | Payer: Self-pay | Source: Ambulatory Visit | Attending: Family | Admitting: Family

## 2021-07-27 DIAGNOSIS — M4624 Osteomyelitis of vertebra, thoracic region: Secondary | ICD-10-CM | POA: Insufficient documentation

## 2021-07-27 IMAGING — MR MR THORACIC SPINE WO/W CM
8 of 9 series · 31 of 48 positions shown · IV contrast (10 GADAVIST)
Comparison: MRI [DATE].

CLINICAL DATA: Extradural or subdural abscess

EXAM:
MRI THORACIC WITHOUT AND WITH CONTRAST
TECHNIQUE: Multiplanar and multiecho pulse sequences of the thoracic spine were
obtained without and with intravenous contrast.
CONTRAST:  10mL GADAVIST GADOBUTROL 1 MMOL/ML IV SOLN

[Series 16: T1 · sagittal · 4.0mm · 1.72mm/px · 1 of 5 slices shown (1 of 3)]
[im 1/5]
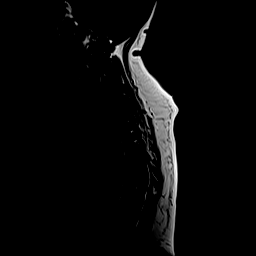

[Series 17: STIR · sagittal · 3.0mm · 1.00mm/px · 2 of 15 slices shown]
[im 1/15]
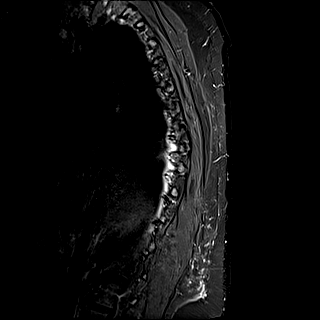
[im 15/15]
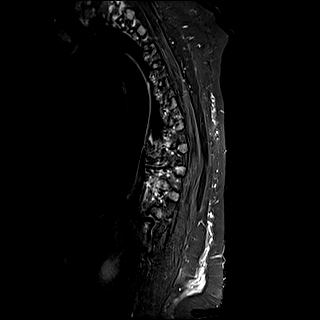

[Series 18: T1 · sagittal · 3.0mm · 1.00mm/px · 3 of 15 slices shown (2 of 3)]
[im 1/15]
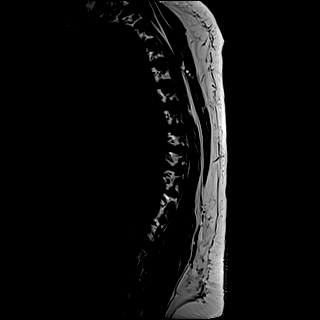
[im 8/15]
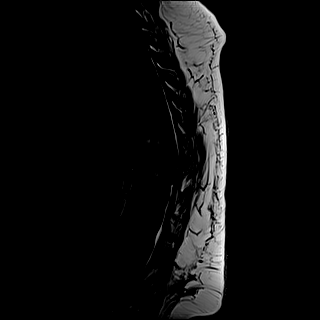
[im 15/15]
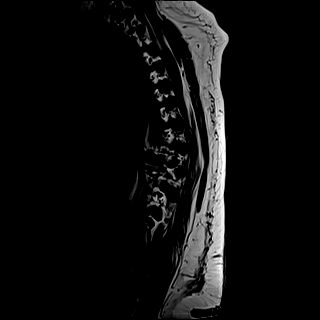

[Series 19: T2 · axial · 4.0mm · 0.78mm/px · z∈[-311,-88]mm · 9 of 46 slices shown]
[im 1/46]
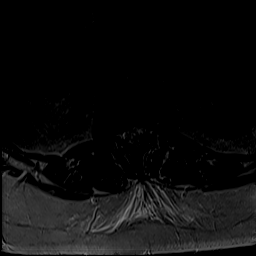
[im 6/46]
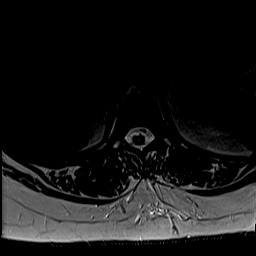
[im 12/46]
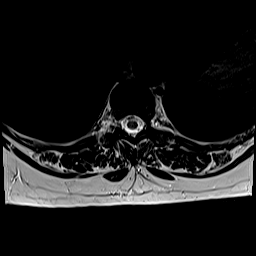
[im 17/46]
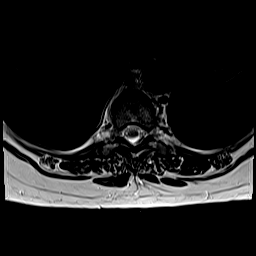
[im 23/46]
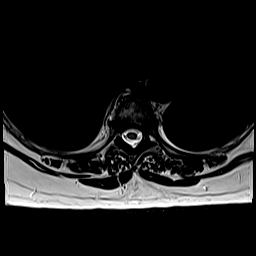
[im 29/46]
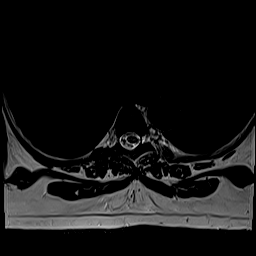
[im 34/46]
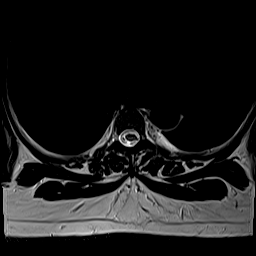
[im 40/46]
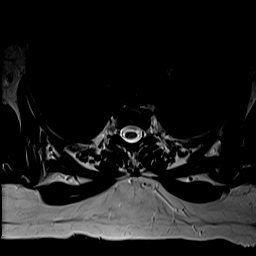
[im 46/46]
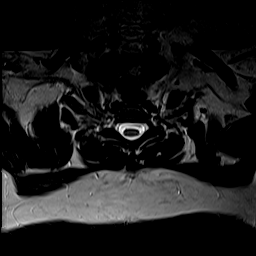

[Series 21: T1 · axial · 4.0mm · 0.39mm/px · z∈[-311,-88]mm · 8 of 46 slices shown (3 of 3)]
[im 1/46]
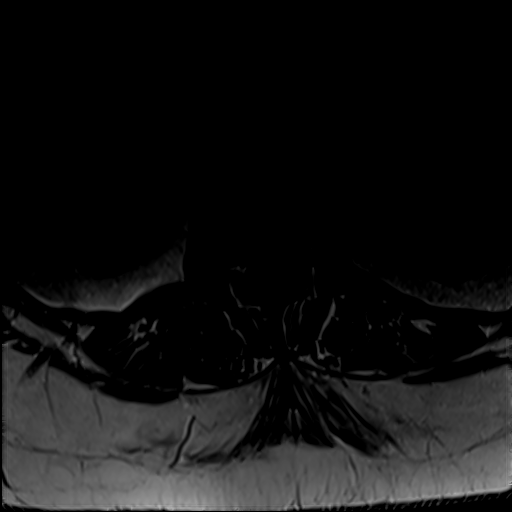
[im 6/46]
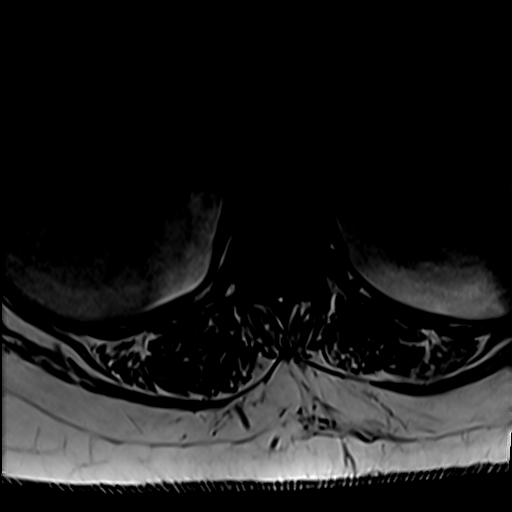
[im 12/46]
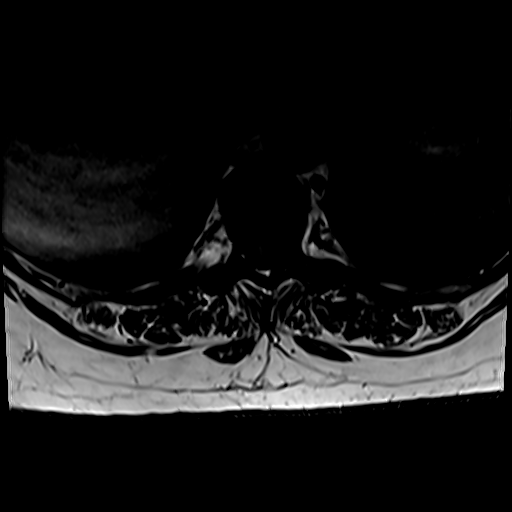
[im 17/46]
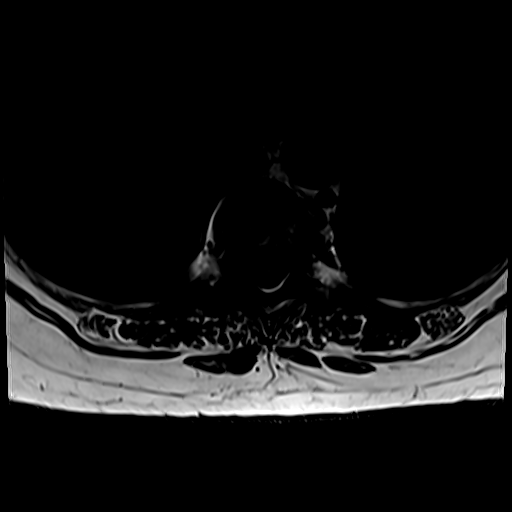
[im 29/46]
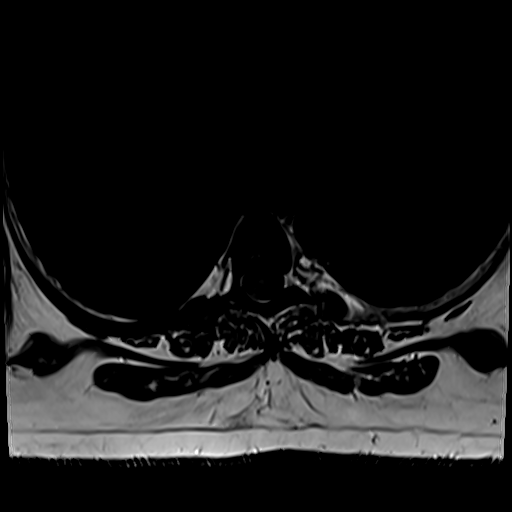
[im 34/46]
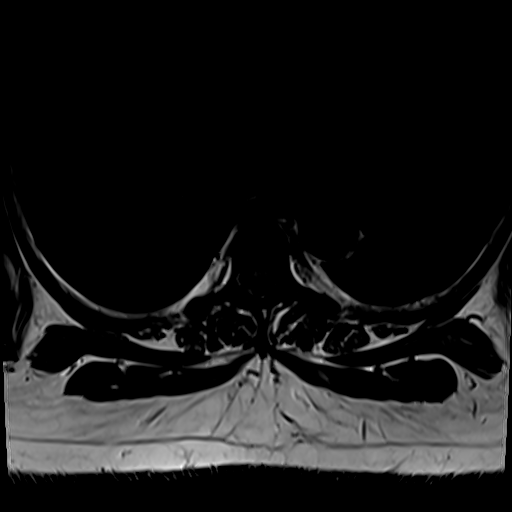
[im 40/46]
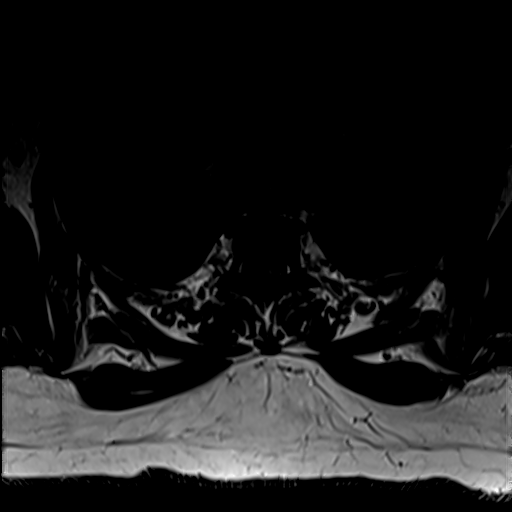
[im 46/46]
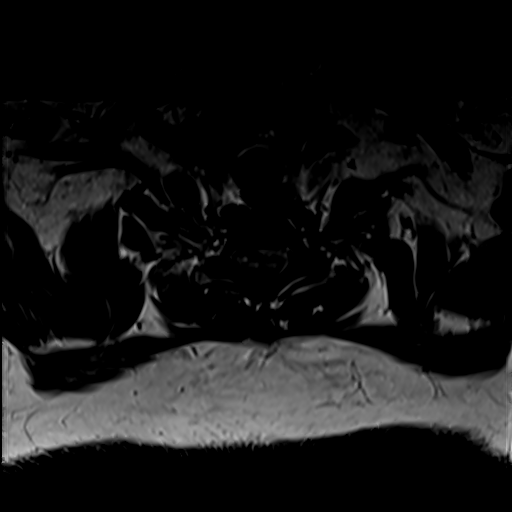

[Series 22: T2 post-contrast · sagittal · 3.0mm · 0.83mm/px · 3 of 15 slices shown]
[im 1/15]
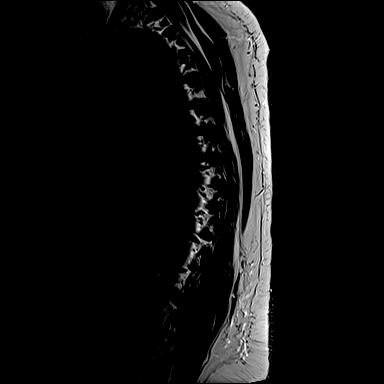
[im 8/15]
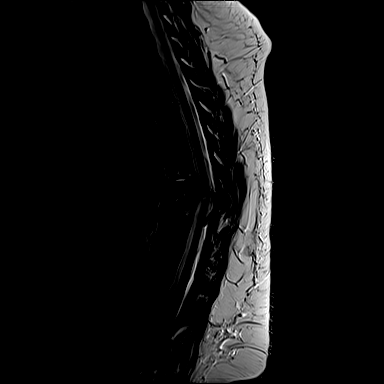
[im 15/15]
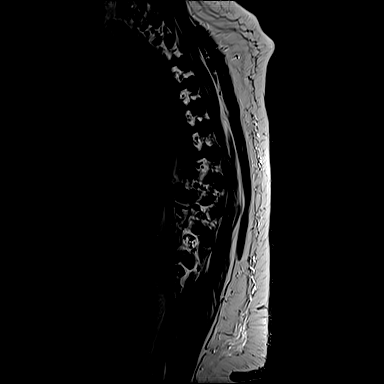

[Series 23: T1 fat-sat post-contrast · sagittal · 3.0mm · 1.00mm/px · 3 of 15 slices shown]
[im 1/15]
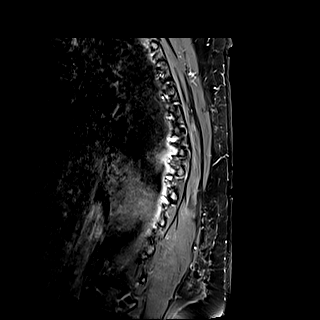
[im 8/15]
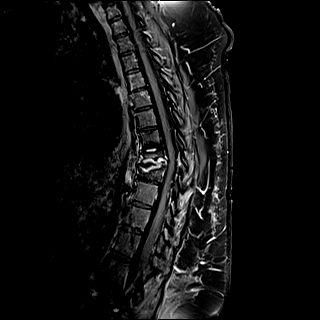
[im 15/15]
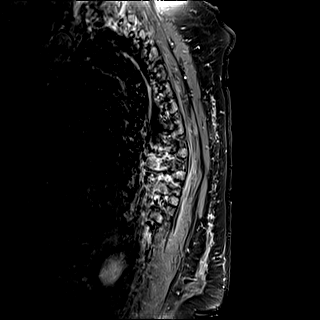

[Series 24: T1 post-contrast · axial · 4.0mm · 0.39mm/px · z∈[-311,-277]mm · 2 of 46 slices shown]
[im 1/46]
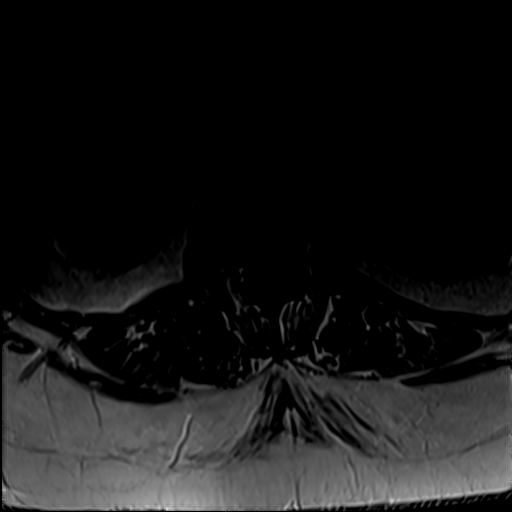
[im 6/46]
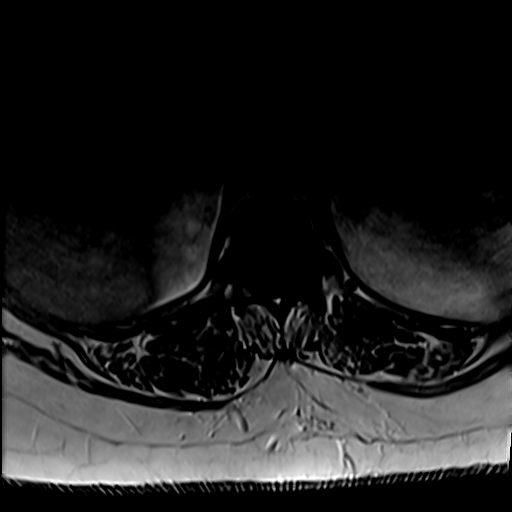

[31 of 48 positions shown; findings below may reference images not displayed]

FINDINGS: Alignment:  Unchanged focal kyphosis at T8-T9.

Vertebrae: Similar edema and enhancement of the T8-T9 endplates with
improved edema and enhancement of the adjacent T8-T9 vertebral
bodies and posterior elements, consistent with
discitis/osteomyelitis. Similar approximately 50% T8 and T9
vertebral body height loss. Similar approximately 4 mm of bony
retropulsion. Thin dorsal epidural enhancement is slightly decreased
in thickness, measuring 3 mm (previously 4 mm).

Cord: Similar versus slightly increased T2 hyperintensity within the
cord at T8-T9.

Paraspinal and other soft tissues: Similar paraspinal edema
enhancement the T8-T9. No discrete drainable fluid collection
identified.

Disc levels:

Findings at T8-T9 are described above. Moderate canal stenosis is
similar. Similar flattening of the cord at this level.

New mild edema within the T7-T8 disc
IMPRESSION: 1. Redemonstrated T8-T9 discitis/osteomyelitis with slightly
improved vertebral body edema/enhancement and slightly improved
dorsal epidural phlegmon. Similar T8-T9 disc edema/enhancement.
2. Similar versus slightly increased T2 hyperintensity in the cord
at T8-T9, concerning for edema and/or myelomalacia. Similar moderate
canal stenosis and similar flattening of the cord.
3. Similar T8 and T9 fractures without progressive height loss.
4. New mild edema within the T7-T8 disc, potentially discitis and
warranting continued attention on follow-up. No change in T7-T8
endplate signal changes.

## 2021-07-27 MED ORDER — GADOBUTROL 1 MMOL/ML IV SOLN
10.0000 mL | Freq: Once | INTRAVENOUS | Status: AC | PRN
  Administered 2021-07-27: 10 mL via INTRAVENOUS

## 2021-08-10 ENCOUNTER — Other Ambulatory Visit: Payer: Self-pay | Admitting: Critical Care Medicine

## 2021-08-10 DIAGNOSIS — M4644 Discitis, unspecified, thoracic region: Secondary | ICD-10-CM

## 2021-08-10 NOTE — Telephone Encounter (Signed)
Requested Prescriptions  Pending Prescriptions Disp Refills  . gabapentin (NEURONTIN) 300 MG capsule [Pharmacy Med Name: Gabapentin 300 MG Oral Capsule] 60 capsule 2    Sig: Take 2 capsules by mouth twice daily     Neurology: Anticonvulsants - gabapentin Passed - 08/10/2021 11:12 AM      Passed - Valid encounter within last 12 months    Recent Outpatient Visits          1 month ago Osteomyelitis of thoracic vertebra [facility])   [facility] Storm Frisk, [facility]   4 months ago Osteomyelitis of thoracic vertebra [facility])   [facility] Storm Frisk, MD   5 months ago Discitis of thoracic region   [facility] [place], Marzella Schlein, [place]      Future Appointments            In 1 month Delford Field Charlcie Cradle, MD [facility]   In 3 months Little Ishikawa, MD [facility] [place], [facility]

## 2021-09-13 ENCOUNTER — Encounter: Payer: Self-pay | Admitting: Critical Care Medicine

## 2021-09-13 ENCOUNTER — Ambulatory Visit: Payer: Self-pay | Attending: Critical Care Medicine | Admitting: Critical Care Medicine

## 2021-09-13 ENCOUNTER — Other Ambulatory Visit: Payer: Self-pay

## 2021-09-13 VITALS — BP 144/96 | HR 85 | Resp 16 | Wt 285.4 lb

## 2021-09-13 DIAGNOSIS — R030 Elevated blood-pressure reading, without diagnosis of hypertension: Secondary | ICD-10-CM

## 2021-09-13 DIAGNOSIS — Z6841 Body Mass Index (BMI) 40.0 and over, adult: Secondary | ICD-10-CM

## 2021-09-13 DIAGNOSIS — I10 Essential (primary) hypertension: Secondary | ICD-10-CM | POA: Insufficient documentation

## 2021-09-13 DIAGNOSIS — E876 Hypokalemia: Secondary | ICD-10-CM

## 2021-09-13 DIAGNOSIS — M4624 Osteomyelitis of vertebra, thoracic region: Secondary | ICD-10-CM

## 2021-09-13 DIAGNOSIS — R7989 Other specified abnormal findings of blood chemistry: Secondary | ICD-10-CM

## 2021-09-13 DIAGNOSIS — M4644 Discitis, unspecified, thoracic region: Secondary | ICD-10-CM

## 2021-09-13 DIAGNOSIS — R635 Abnormal weight gain: Secondary | ICD-10-CM

## 2021-09-13 MED ORDER — GABAPENTIN 300 MG PO CAPS: 600.0000 mg | ORAL_CAPSULE | Freq: Two times a day (BID) | ORAL | 2 refills | Status: DC

## 2021-09-13 MED ORDER — FUROSEMIDE 20 MG PO TABS: 20.0000 mg | ORAL_TABLET | Freq: Every day | ORAL | 2 refills | Status: DC

## 2021-09-13 MED ORDER — POTASSIUM CHLORIDE ER 20 MEQ PO TBCR: 20.0000 meq | EXTENDED_RELEASE_TABLET | Freq: Every day | ORAL | 2 refills | Status: DC

## 2021-09-13 MED ORDER — METHOCARBAMOL 500 MG PO TABS: 500.0000 mg | ORAL_TABLET | Freq: Four times a day (QID) | ORAL | 4 refills | Status: DC | PRN

## 2021-09-13 NOTE — Patient Instructions (Addendum)
Call the cancer control program to apply for free mammogram and pap smear, they will order the studies once approved The number is:   [phone] and see brochure   Referral to Dr. Lovell Sheehan of neurosurgery will be made  Tetanus vaccine was given  Please follow through on getting the mammogram and also get your colon cancer screening, Pap smear right now might not be feasible given your back condition  We recommended the COVID booster shot the Pfizer booster shot is a good 1 to receive [facility] has these  Blood pressure was slightly elevated recommend following again healthy diet low salt intake see attachment, no medications are indicated  Refills on your furosemide and potassium were sent to your pharmacy along with the methocarbamol and gabapentin  Return to Dr. Delford Field 3 months

## 2021-09-13 NOTE — Assessment & Plan Note (Signed)
I went over with the patient proper diet with low salt intake

## 2021-09-13 NOTE — Assessment & Plan Note (Signed)
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

## 2021-09-13 NOTE — Assessment & Plan Note (Signed)
Blood pressure readings have largely been normal today as well as an isolated elevation  Patient is on furosemide and potassium supplementation we will continue same  Discussed healthy diet  Will monitor off other medications

## 2021-09-13 NOTE — Progress Notes (Signed)
Established Patient Office Visit  Subjective:  Patient ID: Stacey Kaiser, female    DOB: 1970-11-16  Age: 50 y.o. MRN: [id number]  CC:  Chief Complaint  Patient presents with   Hypertension    HPI Stacey Kaiser presents for primary care follow-up visit.  On arrival blood pressure 144/96.  Patient continues to complain of mid thoracic pain with radiation to the left upper chest anteriorly.  Patient's weakness in lower extremities is unchanged.  Patient does have discitis at T8-9 and also T7-8 disc.  This was confirmed on recent MRI in October.  The MRI was reviewed. Patient follows with infectious disease and continues with doxycycline and oral cephalosporin therapy in combination.  Patient's pain level is somewhat reduced from before.  She states the methocarbamol and gabapentin do help. Patient is yet to see neurosurgery in follow-up since her hospitalization earlier in the year. At the last infectious disease visit the patient C-reactive protein was elevated Patient is trying to achieve disability but has been not successful.  She is considering obtaining health insurance on the insurance exchange.  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  Patient is yet to achieve a mammogram Pap smear and colon cancer screening.  Patient agreed to receive the tetanus vaccine at this visit.  She inquired about COVID booster shots and was informed to proceed with this   Past Medical History:  Diagnosis Date   Diskitis 01/17/2021   Elevated serum creatinine 03/24/2021   Family history of adverse reaction to anesthesia    " my mother had a seizure coming out of cabg surgery "   Morbid obesity ([place])    Morbid obesity with BMI of 50.0-59.9, adult ([place]) 01/17/2021   Scoliosis     Past Surgical History:  Procedure Laterality Date   IR FLUORO GUIDED NEEDLE PLC ASPIRATION/INJECTION LOC  01/18/2021   RADIOLOGY WITH ANESTHESIA N/A 01/18/2021    Procedure: IR WITH ANESTHESIA;  Surgeon: Radiologist, Medication, MD;  Location: [place];  Service: Radiology;  Laterality: N/A;   WISDOM TOOTH EXTRACTION      Family History  Problem Relation Age of Onset   Heart failure Mother    Chronic Renal Failure Mother    Diabetes Mother     Social History   Socioeconomic History   Marital status: Soil scientist    Spouse name: Not on file   Number of children: 0   Years of education: Not on file   Highest education level: Not on file  Occupational History   Occupation: retail at [employer]  Tobacco Use   Smoking status: Never   Smokeless tobacco: Never  Vaping Use   Vaping Use: Never used  Substance and Sexual Activity   Alcohol use: Not Currently    Comment: rare   Drug use: No   Sexual activity: Not on file  Other Topics Concern   Not on file  Social History Narrative   Not on file   Social Determinants of Health   Financial Resource Strain: Medium Risk   Difficulty of Paying Living Expenses: Somewhat hard  Food Insecurity: No Food Insecurity   Worried About Charity fundraiser in the Last Year: Never true   Ran Out of Food in the Last Year: Never true  Transportation Needs: No Transportation Needs   Lack of Transportation (Medical): No   Lack of Transportation (Non-Medical): No  Physical Activity: Not on file  Stress: Stress Concern Present  Feeling of Stress : To some extent  Social Connections: Not on file  Intimate Partner Violence: Not on file    Outpatient Medications Prior to Visit  Medication Sig Dispense Refill   cefadroxil (DURICEF) 500 MG capsule Take 1 capsule (500 mg total) by mouth 2 (two) times daily. 60 capsule 1   Coenzyme Q10 (CO Q 10 PO) Take 1 tablet by mouth daily. gummy     Cyanocobalamin (VITAMIN B-12 PO) Take 1 tablet by mouth daily. gummy     doxycycline (VIBRA-TABS) 100 MG tablet Take 1 tablet (100 mg total) by mouth 2 (two) times daily. 60 tablet 1   ibuprofen (ADVIL) 200 MG  tablet Take 200 mg by mouth every 6 (six) hours as needed.     Multiple Vitamins-Minerals (HAIR SKIN AND NAILS FORMULA PO) Take 1 tablet by mouth daily.     Multiple Vitamins-Minerals (ONE-A-DAY WOMENS PETITES PO) Take 1 tablet by mouth daily.     Omega-3 Fatty Acids (OMEGA-3 FISH OIL PO) Take 1 capsule by mouth daily.     OVER THE COUNTER MEDICATION Take 1 tablet by mouth daily. Power c gummy     furosemide (LASIX) 20 MG tablet Take 1 tablet (20 mg total) by mouth daily. 30 tablet 2   gabapentin (NEURONTIN) 300 MG capsule Take 2 capsules by mouth twice daily 60 capsule 2   methocarbamol (ROBAXIN) 500 MG tablet Take 1 tablet (500 mg total) by mouth every 6 (six) hours as needed for muscle spasms. 60 tablet 4   Potassium Chloride ER 20 MEQ TBCR Take 1 tablet by mouth daily. 30 tablet 2   albuterol (VENTOLIN HFA) 108 (90 Base) MCG/ACT inhaler Inhale 2 puffs into the lungs every 4 (four) hours as needed for wheezing or shortness of breath.     No facility-administered medications prior to visit.    Allergies  Allergen Reactions   Okra Hives   Other     Bees and wasps    ROS Review of Systems  Constitutional:  Negative for chills, diaphoresis and fever.  HENT:  Negative for congestion, hearing loss, nosebleeds, sore throat and tinnitus.   Eyes:  Negative for photophobia and redness.  Respiratory:  Negative for cough, shortness of breath, wheezing and stridor.   Cardiovascular:  Negative for chest pain, palpitations and leg swelling.  Gastrointestinal:  Negative for abdominal pain, blood in stool, constipation, diarrhea, nausea and vomiting.  Endocrine: Negative for polydipsia.  Genitourinary:  Negative for dysuria, flank pain, frequency, hematuria and urgency.  Musculoskeletal:  Positive for back pain and gait problem. Negative for myalgias and neck pain.  Skin:  Negative for rash.  Allergic/Immunologic: Negative for environmental allergies.  Neurological:  Negative for dizziness,  tremors, seizures, weakness and headaches.  Hematological:  Does not bruise/bleed easily.  Psychiatric/Behavioral:  Negative for suicidal ideas. The patient is not nervous/anxious.      Objective:    Physical Exam Vitals reviewed.  Constitutional:      Appearance: Normal appearance. She is well-developed. She is obese. She is not diaphoretic.  HENT:     Head: Normocephalic and atraumatic.     Nose: No nasal deformity, septal deviation, mucosal edema or rhinorrhea.     Right Sinus: No maxillary sinus tenderness or frontal sinus tenderness.     Left Sinus: No maxillary sinus tenderness or frontal sinus tenderness.     Mouth/Throat:     Pharynx: No oropharyngeal exudate.  Eyes:     General: No scleral icterus.  Conjunctiva/sclera: Conjunctivae normal.     Pupils: Pupils are equal, round, and reactive to light.  Neck:     Thyroid: No thyromegaly.     Vascular: No carotid bruit or JVD.     Trachea: Trachea normal. No tracheal tenderness or tracheal deviation.  Cardiovascular:     Rate and Rhythm: Normal rate and regular rhythm.     Chest Wall: PMI is not displaced.     Pulses: Normal pulses. No decreased pulses.     Heart sounds: Normal heart sounds, S1 normal and S2 normal. Heart sounds not distant. No murmur heard. No systolic murmur is present.  No diastolic murmur is present.    No friction rub. No gallop. No S3 or S4 sounds.  Pulmonary:     Effort: No tachypnea, accessory muscle usage or respiratory distress.     Breath sounds: No stridor. No decreased breath sounds, wheezing, rhonchi or rales.  Chest:     Chest wall: No tenderness.  Abdominal:     General: Bowel sounds are normal. There is no distension.     Palpations: Abdomen is soft. Abdomen is not rigid.     Tenderness: There is no abdominal tenderness. There is no guarding or rebound.  Musculoskeletal:        General: Tenderness present. Normal range of motion.     Cervical back: Normal range of motion and neck  supple. No edema, erythema or rigidity. No muscular tenderness. Normal range of motion.     Comments: Significant point tenderness continues in the mid thoracic spine  Lower extremity weakness is unchanged  Lymphadenopathy:     Head:     Right side of head: No submental or submandibular adenopathy.     Left side of head: No submental or submandibular adenopathy.     Cervical: No cervical adenopathy.  Skin:    General: Skin is warm and dry.     Coloration: Skin is not pale.     Findings: No rash.     Nails: There is no clubbing.  Neurological:     Mental Status: She is alert and oriented to person, place, and time.     Sensory: No sensory deficit.  Psychiatric:        Mood and Affect: Mood normal.        Speech: Speech normal.        Behavior: Behavior normal.        Thought Content: Thought content normal.        Judgment: Judgment normal.    BP (!) 144/96   Pulse 85   Resp 16   Wt 285 lb 6.4 oz (129.5 kg)   SpO2 97%   BMI 61.76 kg/m  Wt Readings from Last 3 Encounters:  09/13/21 285 lb 6.4 oz (129.5 kg)  06/13/21 278 lb 9.6 oz (126.4 kg)  05/25/21 271 lb 9.6 oz (123.2 kg)     Health Maintenance Due  Topic Date Due   PAP SMEAR-Modifier  Never done   COLON CANCER SCREENING ANNUAL FOBT  Never done   MAMMOGRAM  Never done   Zoster Vaccines- Shingrix (1 of 2) Never done   COVID-19 Vaccine (4 - Booster for Pfizer series) 09/18/2020    There are no preventive care reminders to display for this patient.  Lab Results  Component Value Date   TSH 3.660 02/24/2021   Lab Results  Component Value Date   WBC 8.0 03/24/2021   HGB 14.1 03/24/2021   HCT 42.0 03/24/2021  MCV 87.7 03/24/2021   PLT 364 03/24/2021   Lab Results  Component Value Date   NA 139 05/03/2021   K 4.6 05/03/2021   CO2 24 05/03/2021   GLUCOSE 109 (H) 05/03/2021   BUN 22 05/03/2021   CREATININE 1.07 (H) 05/03/2021   BILITOT 0.4 02/28/2021   ALKPHOS 68 02/28/2021   AST 21 02/28/2021   ALT 13  02/28/2021   PROT 6.6 02/28/2021   ALBUMIN 3.8 02/28/2021   CALCIUM 9.7 05/03/2021   ANIONGAP 10 02/28/2021   EGFR 56 (L) 02/24/2021   No results found for: CHOL No results found for: HDL No results found for: LDLCALC No results found for: TRIG No results found for: CHOLHDL Lab Results  Component Value Date   HGBA1C 5.7 (H) 01/17/2021      Assessment & Plan:   Problem List Items Addressed This Visit       Musculoskeletal and Integument   Osteomyelitis of thoracic vertebra ([place]) - Primary    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  Plan to refer to neurosurgery for another opinion  Refill methocarbamol and gabapentin for now  Antibiotics per infectious disease      Relevant Medications   gabapentin (NEURONTIN) 300 MG capsule   methocarbamol (ROBAXIN) 500 MG tablet   Other Relevant Orders   Ambulatory referral to Neurosurgery     Other   BMI 45.0-49.9, adult ([place])    I went over with the patient proper diet with low salt intake      Elevated blood pressure reading    Blood pressure readings have largely been normal today as well as an isolated elevation  Patient is on furosemide and potassium supplementation we will continue same  Discussed healthy diet  Will monitor off other medications      Other Visit Diagnoses     Elevated brain natriuretic peptide (BNP) level       Relevant Medications   furosemide (LASIX) 20 MG tablet   Weight gain       Relevant Medications   furosemide (LASIX) 20 MG tablet   Discitis of thoracic region       Relevant Medications   gabapentin (NEURONTIN) 300 MG capsule   methocarbamol (ROBAXIN) 500 MG tablet   Hypokalemia       Relevant Medications   Potassium Chloride ER 20 MEQ TBCR       Meds ordered this encounter  Medications   furosemide (LASIX) 20 MG tablet    Sig: Take 1 tablet (20 mg total) by mouth daily.    Dispense:  30 tablet    Refill:  2   gabapentin (NEURONTIN) 300 MG  capsule    Sig: Take 2 capsules (600 mg total) by mouth 2 (two) times daily.    Dispense:  60 capsule    Refill:  2   methocarbamol (ROBAXIN) 500 MG tablet    Sig: Take 1 tablet (500 mg total) by mouth every 6 (six) hours as needed for muscle spasms.    Dispense:  60 tablet    Refill:  4   Potassium Chloride ER 20 MEQ TBCR    Sig: Take 1 tablet by mouth daily.    Dispense:  30 tablet    Refill:  2   Patient given number to call to obtain a free mammogram also encouraged to have her colon cancer screening kit completed  While she is due a Pap smear she is likely not a good candidate for  a pelvic exam now due to her severe mid back pain Follow-up: Return in about 3 months (around 12/13/2021).    Asencion Noble, MD

## 2021-11-08 ENCOUNTER — Other Ambulatory Visit: Payer: Self-pay | Admitting: Family

## 2021-11-08 DIAGNOSIS — M4624 Osteomyelitis of vertebra, thoracic region: Secondary | ICD-10-CM

## 2021-11-19 ENCOUNTER — Other Ambulatory Visit: Payer: Self-pay | Admitting: Critical Care Medicine

## 2021-11-19 DIAGNOSIS — M4644 Discitis, unspecified, thoracic region: Secondary | ICD-10-CM

## 2021-11-21 NOTE — Telephone Encounter (Signed)
Requested medication (s) are due for refill today: Yes  Requested medication (s) are on the active medication list: Yes  Last refill:  09/13/21  Future visit scheduled: Yes  Notes to clinic:  Protocol indicates pt. Needs lab work.    Requested Prescriptions  Pending Prescriptions Disp Refills   gabapentin (NEURONTIN) 300 MG capsule [Pharmacy Med Name: Gabapentin 300 MG Oral Capsule] 60 capsule 0    Sig: Take 2 capsules by mouth twice daily     Neurology: Anticonvulsants - gabapentin Failed - 11/19/2021  1:28 PM      Failed - Cr in normal range and within 360 days    Creat  Date Value Ref Range Status  05/03/2021 1.07 (H) 0.50 - 1.03 mg/dL Final          Passed - Completed PHQ-2 or PHQ-9 in the last 360 days      Passed - Valid encounter within last 12 months    Recent Outpatient Visits           2 months ago Osteomyelitis of thoracic vertebra [facility])   [facility] Storm Frisk, MD   5 months ago Osteomyelitis of thoracic vertebra [facility])   [facility] Storm Frisk, MD   7 months ago Osteomyelitis of thoracic vertebra [facility])   [facility] Storm Frisk, MD   9 months ago Discitis of thoracic region   [facility] [place], Marzella Schlein, [place]       Future Appointments             In 2 weeks Little Ishikawa, MD [facility] [place], [facility]   In 3 weeks Storm Frisk, MD [facility]

## 2021-11-24 NOTE — Telephone Encounter (Signed)
Patient called in to check progress of medication refill

## 2021-11-25 ENCOUNTER — Telehealth: Payer: Self-pay | Admitting: Critical Care Medicine

## 2021-11-25 ENCOUNTER — Encounter: Payer: Self-pay | Admitting: Critical Care Medicine

## 2021-11-25 DIAGNOSIS — M4644 Discitis, unspecified, thoracic region: Secondary | ICD-10-CM

## 2021-11-25 DIAGNOSIS — Z1231 Encounter for screening mammogram for malignant neoplasm of breast: Secondary | ICD-10-CM

## 2021-11-25 NOTE — Telephone Encounter (Signed)
Duplicate request Requested Prescriptions  Pending Prescriptions Disp Refills   gabapentin (NEURONTIN) 300 MG capsule [Pharmacy Med Name: Gabapentin 300 MG Oral Capsule] 60 capsule 0    Sig: Take 2 capsules by mouth twice daily     Neurology: Anticonvulsants - gabapentin Failed - 11/25/2021 10:53 AM      Failed - Cr in normal range and within 360 days    Creat  Date Value Ref Range Status  05/03/2021 1.07 (H) 0.50 - 1.03 mg/dL Final         Passed - Completed PHQ-2 or PHQ-9 in the last 360 days      Passed - Valid encounter within last 12 months    Recent Outpatient Visits          2 months ago Osteomyelitis of thoracic vertebra [facility])   [place] [facility] Storm Frisk, MD   5 months ago Osteomyelitis of thoracic vertebra [facility])   [place] Storm Frisk, MD   7 months ago Osteomyelitis of thoracic vertebra [facility])   [facility] Storm Frisk, MD   9 months ago Discitis of thoracic region   [facility] [place], Marzella Schlein, [place]      Future Appointments            In 1 week Little Ishikawa, MD [facility] [place], [facility]   In 2 weeks Storm Frisk, MD [facility]

## 2021-11-25 NOTE — Telephone Encounter (Signed)
Rx was sent today.

## 2021-12-05 ENCOUNTER — Telehealth: Payer: Self-pay

## 2021-12-05 DIAGNOSIS — M4624 Osteomyelitis of vertebra, thoracic region: Secondary | ICD-10-CM

## 2021-12-05 MED ORDER — CEFADROXIL 500 MG PO CAPS: 500.0000 mg | ORAL_CAPSULE | Freq: Two times a day (BID) | ORAL | 0 refills | Status: DC

## 2021-12-05 MED ORDER — DOXYCYCLINE HYCLATE 100 MG PO TABS: 100.0000 mg | ORAL_TABLET | Freq: Two times a day (BID) | ORAL | 0 refills | Status: DC

## 2021-12-05 NOTE — Addendum Note (Signed)
Addended by: Lucie Leather D on: 12/05/2021 10:34 AM   Modules accepted: Orders

## 2021-12-05 NOTE — Telephone Encounter (Signed)
Received faxed refill request for doxycycline and cefadroxil. Last fill was 09/2021 and patient does not have follow up scheduled. Will route to provider.   Beryle Flock, RN

## 2021-12-05 NOTE — Telephone Encounter (Signed)
Called patient to schedule appointment, no answer. Left HIPAA compliant voicemail requesting callback.   Towanda Hornstein D Cerria Randhawa, RN  

## 2021-12-05 NOTE — Progress Notes (Signed)
Cardiology Office Note:    Date:  12/07/2021   ID:  Stacey Kaiser, DOB 1971/05/05, MRN [id number]  PCP:  Storm Frisk, MD  Cardiologist:  Little Ishikawa, MD  Electrophysiologist:  None   Referring MD: Storm Frisk, MD   Chief Complaint  Patient presents with   Shortness of Breath    History of Present Illness:    Stacey Kaiser is a 51 y.o. female with a hx of morbid obesity who is referred by Georgian Co, PA for evaluation of dyspnea.  Echocardiogram 01/19/2021 showed EF 55 to 60%, normal diastolic function, normal RV function, RVSP 50 mmHg, mild MR. She was admitted in April 2022 with discitis/osteomyelitis and compression fractures in spine.  She continues on oral antibiotics, follows with ID.  She reports she has been having dyspnea with exertion.  States gets short of breath with minimal exertion.  She denies any chest pain.  Denies any lightheadedness, syncope, palpitations, or lower extremity edema.  No smoking history.  Family history includes mother had CABG in 27s and CHF.  Since last clinic visit, weight is up 23 pounds in last 6 months.  She continues to have issues with back pain.  Reports her dyspnea has improved.  She is minimally active, most exertion she does is walking in her apartment.  She denies any chest pain or syncope.  Does report some lightheadedness when she takes Robaxin.  Denies any palpitations.  Intermittent swelling in her legs.  She has been started on Lasix 20 mg daily.  Chest x-ray actually check a BMET Wt Readings from Last 3 Encounters:  12/07/21 294 lb 6.4 oz (133.5 kg)  09/13/21 285 lb 6.4 oz (129.5 kg)  06/13/21 278 lb 9.6 oz (126.4 kg)   BP Readings from Last 3 Encounters:  12/07/21 (!) 142/87  09/13/21 (!) 144/96  07/19/21 125/80     Past Medical History:  Diagnosis Date   Diskitis 01/17/2021   Elevated serum creatinine 03/24/2021   Family history of adverse reaction to anesthesia    " my mother had  a seizure coming out of cabg surgery "   Morbid obesity (HCC)    Morbid obesity with BMI of 50.0-59.9, adult (HCC) 01/17/2021   Scoliosis     Past Surgical History:  Procedure Laterality Date   IR FLUORO GUIDED NEEDLE PLC ASPIRATION/INJECTION LOC  01/18/2021   RADIOLOGY WITH ANESTHESIA N/A 01/18/2021   Procedure: IR WITH ANESTHESIA;  Surgeon: Radiologist, Medication, MD;  Location: [facility] OR;  Service: Radiology;  Laterality: N/A;   WISDOM TOOTH EXTRACTION      Current Medications: Current Meds  Medication Sig   cefadroxil (DURICEF) 500 MG capsule Take 1 capsule (500 mg total) by mouth 2 (two) times daily.   Coenzyme Q10 (CO Q 10 PO) Take 1 tablet by mouth daily. gummy   Cyanocobalamin (VITAMIN B-12 PO) Take 1 tablet by mouth daily. gummy   doxycycline (VIBRA-TABS) 100 MG tablet Take 1 tablet (100 mg total) by mouth 2 (two) times daily.   furosemide (LASIX) 20 MG tablet Take 1 tablet (20 mg total) by mouth daily.   gabapentin (NEURONTIN) 300 MG capsule Take 2 capsules by mouth twice daily   ibuprofen (ADVIL) 200 MG tablet Take 200 mg by mouth every 6 (six) hours as needed.   methocarbamol (ROBAXIN) 500 MG tablet Take 1 tablet (500 mg total) by mouth every 6 (six) hours as needed for muscle spasms.   Multiple Vitamins-Minerals (HAIR SKIN AND NAILS FORMULA PO) Take  1 tablet by mouth daily.   Multiple Vitamins-Minerals (ONE-A-DAY WOMENS PETITES PO) Take 1 tablet by mouth daily.   Omega-3 Fatty Acids (OMEGA-3 FISH OIL PO) Take 1 capsule by mouth daily.   OVER THE COUNTER MEDICATION Take 1 tablet by mouth daily. Power c gummy   Potassium Chloride ER 20 MEQ TBCR Take 1 tablet by mouth daily.     Allergies:   Okra and Other   Social History   Socioeconomic History   Marital status: Media planner    Spouse name: Not on file   Number of children: 0   Years of education: Not on file   Highest education level: Not on file  Occupational History   Occupation: retail at [employer]   Tobacco Use   Smoking status: Never   Smokeless tobacco: Never  Vaping Use   Vaping Use: Never used  Substance and Sexual Activity   Alcohol use: Not Currently    Comment: rare   Drug use: No   Sexual activity: Not on file  Other Topics Concern   Not on file  Social History Narrative   Not on file   Social Determinants of Health   Financial Resource Strain: Medium Risk   Difficulty of Paying Living Expenses: Somewhat hard  Food Insecurity: No Food Insecurity   Worried About Programme researcher, broadcasting/film/video in the Last Year: Never true   Ran Out of Food in the Last Year: Never true  Transportation Needs: No Transportation Needs   Lack of Transportation (Medical): No   Lack of Transportation (Non-Medical): No  Physical Activity: Not on file  Stress: Stress Concern Present   Feeling of Stress : To some extent  Social Connections: Not on file     Family History: The patient's family history includes Chronic Renal Failure in her mother; Diabetes in her mother; Heart failure in her mother.  ROS:   Please see the history of present illness.     All other systems reviewed and are negative.  EKGs/Labs/Other Studies Reviewed:    The following studies were reviewed today:   EKG:   12/07/21: NSR, LAD, poor R wave progression, rate 91  Recent Labs: 02/24/2021: BNP 63.7; TSH 3.660 02/28/2021: ALT 13 03/24/2021: Hemoglobin 14.1; Platelets 364 05/03/2021: BUN 22; Creat 1.07; Potassium 4.6; Sodium 139  Recent Lipid Panel No results found for: CHOL, TRIG, HDL, CHOLHDL, VLDL, LDLCALC, LDLDIRECT  Physical Exam:    VS:  BP (!) 142/87    Pulse 91    Ht 4\' 9"  (1.448 m)    Wt 294 lb 6.4 oz (133.5 kg)    SpO2 96%    BMI 63.71 kg/m     Wt Readings from Last 3 Encounters:  12/07/21 294 lb 6.4 oz (133.5 kg)  09/13/21 285 lb 6.4 oz (129.5 kg)  06/13/21 278 lb 9.6 oz (126.4 kg)     GEN:  Well nourished, well developed in no acute distress HEENT: Normal NECK: No JVD; No carotid bruits LYMPHATICS:  No lymphadenopathy CARDIAC: RRR, no murmurs, rubs, gallops RESPIRATORY:  Clear to auscultation without rales, wheezing or rhonchi  ABDOMEN: Soft, non-tender, non-distended MUSCULOSKELETAL:  No edema; No deformity  SKIN: Warm and dry NEUROLOGIC:  Alert and oriented x 3 PSYCHIATRIC:  Normal affect   ASSESSMENT:    1. Morbid obesity (HCC)   2. Shortness of breath   3. Essential hypertension   4. Snoring     PLAN:    Dyspnea: Suspect due to obesity/deconditioning.  Echocardiogram  in April 2022 showed normal systolic/diastolic function.  Elevated pulmonary pressures on echo, suspect untreated OSA/OHS.  Discussed obtaining sleep study but patient is self-pay and would prefer to hold off at this time.  Asked our social worker to reach out to the patient about available resources.  Referred to healthy weight and wellness.  If if dyspnea does not improve with weight loss, would recommend further work-up including ischemia evaluation. -She reports improvement in her dyspnea.  Has been started on Lasix 20 mg daily.  We will continue.  Check BMET, magnesium.  Will hold potassium supplement with starting losartan as below and f/u BMET in 1-2 weeks  Morbid obesity: Body mass index is 63.71 kg/m.  Refer to healthy weight and wellness.  Refer to pharmacy clinic for evaluation for Ozempic   Hypertension: BP has been elevated at recent clinic visits.  Will start losartan 50 mg daily.  Check BP daily for next 2 weeks and call with results.  Check BMET in 1 week  Snoring: Suspect OSA.  Recommend sleep study   RTC in 6 months  Medication Adjustments/Labs and Tests Ordered: Current medicines are reviewed at length with the patient today.  Concerns regarding medicines are outlined above.  Orders Placed This Encounter  Procedures   Magnesium   Basic metabolic panel   Basic metabolic panel   Amb Ref to Medical Weight Management   AMB Referral to [facility]   EKG 12-Lead   Split night study    No orders of the defined types were placed in this encounter.    Patient Instructions  Medication Instructions:  STOP POTASSIUM  START: LOSARTAN 50mg  ONCE DAILY  *If you need a refill on your cardiac medications before your next appointment, please call your pharmacy*  Lab Work: BLOOD WORK TODAY    Please return for Blood Work in 1 WEEK AFTER STARTING LOSARTAN. No appointment needed, lab here at the office is open Monday-Friday from 8AM to 4PM and closed daily for lunch from 12:45-1:45.   If you have labs (blood work) drawn today and your tests are completely normal, you will receive your results only by: MyChart Message (if you have MyChart) OR A paper copy in the mail If you have any lab test that is abnormal or we need to change your treatment, we will call you to review the results.  Testing/Procedures: Your physician has recommended that you have a sleep study. This test records several body functions during sleep, including: brain activity, eye movement, oxygen and carbon dioxide blood levels, heart rate and rhythm, breathing rate and rhythm, the flow of air through your mouth and nose, snoring, body muscle movements, and chest and belly movement. SOMEONE WILL REACH OUT TO YOU TO SCHEDULE THIS   Follow-Up: At [facility], you and your health needs are our priority.  As part of our continuing mission to provide you with exceptional heart care, we have created designated Provider Care Teams.  These Care Teams include your primary Cardiologist (physician) and Advanced Practice Providers (APPs -  Physician Assistants and Nurse Practitioners) who all work together to provide you with the care you need, when you need it.  PLEASE SCHEDULE APPOINTMENT WITH PHARMD AT NEXT AVAILABLE TO DISCUSS OZEMPIC  Your next appointment:   6 month(s)  The format for your next appointment:   In Person  Provider:   [facility], MD    Other Instructions  REFERRAL TO HEALTHY  WEIGHT AND WELLNESS  Please check your blood pressure  at home daily, write it down.  Call the office or send message via Mychart with the readings in 2 weeks for Dr. Bjorn PippinSchumann to review.       Signed, Little Ishikawahristopher L Vadim Centola, MD  12/07/2021 8:49 AM    [facility]

## 2021-12-07 ENCOUNTER — Other Ambulatory Visit: Payer: Self-pay

## 2021-12-07 ENCOUNTER — Encounter: Payer: Self-pay | Admitting: Cardiology

## 2021-12-07 ENCOUNTER — Ambulatory Visit (INDEPENDENT_AMBULATORY_CARE_PROVIDER_SITE_OTHER): Payer: Managed Care, Other (non HMO) | Admitting: Cardiology

## 2021-12-07 DIAGNOSIS — I10 Essential (primary) hypertension: Secondary | ICD-10-CM

## 2021-12-07 DIAGNOSIS — R0683 Snoring: Secondary | ICD-10-CM | POA: Diagnosis not present

## 2021-12-07 DIAGNOSIS — R0602 Shortness of breath: Secondary | ICD-10-CM

## 2021-12-07 LAB — BASIC METABOLIC PANEL
BUN/Creatinine Ratio: 13 (ref 9–23)
BUN: 12 mg/dL (ref 6–24)
CO2: 25 mmol/L (ref 20–29)
Calcium: 9.6 mg/dL (ref 8.7–10.2)
Chloride: 100 mmol/L (ref 96–106)
Creatinine, Ser: 0.96 mg/dL (ref 0.57–1.00)
Glucose: 108 mg/dL — ABNORMAL HIGH (ref 70–99)
Potassium: 4.4 mmol/L (ref 3.5–5.2)
Sodium: 140 mmol/L (ref 134–144)
eGFR: 72 mL/min/{1.73_m2} (ref >59)

## 2021-12-07 LAB — MAGNESIUM: Magnesium: 1.9 mg/dL (ref 1.6–2.3)

## 2021-12-07 MED ORDER — LOSARTAN POTASSIUM 50 MG PO TABS: 50.0000 mg | ORAL_TABLET | Freq: Every day | ORAL | 3 refills | Status: DC

## 2021-12-07 NOTE — Patient Instructions (Addendum)
Medication Instructions:  STOP POTASSIUM  START: LOSARTAN 50mg  ONCE DAILY  *If you need a refill on your cardiac medications before your next appointment, please call your pharmacy*  Lab Work: BLOOD WORK TODAY    Please return for Blood Work in [place]. No appointment needed, lab here at the office is open Monday-Friday from 8AM to 4PM and closed daily for lunch from 12:45-1:45.   If you have labs (blood work) drawn today and your tests are completely normal, you will receive your results only by: [place] (if you have MyChart) OR A paper copy in the mail If you have any lab test that is abnormal or we need to change your treatment, we will call you to review the results.  Testing/Procedures: Your physician has recommended that you have a sleep study. This test records several body functions during sleep, including: brain activity, eye movement, oxygen and carbon dioxide blood levels, heart rate and rhythm, breathing rate and rhythm, the flow of air through your mouth and nose, snoring, body muscle movements, and chest and belly movement. SOMEONE WILL REACH OUT TO YOU TO SCHEDULE THIS   Follow-Up: At [facility], you and your health needs are our priority.  As part of our continuing mission to provide you with exceptional heart care, we have created designated Provider Care Teams.  These Care Teams include your primary Cardiologist (physician) and Advanced Practice Providers (APPs -  Physician Assistants and Nurse Practitioners) who all work together to provide you with the care you need, when you need it.  PLEASE SCHEDULE APPOINTMENT WITH PHARMD AT NEXT AVAILABLE TO DISCUSS OZEMPIC  Your next appointment:   6 month(s)  The format for your next appointment:   In Person  Provider:   Donato Heinz, MD    Other Instructions  REFERRAL TO [place]  Please check your blood pressure at home daily, write it down.  Call the  office or send message via Mychart with the readings in 2 weeks for Dr. Gardiner Rhyme to review.

## 2021-12-11 ENCOUNTER — Other Ambulatory Visit: Payer: Self-pay | Admitting: Critical Care Medicine

## 2021-12-11 DIAGNOSIS — M4644 Discitis, unspecified, thoracic region: Secondary | ICD-10-CM

## 2021-12-13 ENCOUNTER — Other Ambulatory Visit: Payer: Self-pay

## 2021-12-13 ENCOUNTER — Encounter: Payer: Self-pay | Admitting: Critical Care Medicine

## 2021-12-13 ENCOUNTER — Ambulatory Visit: Payer: Managed Care, Other (non HMO) | Attending: Critical Care Medicine | Admitting: Critical Care Medicine

## 2021-12-13 DIAGNOSIS — R7989 Other specified abnormal findings of blood chemistry: Secondary | ICD-10-CM

## 2021-12-13 DIAGNOSIS — M4644 Discitis, unspecified, thoracic region: Secondary | ICD-10-CM | POA: Diagnosis not present

## 2021-12-13 DIAGNOSIS — M4624 Osteomyelitis of vertebra, thoracic region: Secondary | ICD-10-CM

## 2021-12-13 DIAGNOSIS — R635 Abnormal weight gain: Secondary | ICD-10-CM

## 2021-12-13 DIAGNOSIS — I10 Essential (primary) hypertension: Secondary | ICD-10-CM | POA: Diagnosis not present

## 2021-12-13 DIAGNOSIS — R0683 Snoring: Secondary | ICD-10-CM | POA: Insufficient documentation

## 2021-12-13 DIAGNOSIS — Z6841 Body Mass Index (BMI) 40.0 and over, adult: Secondary | ICD-10-CM

## 2021-12-13 MED ORDER — FUROSEMIDE 20 MG PO TABS: 20.0000 mg | ORAL_TABLET | Freq: Every day | ORAL | 2 refills | Status: DC

## 2021-12-13 MED ORDER — METHOCARBAMOL 500 MG PO TABS: 500.0000 mg | ORAL_TABLET | Freq: Four times a day (QID) | ORAL | 4 refills | Status: DC | PRN

## 2021-12-13 MED ORDER — GABAPENTIN 300 MG PO CAPS: 600.0000 mg | ORAL_CAPSULE | Freq: Two times a day (BID) | ORAL | 4 refills | Status: DC

## 2021-12-13 NOTE — Patient Instructions (Signed)
Follow the lifestyle medicine guidelines on diet, even if you eat a small meal you should eat three meals per day  Refills on medications prepared  Follow up with sleep study and neurosurgery when scheduled  Keep cardiology pharmacy appt follow up and labs   Return Dr Delford Field 4 months  Keep mammogram appt 12/15/21  Will call you on your colon cancer screen results

## 2021-12-13 NOTE — Assessment & Plan Note (Signed)
Patient with official diagnosis of hypertension patient is now on losartan 50 mg daily with good control blood pressure at this visit  Patient has an upcoming metabolic panel

## 2021-12-13 NOTE — Assessment & Plan Note (Signed)
Needs follow-up with neurosurgery this is pending continues with antibiotics per infectious disease

## 2021-12-13 NOTE — Progress Notes (Signed)
Established Patient Office Visit  Subjective:  Patient ID: Stacey Kaiser, female    DOB: Jun 27, 1971  Age: 51 y.o. MRN: [id number]  CC: No chief complaint on file.   HPI Stacey Kaiser presents for primary care follow-up.  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  Patient is yet to achieve an appointment with neurosurgery she has to pay off what bills she goes from her previous hospital consultations before they will see her.  Patient is going to receive a sleep study per cardiology.  She was started losartan at the last cardiology visit and potassium was stopped.  She has a IT consultant.  She has a referral to cardiology pharmacy clinic for consideration of Ozempic to help with weight loss.  On arrival blood pressure 128/82.  Patient states her level of dyspnea is improved since she has been on the losartan.  She still is not sleeping well at this time.  Patient only eats 1 meal a day and does not follow a healthy diet.  She did bring in her colon cancer screening kit at this visit.  She has a mammogram scheduled March 2.  She cannot tolerate to lay flat to get a Pap smear we will have to put this off for now.  Body weight continues to be at a mass index of 63 point  Past Medical History:  Diagnosis Date   Diskitis 01/17/2021   Elevated serum creatinine 03/24/2021   Family history of adverse reaction to anesthesia    " my mother had a seizure coming out of cabg surgery "   Morbid obesity ([place])    Morbid obesity with BMI of 50.0-59.9, adult ([place]) 01/17/2021   Scoliosis     Past Surgical History:  Procedure Laterality Date   IR FLUORO GUIDED NEEDLE PLC ASPIRATION/INJECTION LOC  01/18/2021   RADIOLOGY WITH ANESTHESIA N/A 01/18/2021   Procedure: IR WITH ANESTHESIA;  Surgeon: Radiologist, Medication, MD;   Location: [facility];  Service: Radiology;  Laterality: N/A;   WISDOM TOOTH EXTRACTION      Family History  Problem Relation Age of Onset   Heart failure Mother    Chronic Renal Failure Mother    Diabetes Mother     Social History   Socioeconomic History   Marital status: Soil scientist    Spouse name: Not on file   Number of children: 0   Years of education: Not on file   Highest education level: Not on file  Occupational History   Occupation: retail at [employer]  Tobacco Use   Smoking status: Never   Smokeless tobacco: Never  Vaping Use   Vaping Use: Never used  Substance and Sexual Activity   Alcohol use: Not Currently    Comment: rare   Drug use: No   Sexual activity: Not on file  Other Topics Concern   Not on file  Social History Narrative   Not on file   Social Determinants of Health   Financial Resource Strain: Medium Risk   Difficulty of Paying Living Expenses: Somewhat hard  Food Insecurity: No Food Insecurity   Worried About Charity fundraiser in the Last Year: Never true   Ran Out of Food in the Last Year: Never true  Transportation Needs: No Transportation Needs   Lack of Transportation (Medical): No   Lack of Transportation (Non-Medical): No  Physical Activity:  Not on file  Stress: Stress Concern Present   Feeling of Stress : To some extent  Social Connections: Not on file  Intimate Partner Violence: Not on file    Outpatient Medications Prior to Visit  Medication Sig Dispense Refill   cefadroxil (DURICEF) 500 MG capsule Take 1 capsule (500 mg total) by mouth 2 (two) times daily. 60 capsule 0   Coenzyme Q10 (CO Q 10 PO) Take 1 tablet by mouth daily. gummy     Cyanocobalamin (VITAMIN B-12 PO) Take 1 tablet by mouth daily. gummy     doxycycline (VIBRA-TABS) 100 MG tablet Take 1 tablet (100 mg total) by mouth 2 (two) times daily. 60 tablet 0   ibuprofen (ADVIL) 200 MG tablet Take 200 mg by mouth every 6 (six) hours as needed.     losartan  (COZAAR) 50 MG tablet Take 1 tablet (50 mg total) by mouth daily. 90 tablet 3   Multiple Vitamins-Minerals (HAIR SKIN AND NAILS FORMULA PO) Take 1 tablet by mouth daily.     Multiple Vitamins-Minerals (ONE-A-DAY WOMENS PETITES PO) Take 1 tablet by mouth daily.     Omega-3 Fatty Acids (OMEGA-3 FISH OIL PO) Take 1 capsule by mouth daily.     OVER THE COUNTER MEDICATION Take 1 tablet by mouth daily. Power c gummy     furosemide (LASIX) 20 MG tablet Take 1 tablet (20 mg total) by mouth daily. 30 tablet 2   gabapentin (NEURONTIN) 300 MG capsule Take 2 capsules by mouth twice daily 60 capsule 0   methocarbamol (ROBAXIN) 500 MG tablet Take 1 tablet (500 mg total) by mouth every 6 (six) hours as needed for muscle spasms. 60 tablet 4   albuterol (VENTOLIN HFA) 108 (90 Base) MCG/ACT inhaler Inhale 2 puffs into the lungs every 4 (four) hours as needed for wheezing or shortness of breath.     No facility-administered medications prior to visit.    Allergies  Allergen Reactions   Okra Hives   Other     Bees and wasps    ROS Review of Systems  Constitutional:  Positive for fatigue and unexpected weight change.  HENT: Negative.  Negative for ear pain, postnasal drip, rhinorrhea, sinus pressure, sore throat, trouble swallowing and voice change.   Eyes: Negative.   Respiratory:  Positive for shortness of breath. Negative for apnea, cough, choking, chest tightness, wheezing and stridor.   Cardiovascular: Negative.  Negative for chest pain, palpitations and leg swelling.  Gastrointestinal: Negative.  Negative for abdominal distention, abdominal pain, nausea and vomiting.  Genitourinary: Negative.   Musculoskeletal:  Positive for back pain. Negative for arthralgias and myalgias.  Skin: Negative.  Negative for rash.  Allergic/Immunologic: Negative.  Negative for environmental allergies and food allergies.  Neurological: Negative.  Negative for dizziness, syncope, weakness and headaches.  Hematological:  Negative.  Negative for adenopathy. Does not bruise/bleed easily.  Psychiatric/Behavioral:  Positive for sleep disturbance. Negative for agitation and suicidal ideas. The patient is not nervous/anxious.      Objective:    Physical Exam Vitals reviewed.  Constitutional:      Appearance: Normal appearance. She is well-developed. She is obese. She is not diaphoretic.     Comments: Morbidly obese  HENT:     Head: Normocephalic and atraumatic.     Nose: Nose normal. No nasal deformity, septal deviation, mucosal edema or rhinorrhea.     Right Sinus: No maxillary sinus tenderness or frontal sinus tenderness.     Left Sinus: No maxillary sinus tenderness  or frontal sinus tenderness.     Mouth/Throat:     Mouth: Mucous membranes are moist.     Pharynx: Oropharynx is clear. No oropharyngeal exudate.  Eyes:     General: No scleral icterus.    Conjunctiva/sclera: Conjunctivae normal.     Pupils: Pupils are equal, round, and reactive to light.  Neck:     Thyroid: No thyromegaly.     Vascular: No carotid bruit or JVD.     Trachea: Trachea normal. No tracheal tenderness or tracheal deviation.  Cardiovascular:     Rate and Rhythm: Normal rate and regular rhythm.     Chest Wall: PMI is not displaced.     Pulses: Normal pulses. No decreased pulses.     Heart sounds: Normal heart sounds, S1 normal and S2 normal. Heart sounds not distant. No murmur heard. No systolic murmur is present.  No diastolic murmur is present.    No friction rub. No gallop. No S3 or S4 sounds.  Pulmonary:     Effort: Pulmonary effort is normal. No tachypnea, accessory muscle usage or respiratory distress.     Breath sounds: Normal breath sounds. No stridor. No decreased breath sounds, wheezing, rhonchi or rales.  Chest:     Chest wall: No tenderness.  Abdominal:     General: Bowel sounds are normal. There is no distension.     Palpations: Abdomen is soft. Abdomen is not rigid.     Tenderness: There is no abdominal  tenderness. There is no guarding or rebound.  Musculoskeletal:        General: Normal range of motion.     Cervical back: Normal range of motion and neck supple. No edema, erythema or rigidity. No muscular tenderness. Normal range of motion.  Lymphadenopathy:     Head:     Right side of head: No submental or submandibular adenopathy.     Left side of head: No submental or submandibular adenopathy.     Cervical: No cervical adenopathy.  Skin:    General: Skin is warm and dry.     Coloration: Skin is not pale.     Findings: No rash.     Nails: There is no clubbing.  Neurological:     General: No focal deficit present.     Mental Status: She is alert and oriented to person, place, and time.     Sensory: No sensory deficit.  Psychiatric:        Mood and Affect: Mood normal.        Speech: Speech normal.        Behavior: Behavior normal.        Thought Content: Thought content normal.    BP 128/82    Pulse 71    Wt 295 lb 12.8 oz (134.2 kg)    SpO2 96%    BMI 64.01 kg/m  Wt Readings from Last 3 Encounters:  12/13/21 295 lb 12.8 oz (134.2 kg)  12/07/21 294 lb 6.4 oz (133.5 kg)  09/13/21 285 lb 6.4 oz (129.5 kg)     Health Maintenance Due  Topic Date Due   COLON CANCER SCREENING ANNUAL FOBT  Never done   MAMMOGRAM  Never done   Zoster Vaccines- Shingrix (1 of 2) Never done   COVID-19 Vaccine (4 - Booster for Pfizer series) 09/18/2020    There are no preventive care reminders to display for this patient.  Lab Results  Component Value Date   TSH 3.660 02/24/2021   Lab Results  Component  Value Date   WBC 8.0 03/24/2021   HGB 14.1 03/24/2021   HCT 42.0 03/24/2021   MCV 87.7 03/24/2021   PLT 364 03/24/2021   Lab Results  Component Value Date   NA 140 12/07/2021   K 4.4 12/07/2021   CO2 25 12/07/2021   GLUCOSE 108 (H) 12/07/2021   BUN 12 12/07/2021   CREATININE 0.96 12/07/2021   BILITOT 0.4 02/28/2021   ALKPHOS 68 02/28/2021   AST 21 02/28/2021   ALT 13  02/28/2021   PROT 6.6 02/28/2021   ALBUMIN 3.8 02/28/2021   CALCIUM 9.6 12/07/2021   ANIONGAP 10 02/28/2021   EGFR 72 12/07/2021   No results found for: CHOL No results found for: HDL No results found for: LDLCALC No results found for: TRIG No results found for: CHOLHDL Lab Results  Component Value Date   HGBA1C 5.7 (H) 01/17/2021      Assessment & Plan:   Problem List Items Addressed This Visit       Cardiovascular and Mediastinum   Hypertension    Patient with official diagnosis of hypertension patient is now on losartan 50 mg daily with good control blood pressure at this visit  Patient has an upcoming metabolic panel      Relevant Medications   furosemide (LASIX) 20 MG tablet     Musculoskeletal and Integument   Osteomyelitis of thoracic vertebra (HCC)    Needs follow-up with neurosurgery this is pending continues with antibiotics per infectious disease      Relevant Medications   gabapentin (NEURONTIN) 300 MG capsule   methocarbamol (ROBAXIN) 500 MG tablet   Discitis of thoracic region   Relevant Medications   gabapentin (NEURONTIN) 300 MG capsule   methocarbamol (ROBAXIN) 500 MG tablet     Other   BMI 60.0-69.9, adult ([place])    Patient has significant comorbidity with probable sleep apnea hypertension and right heart  pressure elevations sleep apnea likely  Plan is patient will continue losartan  Sleep study is being scheduled per cardiology  I went over with the patient and spent about 10 minutes with her on food is medicine lifestyle medicine education on a plant-based diet she took this under advisement also recommend she begin the simple walking program on level paved ground about 10 minutes 3-4 times a week until she gets in with neurosurgery to assess her spine further      Snoring    Snoring with fatigue likely sleep apnea sleep study pending      Other Visit Diagnoses     Elevated brain natriuretic peptide (BNP) level       Relevant  Medications   furosemide (LASIX) 20 MG tablet   Weight gain       Relevant Medications   furosemide (LASIX) 20 MG tablet       Meds ordered this encounter  Medications   furosemide (LASIX) 20 MG tablet    Sig: Take 1 tablet (20 mg total) by mouth daily.    Dispense:  30 tablet    Refill:  2   gabapentin (NEURONTIN) 300 MG capsule    Sig: Take 2 capsules (600 mg total) by mouth 2 (two) times daily.    Dispense:  60 capsule    Refill:  4    Future refill   methocarbamol (ROBAXIN) 500 MG tablet    Sig: Take 1 tablet (500 mg total) by mouth every 6 (six) hours as needed for muscle spasms.    Dispense:  60 tablet  Refill:  4  Patient will have mammogram on March 2 we will postpone Pap smear because of the back pain we will refill all medications and patient did process her fecal occult kit today we will see the results 38 minutes spent complex patient high complexity decision making need for patient education on lifestyle food is medicine Follow-up: Return in about 4 months (around 04/12/2022).    Asencion Noble, MD

## 2021-12-13 NOTE — Assessment & Plan Note (Signed)
Snoring with fatigue likely sleep apnea sleep study pending

## 2021-12-13 NOTE — Assessment & Plan Note (Signed)
Patient has significant comorbidity with probable sleep apnea hypertension and right heart  pressure elevations sleep apnea likely  Plan is patient will continue losartan  Sleep study is being scheduled per cardiology  I went over with the patient and spent about 10 minutes with her on food is medicine lifestyle medicine education on a plant-based diet she took this under advisement also recommend she begin the simple walking program on level paved ground about 10 minutes 3-4 times a week until she gets in with neurosurgery to assess her spine further

## 2021-12-14 LAB — FECAL OCCULT BLOOD, IMMUNOCHEMICAL: Fecal Occult Bld: NEGATIVE

## 2021-12-15 ENCOUNTER — Telehealth: Payer: Self-pay

## 2021-12-15 ENCOUNTER — Ambulatory Visit
Admission: RE | Admit: 2021-12-15 | Discharge: 2021-12-15 | Disposition: A | Discharge status: Home / Self Care | Payer: Managed Care, Other (non HMO) | Source: Ambulatory Visit | Attending: Critical Care Medicine | Admitting: Critical Care Medicine

## 2021-12-15 DIAGNOSIS — Z1231 Encounter for screening mammogram for malignant neoplasm of breast: Secondary | ICD-10-CM

## 2021-12-15 IMAGING — MG MM DIGITAL SCREENING BILAT W/ TOMO AND CAD
8 of 14 series · 8 of 40 positions shown · non-contrast
Comparison: None.

CLINICAL DATA: Screening.

EXAM:
DIGITAL SCREENING BILATERAL MAMMOGRAM WITH TOMOSYNTHESIS AND CAD
TECHNIQUE: Bilateral screening digital craniocaudal and mediolateral oblique
mammograms were obtained. Bilateral screening digital breast
tomosynthesis was performed. The images were evaluated with
computer-aided detection.

[R MLO synth-2D (1 of 2)]
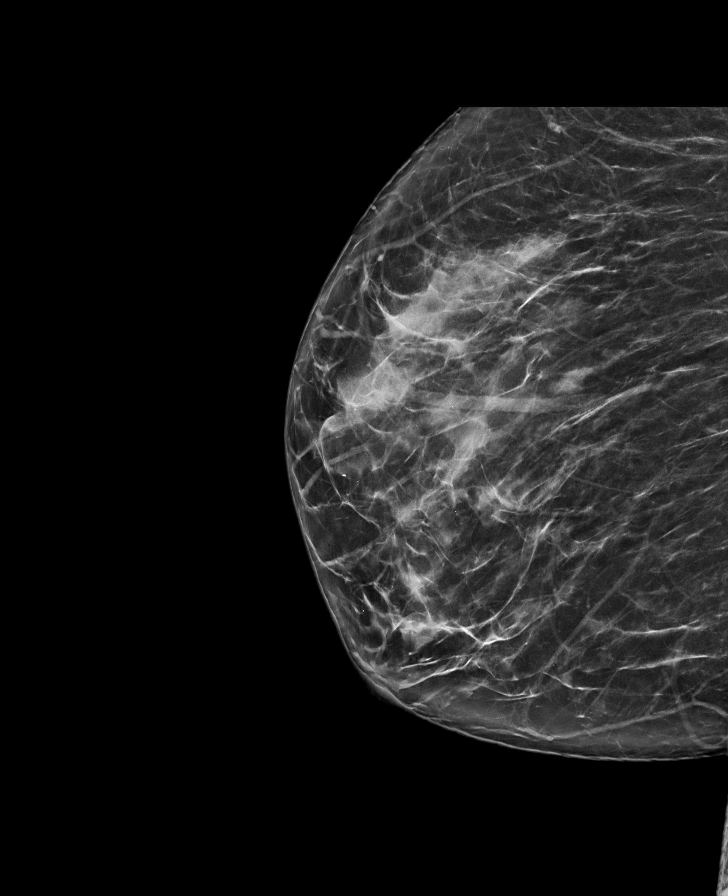

[R MLO synth-2D (2 of 2)]
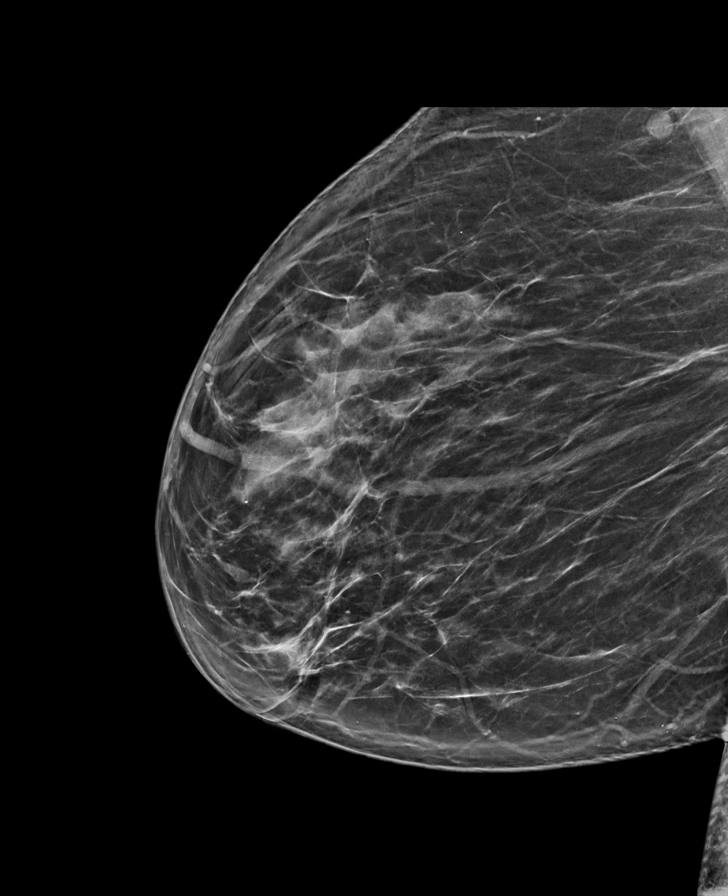

[L CC synth-2D]
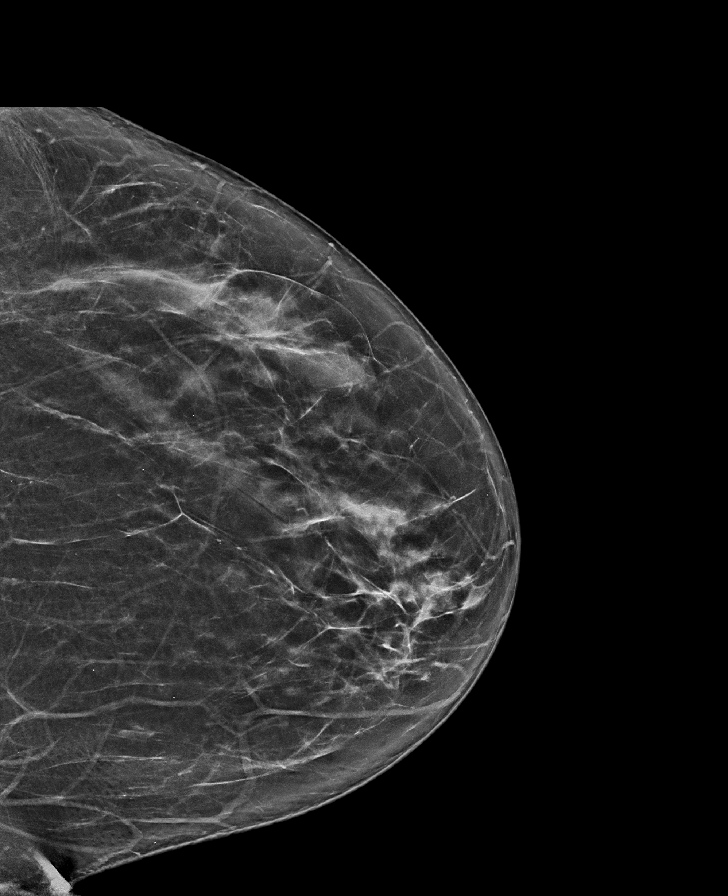

[R CV synth-2D]
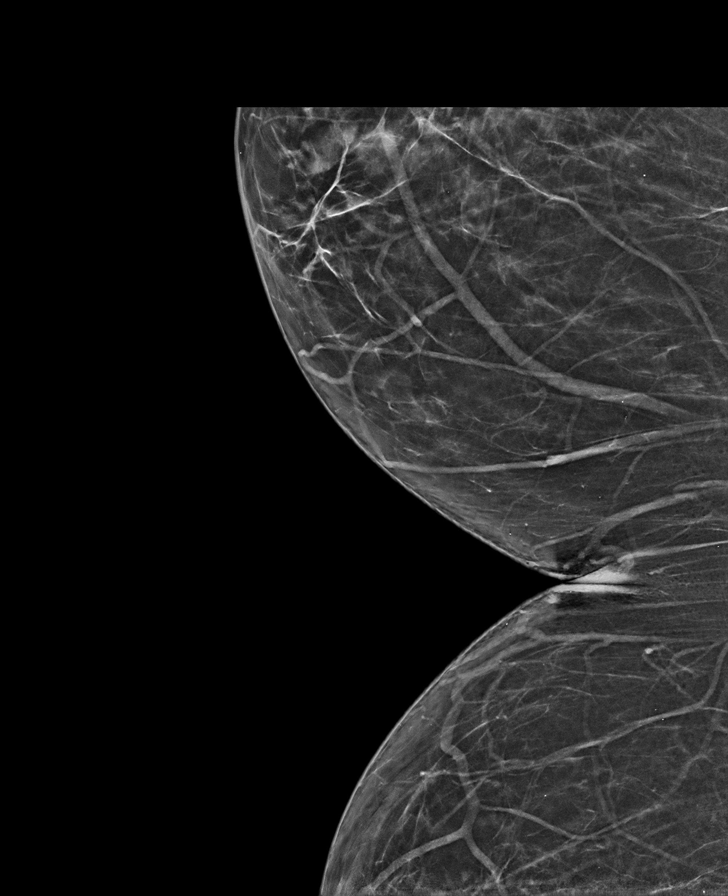

[R CC synth-2D]
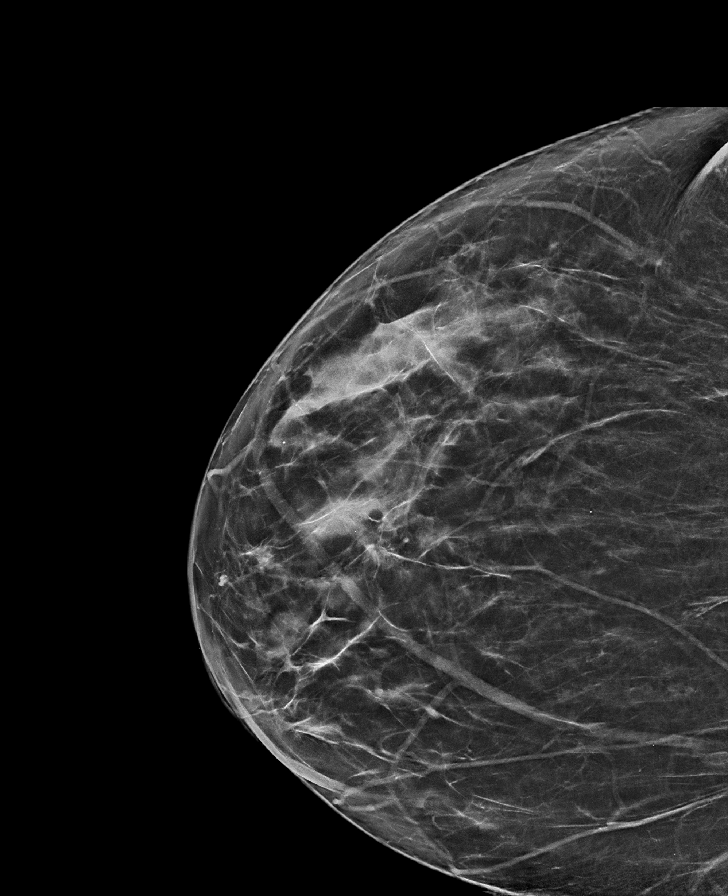

[L MLO synth-2D (1 of 2)]
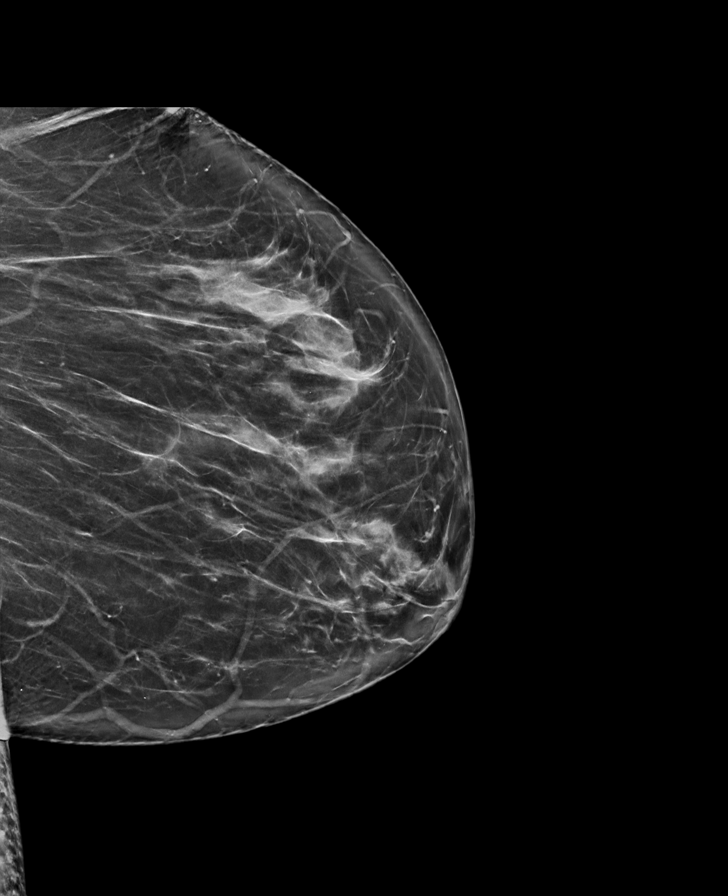

[L MLO synth-2D (2 of 2)]
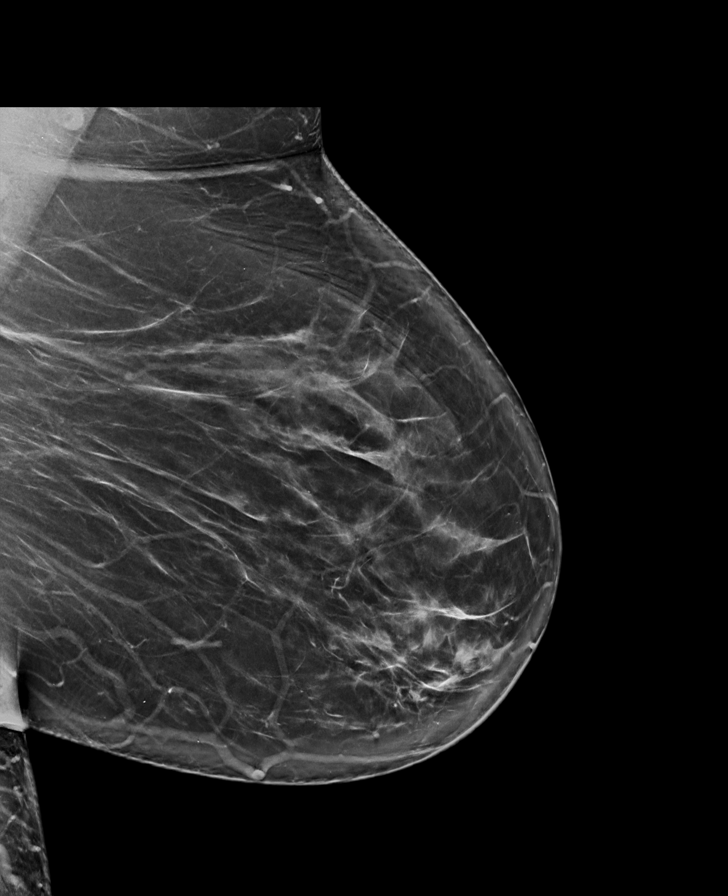

[L MLO tomo · tomo slice 44/87.0]
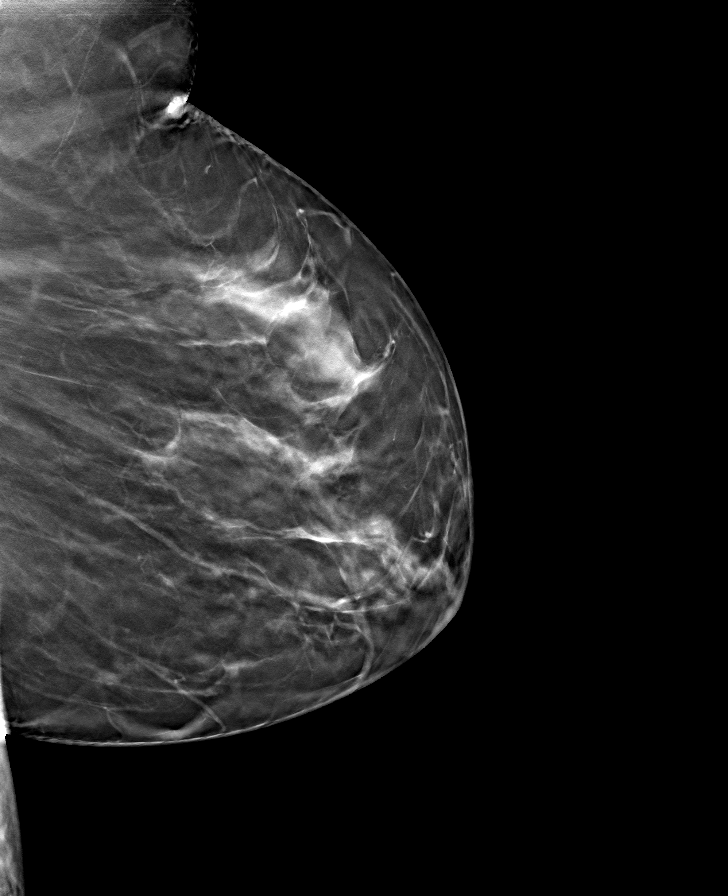

[8 of 40 positions shown; findings below may reference images not displayed]

ACR Breast Density Category b: There are scattered areas of
fibroglandular density.
FINDINGS: In the right breast a possible mass requires further evaluation.

In the left breast a possible mass requires further evaluation.
IMPRESSION: Further evaluation is suggested for possible mass in the right
breast.

Further evaluation is suggested for possible mass in the left
breast.

RECOMMENDATION:
Diagnostic mammogram and possibly ultrasound of both breasts.
(Code:[VE])

The patient will be contacted regarding the findings, and additional
imaging will be scheduled.

BI-RADS CATEGORY  0: Incomplete. Need additional imaging evaluation
and/or prior mammograms for comparison.

## 2021-12-15 NOTE — Telephone Encounter (Signed)
-----   Message from Elsie Stain, MD sent at 12/15/2021  6:42 AM EST ----- ?let pt know fecal occult is NEGATIVE  no colon cancer recheck one year  ?

## 2021-12-15 NOTE — Telephone Encounter (Signed)
Pt was called and vm was left, Information has been sent to nurse pool.   

## 2021-12-16 ENCOUNTER — Ambulatory Visit: Payer: Managed Care, Other (non HMO) | Admitting: Pharmacist

## 2021-12-16 ENCOUNTER — Other Ambulatory Visit: Payer: Self-pay

## 2021-12-16 ENCOUNTER — Other Ambulatory Visit: Payer: Self-pay | Admitting: Critical Care Medicine

## 2021-12-16 VITALS — BP 151/99 | Wt 297.2 lb

## 2021-12-16 DIAGNOSIS — Z6841 Body Mass Index (BMI) 40.0 and over, adult: Secondary | ICD-10-CM | POA: Diagnosis not present

## 2021-12-16 DIAGNOSIS — R0602 Shortness of breath: Secondary | ICD-10-CM

## 2021-12-16 DIAGNOSIS — R928 Other abnormal and inconclusive findings on diagnostic imaging of breast: Secondary | ICD-10-CM

## 2021-12-16 DIAGNOSIS — I10 Essential (primary) hypertension: Secondary | ICD-10-CM | POA: Diagnosis not present

## 2021-12-16 MED ORDER — WEGOVY 0.25 MG/0.5ML ~~LOC~~ SOAJ: 0.2500 mg | SUBCUTANEOUS | 0 refills | Status: DC

## 2021-12-16 MED ORDER — WEGOVY 0.5 MG/0.5ML ~~LOC~~ SOAJ: 0.5000 mg | SUBCUTANEOUS | 0 refills | Status: DC

## 2021-12-16 NOTE — Progress Notes (Signed)
Patient ID: Stacey Kaiser                 DOB: 1971/04/14                    MRN: [id number] ? ? ? ? ?HPI: ?Stacey Kaiser is a 51 y.o. female patient referred to pharmacy clinic by Dr Bjorn Pippin to initiate weight loss therapy with GLP1-RA. PMH is significant for obesity complicated by chronic medical conditions including HTN, osteomyelitis, obesity and pre DM. Most recent BMI 64.31. ? ?Patient presents today with boyfriend.  Reports she has never been this heavy which is complicated by her injured back. Has a history of back fracture. Has to use a walker to get around. 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 ? ?Has a family history of T2DM but as of right now is pre diabetic with A1c 5.7 in April 2022.  Will update A1c today. ? ?Does not follow a heart healthy diet.  Reports she drinks a lot of sugary beverages such as sodas.   ? ? ?Labs: ?Lab Results  ?Component Value Date  ? HGBA1C 5.7 (H) 01/17/2021  ? ? ?Wt Readings from Last 1 Encounters:  ?12/13/21 295 lb 12.8 oz (134.2 kg)  ? ? ?BP Readings from Last 1 Encounters:  ?12/13/21 128/82  ? ?Pulse Readings from Last 1 Encounters:  ?12/13/21 71  ? ? ?No results found for: CHOL, TRIG, HDL, CHOLHDL, VLDL, LDLCALC, LDLDIRECT ? ?Past Medical History:  ?Diagnosis Date  ? Diskitis 01/17/2021  ? Elevated serum creatinine 03/24/2021  ? Family history of adverse reaction to anesthesia   ? " my mother had a seizure coming out of cabg surgery "  ? Morbid obesity ([facility])   ? Morbid obesity with BMI of 50.0-59.9, adult ([facility]) 01/17/2021  ? Scoliosis   ? ? ?Current Outpatient Medications on File Prior to Visit  ?Medication Sig Dispense Refill  ? albuterol (VENTOLIN HFA) 108 (90 Base) MCG/ACT inhaler Inhale 2 puffs into the lungs every 4 (four) hours as needed for wheezing or shortness of breath.    ? cefadroxil (DURICEF) 500 MG capsule Take 1 capsule (500 mg total) by mouth 2 (two) times daily. 60 capsule 0  ?  Coenzyme Q10 (CO Q 10 PO) Take 1 tablet by mouth daily. gummy    ? Cyanocobalamin (VITAMIN B-12 PO) Take 1 tablet by mouth daily. gummy    ? doxycycline (VIBRA-TABS) 100 MG tablet Take 1 tablet (100 mg total) by mouth 2 (two) times daily. 60 tablet 0  ? furosemide (LASIX) 20 MG tablet Take 1 tablet (20 mg total) by mouth daily. 30 tablet 2  ? gabapentin (NEURONTIN) 300 MG capsule Take 2 capsules (600 mg total) by mouth 2 (two) times daily. 60 capsule 4  ? ibuprofen (ADVIL) 200 MG tablet Take 200 mg by mouth every 6 (six) hours as needed.    ? losartan (COZAAR) 50 MG tablet Take 1 tablet (50 mg total) by mouth daily. 90 tablet 3  ? methocarbamol (ROBAXIN) 500 MG tablet Take 1 tablet (500 mg total) by mouth every 6 (six) hours as needed for muscle spasms. 60 tablet 4  ? Multiple Vitamins-Minerals (HAIR SKIN AND NAILS FORMULA PO) Take 1 tablet by mouth daily.    ? Multiple Vitamins-Minerals (ONE-A-DAY WOMENS PETITES PO) Take 1 tablet by mouth daily.    ? Omega-3 Fatty Acids (OMEGA-3 FISH OIL PO) Take 1 capsule by mouth  daily.    ? OVER THE COUNTER MEDICATION Take 1 tablet by mouth daily. Power c gummy    ? ?No current facility-administered medications on file prior to visit.  ? ? ?Allergies  ?Allergen Reactions  ? Okra Hives  ? Other   ?  Bees and wasps  ? ? ? ?Assessment/Plan: ? ?1. Weight loss -  ? ?Discussed healthy diet in detail.  Using plate method and Planning Healthy Meals handout, educated on increasing nonstarchy vegetables, lean proteins, and healthy fats while reducing carbohydrates and eliminating simple carbohydrates such as soda. ? ?Will start Wegovy. Confirmed patient not pregnant and no personal or family history of medullary thyroid carcinoma (MTC) or Multiple Endocrine Neoplasia syndrome type 2 (MEN 2).  ? ?Advised patient on common side effects including nausea, diarrhea, dyspepsia, decreased appetite, and fatigue. Counseled patient on reducing meal size and how to titrate medication to minimize  side effects. Counseled patient to call if intolerable side effects or if experiencing dehydration, abdominal pain, or dizziness. Patient will adhere to dietary modifications and will target at least 150 minutes of moderate intensity exercise weekly.  ? ?Gave patient Reginal Lutes 0.25mg  one month sample.  Completed PA for [facility] which stated no PA required. Encouraged patient to go to company website and download copay card. ? ?Titration Plan:  ?Will plan to follow the titration plan as below, pending patient is tolerating each dose before increasing to the next. Can slow titration if needed for tolerability.  ?  ?-Month 1: Inject 0.25 SQ once weekly x 4 weeks ?-Month 2: Inject 0.5 SQ once weekly x 4 weeks ?-Month 3: Inject 1 SQ once weekly x 4 weeks ?-Month 4+: Inject 1.7 SQ once weekly  ? ?Follow up in 3 months. ? ?Start [facility] 0.25mg  sq q 7 days ?Recheck in 90 days ? ?

## 2021-12-16 NOTE — Patient Instructions (Addendum)
It was nice meeting you today ? ?We would like you to keep your blood pressure less than 130/80 ? ?Please follow the planning healthy meals handout ? ?We will start you on a new medication called Wegovy.  You will inject one pen (0.25mg ) once a week for 4 weeks.  Then increase to 0.5mg  once a week ? ?I will see you back in 3 months ? ?Laural Golden, PharmD, BCACP, CDCES, CPP ?[address], [address] ?[place], [place], [address] ?Phone: [phone], Fax: [phone]  ? ? ? ?

## 2021-12-18 ENCOUNTER — Encounter: Payer: Self-pay | Admitting: Pharmacist

## 2021-12-19 ENCOUNTER — Telehealth: Payer: Self-pay

## 2021-12-19 LAB — BASIC METABOLIC PANEL
BUN/Creatinine Ratio: 9 (ref 9–23)
BUN: 7 mg/dL (ref 6–24)
CO2: 19 mmol/L — ABNORMAL LOW (ref 20–29)
Calcium: 9 mg/dL (ref 8.7–10.2)
Chloride: 104 mmol/L (ref 96–106)
Creatinine, Ser: 0.81 mg/dL (ref 0.57–1.00)
Glucose: 99 mg/dL (ref 70–99)
Potassium: 4.3 mmol/L (ref 3.5–5.2)
Sodium: 140 mmol/L (ref 134–144)
eGFR: 88 mL/min/{1.73_m2} (ref >59)

## 2021-12-19 LAB — HEMOGLOBIN A1C
Est. average glucose Bld gHb Est-mCnc: 123 mg/dL
Hgb A1c MFr Bld: 5.9 % — ABNORMAL HIGH (ref 4.8–5.6)

## 2021-12-19 NOTE — Telephone Encounter (Signed)
Pt called back and did see her results on mychart. ?

## 2021-12-19 NOTE — Telephone Encounter (Signed)
Pt was called and vm was left, Information has been sent to nurse pool.   

## 2021-12-19 NOTE — Telephone Encounter (Signed)
-----   Message from Elsie Stain, MD sent at 12/16/2021  5:01 PM EST ----- ?Make sure pt knows her mammogram was abnormal, they found some areas on both breasts that need further imaging, the imaging center will call her and will order  ?

## 2021-12-19 NOTE — Telephone Encounter (Signed)
Medication was filled 12/13/2021 ?

## 2021-12-20 ENCOUNTER — Telehealth: Payer: Self-pay | Admitting: Pharmacist

## 2021-12-20 DIAGNOSIS — I10 Essential (primary) hypertension: Secondary | ICD-10-CM

## 2021-12-20 DIAGNOSIS — Z6841 Body Mass Index (BMI) 40.0 and over, adult: Secondary | ICD-10-CM

## 2021-12-20 NOTE — Telephone Encounter (Signed)
Called and spoke to pharmacy and they stated that the price was $1,618.82. they believe she has a deductible to meet. She will need to contact her insurance and ask  ?

## 2021-12-20 NOTE — Telephone Encounter (Signed)
Can you call patient's pharmacy to see why patient's [facility] copay is high?  Does she have a deductible?  Was approved by her [employer] plan and has copay card ?

## 2021-12-21 MED ORDER — OZEMPIC (0.25 OR 0.5 MG/DOSE) 2 MG/1.5ML ~~LOC~~ SOPN: 0.5000 mg | PEN_INJECTOR | SUBCUTANEOUS | 0 refills | Status: DC

## 2021-12-21 NOTE — Telephone Encounter (Signed)
Called patient and LMOM

## 2021-12-21 NOTE — Addendum Note (Signed)
Addended by: Cheree Ditto on: 12/21/2021 11:09 AM ? ? Modules accepted: Orders ? ?

## 2021-12-21 NOTE — Telephone Encounter (Addendum)
Patient called back. Advised there was no patient assistance available for [facility].  Advised I would see if Ozempic was covered ?

## 2021-12-26 ENCOUNTER — Encounter: Payer: Self-pay | Admitting: Cardiology

## 2021-12-26 DIAGNOSIS — I10 Essential (primary) hypertension: Secondary | ICD-10-CM

## 2021-12-28 MED ORDER — LOSARTAN POTASSIUM 100 MG PO TABS: 100.0000 mg | ORAL_TABLET | Freq: Every day | ORAL | 3 refills | Status: DC

## 2021-12-28 NOTE — Telephone Encounter (Signed)
Recommend increase losartan to 100 mg daily and check BMET in 1 week ?

## 2022-01-10 ENCOUNTER — Ambulatory Visit
Admission: RE | Admit: 2022-01-10 | Discharge: 2022-01-10 | Disposition: A | Discharge status: Home / Self Care | Payer: Managed Care, Other (non HMO) | Source: Ambulatory Visit | Attending: Critical Care Medicine | Admitting: Critical Care Medicine

## 2022-01-10 ENCOUNTER — Other Ambulatory Visit: Payer: Self-pay | Admitting: Critical Care Medicine

## 2022-01-10 DIAGNOSIS — N6310 Unspecified lump in the right breast, unspecified quadrant: Secondary | ICD-10-CM

## 2022-01-10 IMAGING — US US BREAST*L* LIMITED INC AXILLA
1 series · 10 of 10 positions shown · non-contrast
Comparison: None.
COMPARISON: The patient had a screening mammogram [DATE] which
is the only comparison available.

*** End of Addendum ***
COMPARISON: None.

Addendum:
CLINICAL DATA: Bilateral screening recall for possible masses.



[Series 1: us breast*left* limited inc axilla · 0.07mm/px · 10 of 10 slices shown]
[im 1/10]
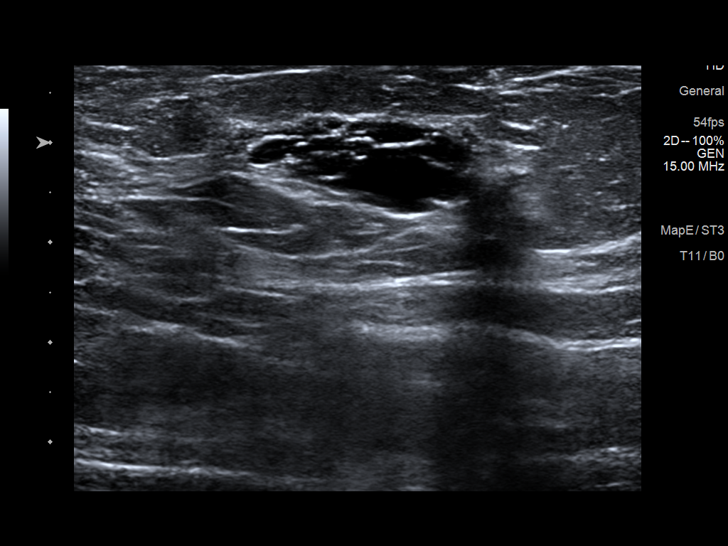
[im 2/10]
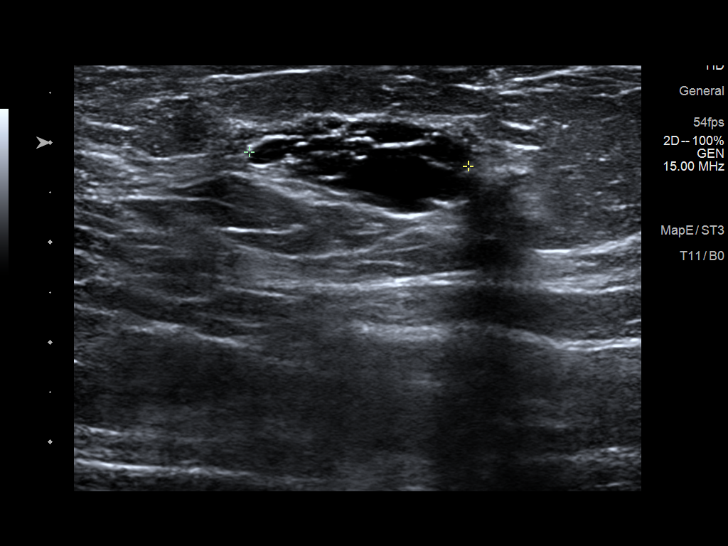
[im 3/10]
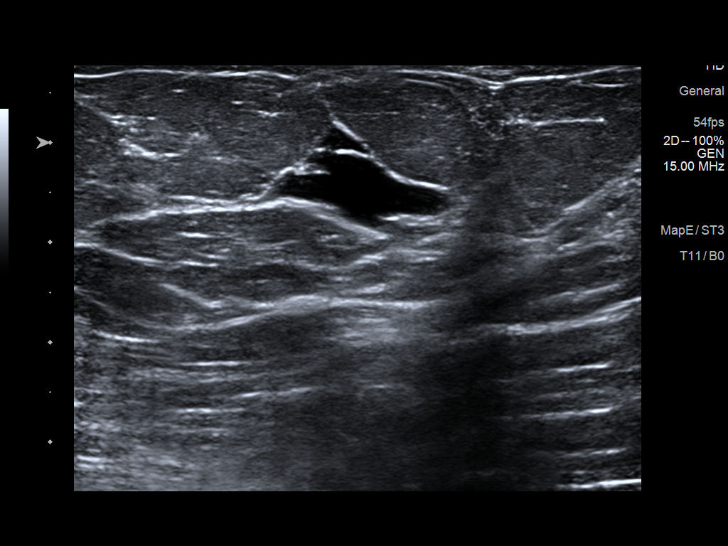
[im 4/10]
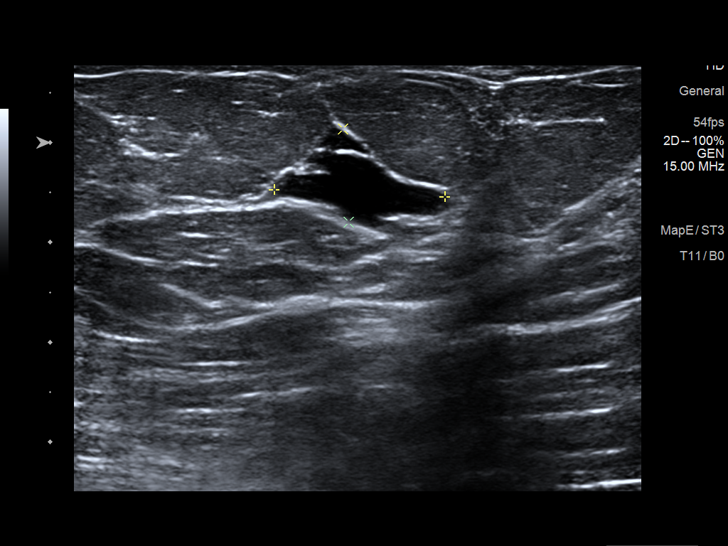
[im 5/10]
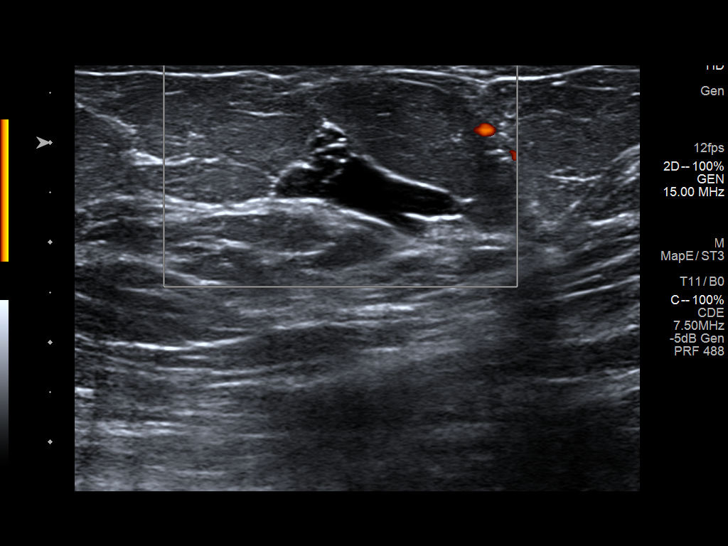
[im 6/10]
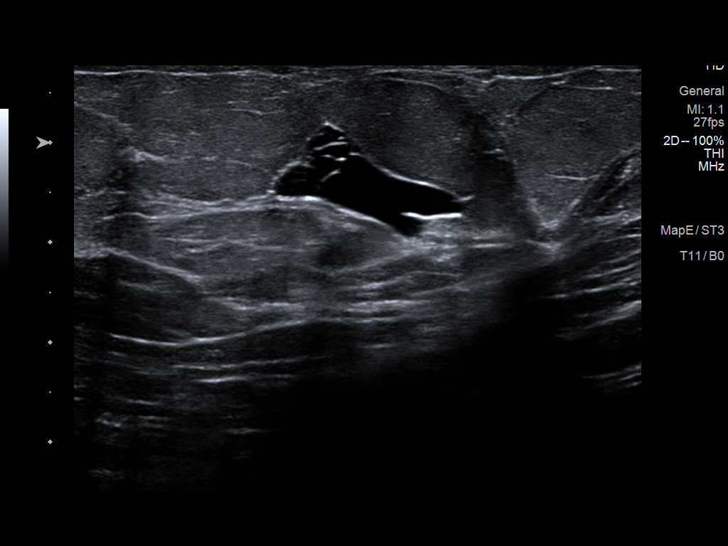
[im 7/10]
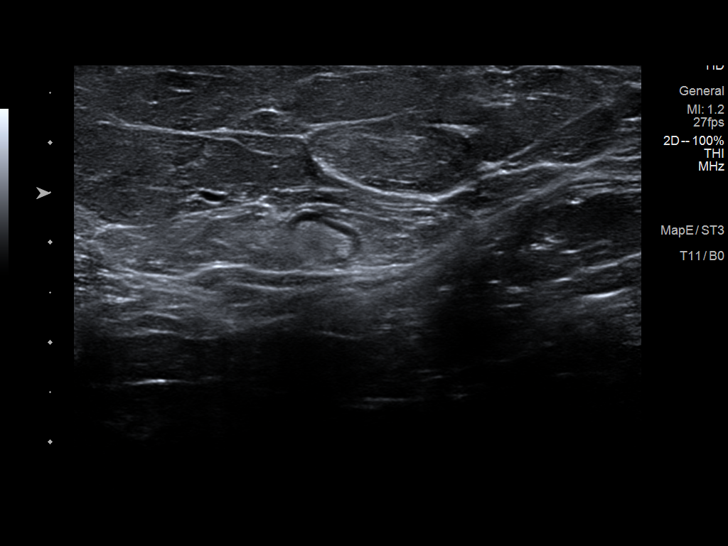
[im 8/10]
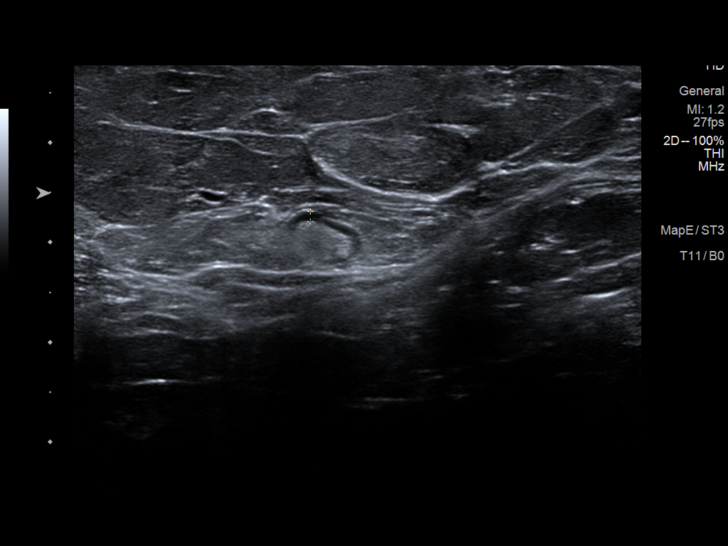
[im 9/10]
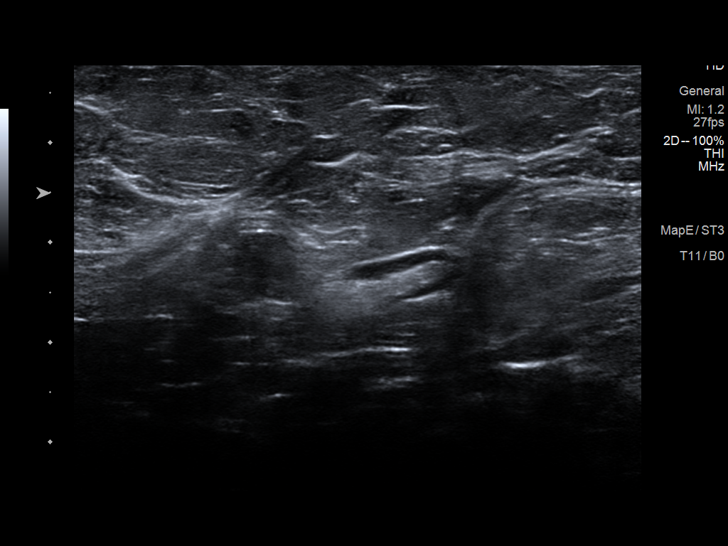
[im 10/10]
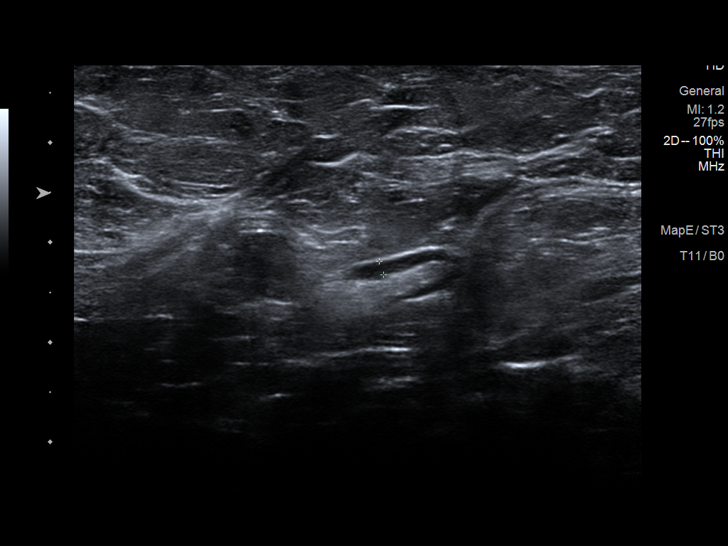

[10 of 10 positions shown; findings below may reference images not displayed]

ACR Breast Density Category b: There are scattered areas of
fibroglandular density.
FINDINGS: Spot compression tomosynthesis images through the anterior right
breast demonstrates an irregular mass in the superior slightly
lateral right breast. There is an oval obscured mass in the
retroareolar right breast.

Spot compression tomosynthesis images of the upper-outer left breast
demonstrates an obscured oval mass in the anterior depth.

Ultrasound of the right breast at 11 o'clock, 1 cm from the nipple
demonstrates an anechoic oval circumscribed mass measuring 1.0 x
x 0.8 cm.

In the right breast at 11 o'clock, 6 cm from the nipple there is a
cluster of cysts measuring all together 2.5 x 0.7 x 1.4 cm.

Ultrasound of the left breast at 1 o'clock, 7 cm from the nipple
demonstrates a cluster of anechoic cysts measuring 2.2 x 0.9 x
cm.
IMPRESSION: 1. There is a likely benign cluster of cysts in the right breast at
11 o'clock measuring 2.5 cm.

2.  Benign bilateral fibrocystic changes.

RECOMMENDATION:
Six-month follow-up diagnostic right breast mammogram and ultrasound
for the likely benign cluster of cysts at 11 o'clock in the right
breast.

I have discussed the findings and recommendations with the patient.
If applicable, a reminder letter will be sent to the patient
regarding the next appointment.

BI-RADS CATEGORY  3: Probably benign.

ADDENDUM:
Correction to the
ACR Breast Density Category b: There are scattered areas of
fibroglandular density.
FINDINGS: Spot compression tomosynthesis images through the anterior right
breast demonstrates an irregular mass in the superior slightly
lateral right breast. There is an oval obscured mass in the
retroareolar right breast.

Spot compression tomosynthesis images of the upper-outer left breast
demonstrates an obscured oval mass in the anterior depth.

Ultrasound of the right breast at 11 o'clock, 1 cm from the nipple
demonstrates an anechoic oval circumscribed mass measuring 1.0 x
x 0.8 cm.

In the right breast at 11 o'clock, 6 cm from the nipple there is a
cluster of cysts measuring all together 2.5 x 0.7 x 1.4 cm.

Ultrasound of the left breast at 1 o'clock, 7 cm from the nipple
demonstrates a cluster of anechoic cysts measuring 2.2 x 0.9 x
cm.
IMPRESSION: 1. There is a likely benign cluster of cysts in the right breast at
11 o'clock measuring 2.5 cm.

2.  Benign bilateral fibrocystic changes.

RECOMMENDATION:
Six-month follow-up diagnostic right breast mammogram and ultrasound
for the likely benign cluster of cysts at 11 o'clock in the right
breast.

I have discussed the findings and recommendations with the patient.
If applicable, a reminder letter will be sent to the patient
regarding the next appointment.

BI-RADS CATEGORY  3: Probably benign.

## 2022-01-10 IMAGING — MG DIGITAL DIAGNOSTIC BILAT W/ TOMO W/ CAD
8 of 14 series · 8 of 40 positions shown · non-contrast
Comparison: None.
COMPARISON: The patient had a screening mammogram [DATE] which
is the only comparison available.

*** End of Addendum ***
COMPARISON: None.

Addendum:
CLINICAL DATA: Bilateral screening recall for possible masses.



[R MLO synth-2D (1 of 2)]
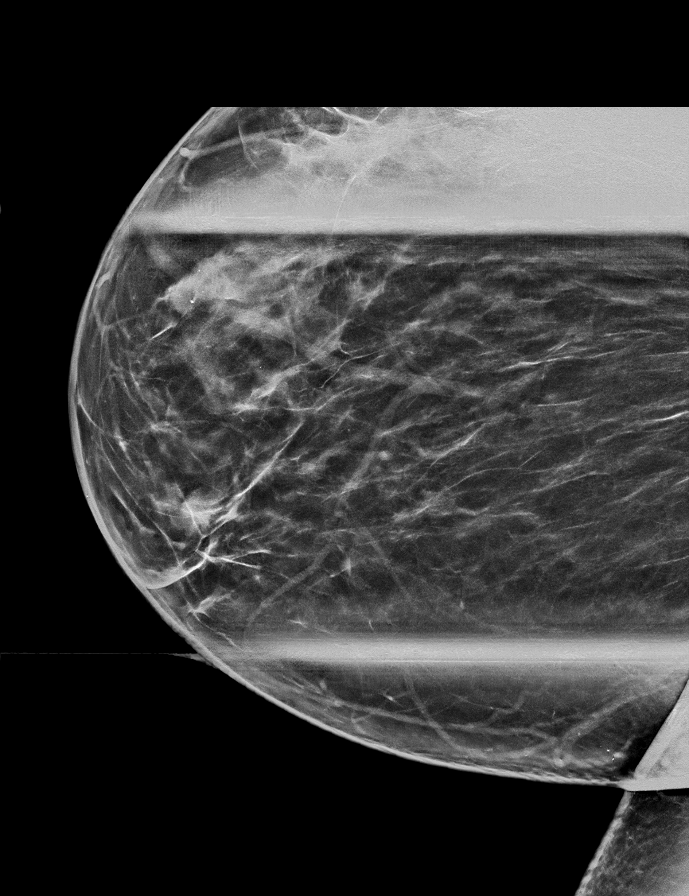

[R CC synth-2D (1 of 3)]
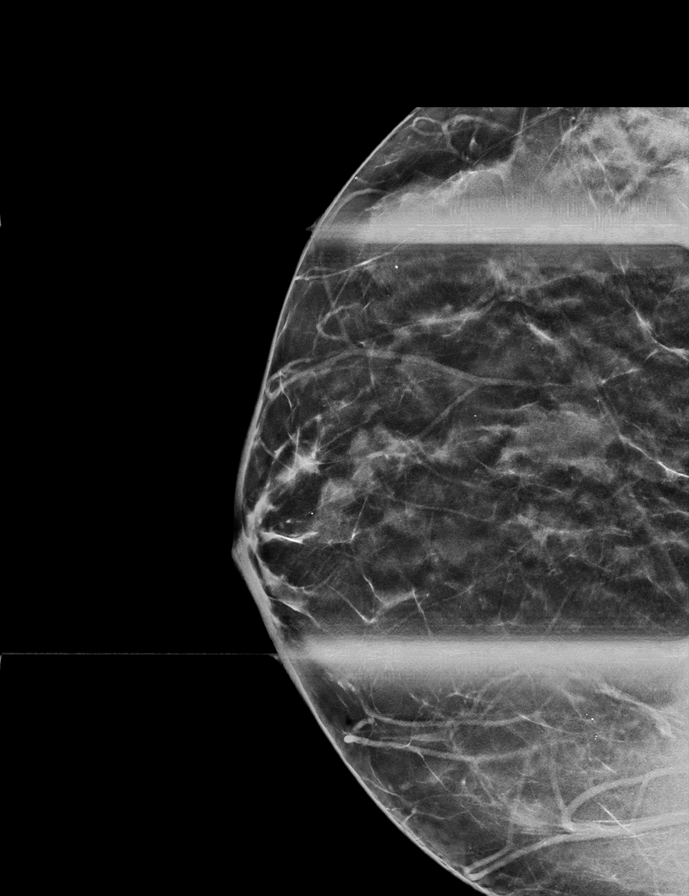

[R CC synth-2D (2 of 3)]
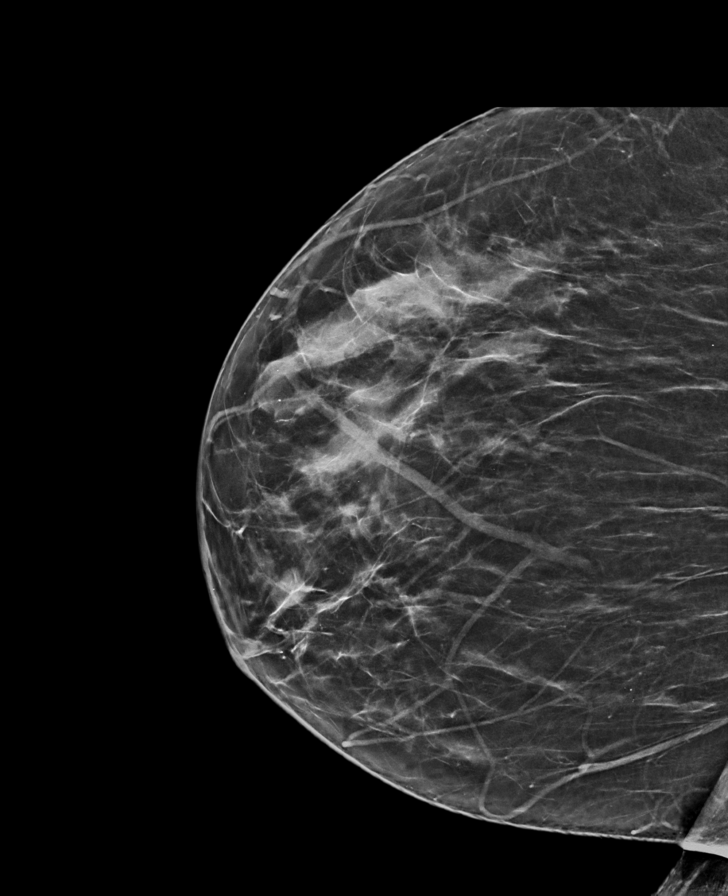

[L MLO synth-2D]
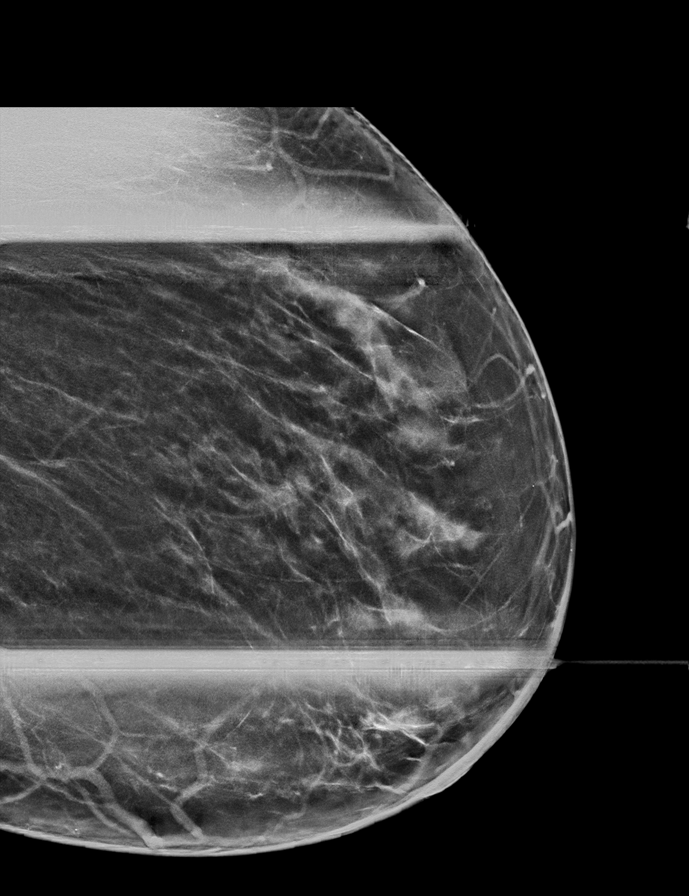

[R CC synth-2D (3 of 3)]
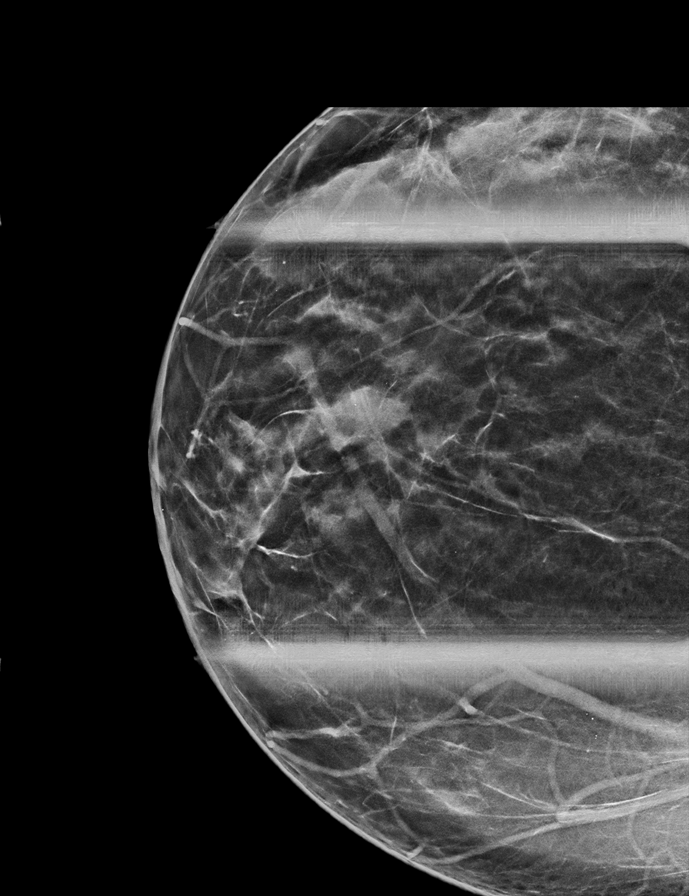

[R MLO synth-2D (2 of 2)]
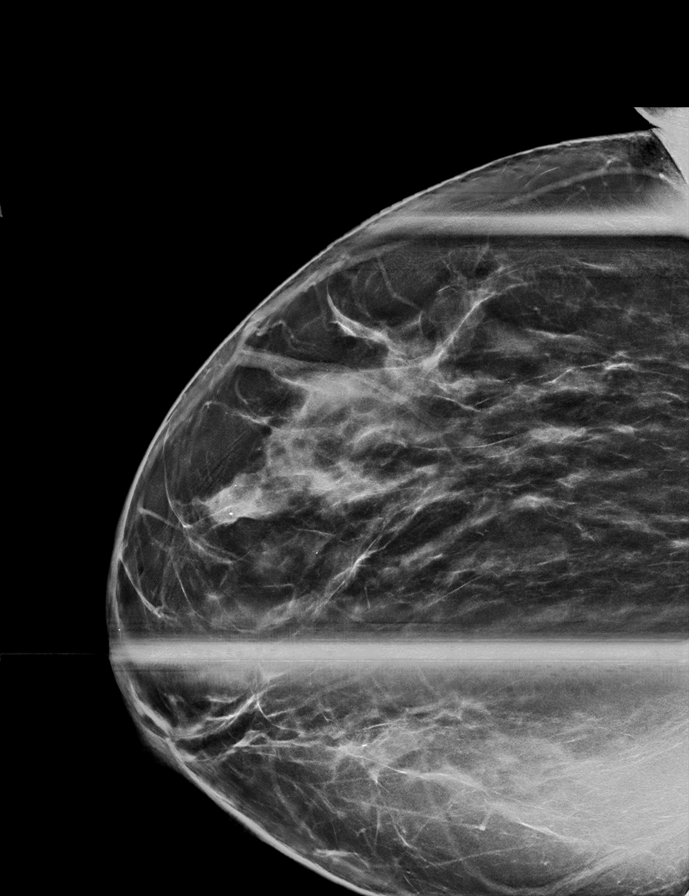

[L CC synth-2D]
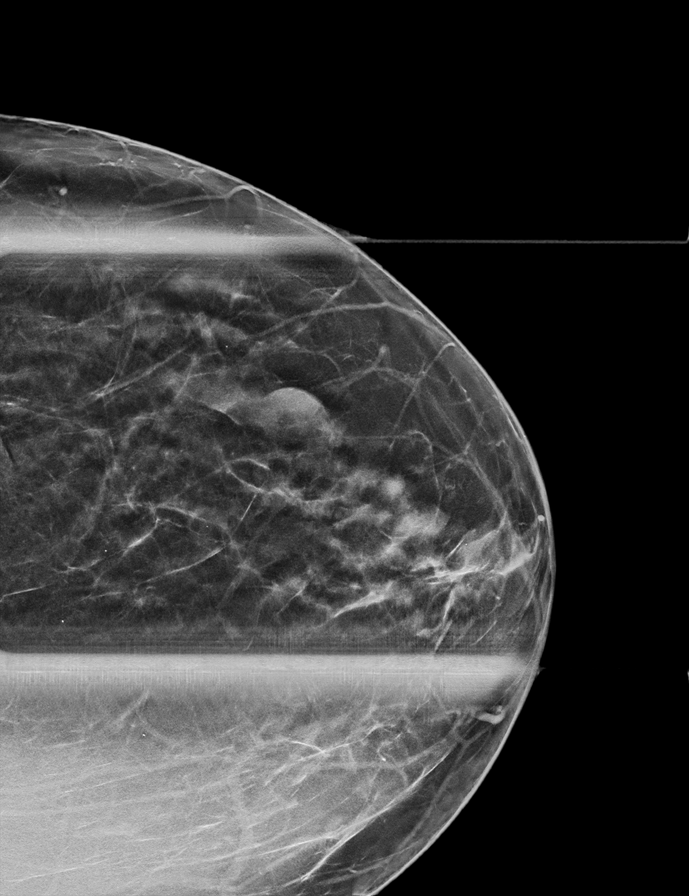

[L CC tomo · tomo slice 27/54.0]
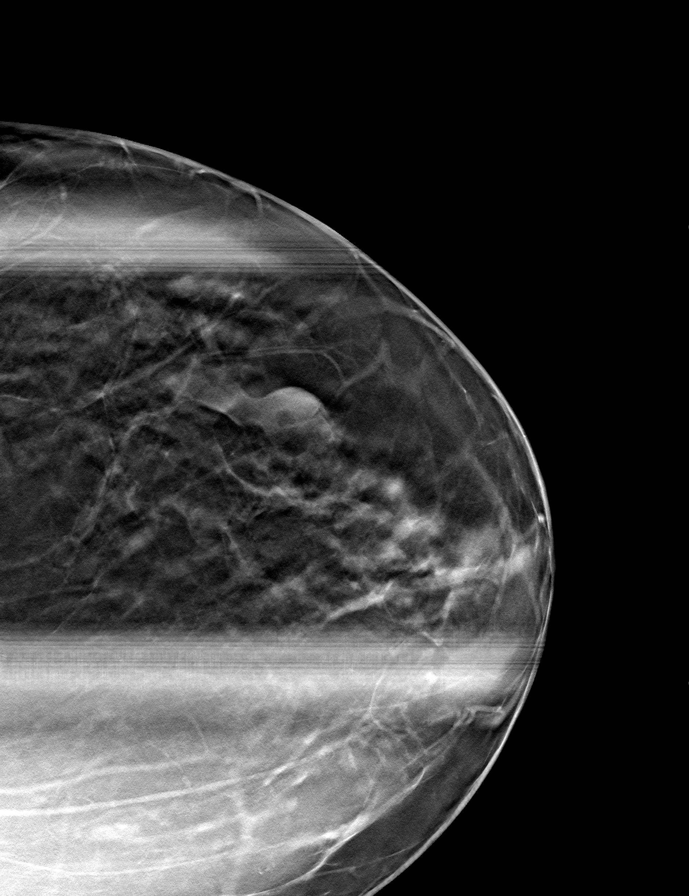

[8 of 40 positions shown; findings below may reference images not displayed]

ACR Breast Density Category b: There are scattered areas of
fibroglandular density.
FINDINGS: Spot compression tomosynthesis images through the anterior right
breast demonstrates an irregular mass in the superior slightly
lateral right breast. There is an oval obscured mass in the
retroareolar right breast.

Spot compression tomosynthesis images of the upper-outer left breast
demonstrates an obscured oval mass in the anterior depth.

Ultrasound of the right breast at 11 o'clock, 1 cm from the nipple
demonstrates an anechoic oval circumscribed mass measuring 1.0 x
x 0.8 cm.

In the right breast at 11 o'clock, 6 cm from the nipple there is a
cluster of cysts measuring all together 2.5 x 0.7 x 1.4 cm.

Ultrasound of the left breast at 1 o'clock, 7 cm from the nipple
demonstrates a cluster of anechoic cysts measuring 2.2 x 0.9 x
cm.
IMPRESSION: 1. There is a likely benign cluster of cysts in the right breast at
11 o'clock measuring 2.5 cm.

2.  Benign bilateral fibrocystic changes.

RECOMMENDATION:
Six-month follow-up diagnostic right breast mammogram and ultrasound
for the likely benign cluster of cysts at 11 o'clock in the right
breast.

I have discussed the findings and recommendations with the patient.
If applicable, a reminder letter will be sent to the patient
regarding the next appointment.

BI-RADS CATEGORY  3: Probably benign.

ADDENDUM:
Correction to the
ACR Breast Density Category b: There are scattered areas of
fibroglandular density.
FINDINGS: Spot compression tomosynthesis images through the anterior right
breast demonstrates an irregular mass in the superior slightly
lateral right breast. There is an oval obscured mass in the
retroareolar right breast.

Spot compression tomosynthesis images of the upper-outer left breast
demonstrates an obscured oval mass in the anterior depth.

Ultrasound of the right breast at 11 o'clock, 1 cm from the nipple
demonstrates an anechoic oval circumscribed mass measuring 1.0 x
x 0.8 cm.

In the right breast at 11 o'clock, 6 cm from the nipple there is a
cluster of cysts measuring all together 2.5 x 0.7 x 1.4 cm.

Ultrasound of the left breast at 1 o'clock, 7 cm from the nipple
demonstrates a cluster of anechoic cysts measuring 2.2 x 0.9 x
cm.
IMPRESSION: 1. There is a likely benign cluster of cysts in the right breast at
11 o'clock measuring 2.5 cm.

2.  Benign bilateral fibrocystic changes.

RECOMMENDATION:
Six-month follow-up diagnostic right breast mammogram and ultrasound
for the likely benign cluster of cysts at 11 o'clock in the right
breast.

I have discussed the findings and recommendations with the patient.
If applicable, a reminder letter will be sent to the patient
regarding the next appointment.

BI-RADS CATEGORY  3: Probably benign.

## 2022-01-23 ENCOUNTER — Other Ambulatory Visit: Payer: Self-pay | Admitting: Critical Care Medicine

## 2022-01-23 DIAGNOSIS — M4644 Discitis, unspecified, thoracic region: Secondary | ICD-10-CM

## 2022-01-23 NOTE — Telephone Encounter (Signed)
Medication Refill - Medication: gabapentin (NEURONTIN) 300 MG capsule, methocarbamol (ROBAXIN) 500 MG tablet ? ?Has the patient contacted their pharmacy? Yes.   ?(Agent: If no, request that the patient contact the pharmacy for the refill. If patient does not wish to contact the pharmacy document the reason why and proceed with request.) ?(Agent: If yes, when and what did the pharmacy advise?) ? ?Preferred Pharmacy (with phone number or street name): [facility] - [phone] ?Has the patient been seen for an appointment in the last year OR does the patient have an upcoming appointment? Yes.   ? ?Agent: Please be advised that RX refills may take up to 3 business days. We ask that you follow-up with your pharmacy.  ? ?

## 2022-01-24 MED ORDER — GABAPENTIN 300 MG PO CAPS: 600.0000 mg | ORAL_CAPSULE | Freq: Two times a day (BID) | ORAL | 4 refills | Status: DC

## 2022-01-24 NOTE — Telephone Encounter (Signed)
Requested Prescriptions  ?Pending Prescriptions Disp Refills  ?? methocarbamol (ROBAXIN) 500 MG tablet 60 tablet 4  ?  Sig: Take 1 tablet (500 mg total) by mouth every 6 (six) hours as needed for muscle spasms.  ?  ? Not Delegated - Analgesics:  Muscle Relaxants Failed - 01/23/2022 12:41 PM  ?  ?  Failed - This refill cannot be delegated  ?  ?  Passed - Valid encounter within last 6 months  ?  Recent Outpatient Visits   ?      ? 1 month ago Elevated brain natriuretic peptide (BNP) level  ? [facility] Storm Frisk, [facility]  ? 4 months ago Osteomyelitis of thoracic vertebra [facility])  ? [facility] Storm Frisk, MD  ? 7 months ago Osteomyelitis of thoracic vertebra [facility])  ? [facility] Storm Frisk, MD  ? 9 months ago Osteomyelitis of thoracic vertebra [facility])  ? [facility] Storm Frisk, [facility]  ? 11 months ago Discitis of thoracic region  ? [facility] [place], [place], [place]  ?  ?  ?Future Appointments   ?        ? In 1 month  [facility]  ? In 2 months Storm Frisk, MD [facility]  ? In 4 months Little Ishikawa, MD [facility]  ?  ? ?  ?  ?  ?? gabapentin (NEURONTIN) 300 MG capsule 60 capsule 4  ?  Sig: Take 2 capsules (600 mg total) by mouth 2 (two) times daily.  ?  ? Neurology: Anticonvulsants - gabapentin Passed - 01/23/2022 12:41 PM  ?  ?  Passed - Cr in normal range and within 360 days  ?  Creat  ?Date Value Ref Range Status  ?05/03/2021 1.07 (H) 0.50 - 1.03 mg/dL Final  ? ?Creatinine, Ser  ?Date Value Ref Range Status  ?12/16/2021 0.81 0.57 - 1.00 mg/dL Final  ?   ?  ?  Passed - Completed PHQ-2 or PHQ-9 in the last 360 days  ?  ?  Passed - Valid encounter within last 12 months  ?  Recent Outpatient Visits   ?      ? 1 month ago Elevated brain natriuretic peptide (BNP) level   ? [facility] Storm Frisk, [facility]  ? 4 months ago Osteomyelitis of thoracic vertebra [facility])  ? [facility] Storm Frisk, MD  ? 7 months ago Osteomyelitis of thoracic vertebra [facility])  ? [facility] Storm Frisk, MD  ? 9 months ago Osteomyelitis of thoracic vertebra [facility])  ? [facility] Storm Frisk, MD  ? 11 months ago Discitis of thoracic region  ? [facility] [place], [place], [place]  ?  ?  ?Future Appointments   ?        ? In 1 month  [facility]  ? In 2 months Storm Frisk, MD [facility]  ? In 4 months Little Ishikawa, MD [facility]  ?  ? ?  ?  ?  ? ?

## 2022-01-24 NOTE — Telephone Encounter (Signed)
Requested medication (s) are due for refill today: yes ? ?Requested medication (s) are on the active medication list: yes ? ?Last refill:  12/13/21 #60 4 refills ? ?Future visit scheduled: yes in 2 months ? ?Notes to clinic:  not delegated per protocol ? ? ?  ?Requested Prescriptions  ?Pending Prescriptions Disp Refills  ? methocarbamol (ROBAXIN) 500 MG tablet 60 tablet 4  ?  Sig: Take 1 tablet (500 mg total) by mouth every 6 (six) hours as needed for muscle spasms.  ?  ? Not Delegated - Analgesics:  Muscle Relaxants Failed - 01/23/2022 12:41 PM  ?  ?  Failed - This refill cannot be delegated  ?  ?  Passed - Valid encounter within last 6 months  ?  Recent Outpatient Visits   ? ?      ? 1 month ago Elevated brain natriuretic peptide (BNP) level  ? [facility] Storm Frisk, [facility]  ? 4 months ago Osteomyelitis of thoracic vertebra [facility])  ? [facility] Storm Frisk, MD  ? 7 months ago Osteomyelitis of thoracic vertebra [facility])  ? [facility]isk, [facility]  ? 9 months ago Osteomyelitis of thoracic vertebra [facility])  ? [facility] Storm Frisk, MD  ? 11 months ago Discitis of thoracic region  ? [facility] [place], [place], [place]  ? ?  ?  ?Future Appointments   ? ?        ? In 1 month  [facility]  ? In 2 months Storm Frisk, MD [facility]  ? In 4 months Little Ishikawa, MD [facility]  ? ?  ? ?  ?  ?  ?Signed Prescriptions Disp Refills  ? gabapentin (NEURONTIN) 300 MG capsule 60 capsule 4  ?  Sig: Take 2 capsules (600 mg total) by mouth 2 (two) times daily.  ?  ? Neurology: Anticonvulsants - gabapentin Passed - 01/23/2022 12:41 PM  ?  ?  Passed - Cr in normal range and within 360 days  ?  Creat  ?Date Value Ref Range Status  ?05/03/2021 1.07 (H) 0.50 - 1.03 mg/dL Final   ? ?Creatinine, Ser  ?Date Value Ref Range Status  ?12/16/2021 0.81 0.57 - 1.00 mg/dL Final  ?  ?  ?  ?  Passed - Completed PHQ-2 or PHQ-9 in the last 360 days  ?  ?  Passed - Valid encounter within last 12 months  ?  Recent Outpatient Visits   ? ?      ? 1 month ago Elevated brain natriuretic peptide (BNP) level  ? [facility] Storm Frisk, MD  ? 4 months ago Osteomyelitis of thoracic vertebra [facility])  ? [facility] Storm Frisk, MD  ? 7 months ago Osteomyelitis of thoracic vertebra [facility])  ? [facility] Storm Frisk, MD  ? 9 months ago Osteomyelitis of thoracic vertebra [facility])  ? [facility] Storm Frisk, MD  ? 11 months ago Discitis of thoracic region  ? [facility] [place], [place], [place]  ? ?  ?  ?Future Appointments   ? ?        ? In 1 month  [facility]  ?  In 2 months Storm Frisk, MD [facility]  ? In 4 months Little Ishikawa, MD [facility]  ? ?  ? ?  ?  ?  ? ?

## 2022-01-25 MED ORDER — METHOCARBAMOL 500 MG PO TABS
500.0000 mg | ORAL_TABLET | Freq: Four times a day (QID) | ORAL | 4 refills | Status: DC | PRN
Start: 2022-01-25 — End: 2022-04-12

## 2022-02-15 ENCOUNTER — Encounter: Payer: Self-pay | Admitting: Pharmacist

## 2022-02-16 ENCOUNTER — Encounter: Payer: Self-pay | Admitting: Cardiology

## 2022-02-16 ENCOUNTER — Encounter: Payer: Self-pay | Admitting: Critical Care Medicine

## 2022-03-17 ENCOUNTER — Ambulatory Visit: Payer: Managed Care, Other (non HMO)

## 2022-04-01 ENCOUNTER — Other Ambulatory Visit: Payer: Self-pay | Admitting: Critical Care Medicine

## 2022-04-01 DIAGNOSIS — M4644 Discitis, unspecified, thoracic region: Secondary | ICD-10-CM

## 2022-04-12 ENCOUNTER — Encounter: Payer: Self-pay | Admitting: Critical Care Medicine

## 2022-04-12 ENCOUNTER — Ambulatory Visit: Payer: Commercial Managed Care - HMO | Attending: Critical Care Medicine | Admitting: Critical Care Medicine

## 2022-04-12 VITALS — BP 129/86 | HR 85 | Wt 289.8 lb

## 2022-04-12 DIAGNOSIS — Z6841 Body Mass Index (BMI) 40.0 and over, adult: Secondary | ICD-10-CM

## 2022-04-12 DIAGNOSIS — R7989 Other specified abnormal findings of blood chemistry: Secondary | ICD-10-CM | POA: Diagnosis not present

## 2022-04-12 DIAGNOSIS — R635 Abnormal weight gain: Secondary | ICD-10-CM | POA: Diagnosis not present

## 2022-04-12 DIAGNOSIS — M4624 Osteomyelitis of vertebra, thoracic region: Secondary | ICD-10-CM

## 2022-04-12 DIAGNOSIS — M4644 Discitis, unspecified, thoracic region: Secondary | ICD-10-CM | POA: Diagnosis not present

## 2022-04-12 DIAGNOSIS — I10 Essential (primary) hypertension: Secondary | ICD-10-CM

## 2022-04-12 DIAGNOSIS — F4321 Adjustment disorder with depressed mood: Secondary | ICD-10-CM

## 2022-04-12 MED ORDER — SERTRALINE HCL 50 MG PO TABS: 50.0000 mg | ORAL_TABLET | Freq: Every day | ORAL | 3 refills | Status: DC

## 2022-04-12 MED ORDER — FUROSEMIDE 20 MG PO TABS: 20.0000 mg | ORAL_TABLET | Freq: Every day | ORAL | 2 refills | Status: DC

## 2022-04-12 MED ORDER — GABAPENTIN 300 MG PO CAPS: 600.0000 mg | ORAL_CAPSULE | Freq: Two times a day (BID) | ORAL | 4 refills | Status: DC

## 2022-04-12 MED ORDER — METHOCARBAMOL 500 MG PO TABS: 500.0000 mg | ORAL_TABLET | Freq: Four times a day (QID) | ORAL | 4 refills | Status: DC | PRN

## 2022-04-12 NOTE — Assessment & Plan Note (Signed)
Due to loss of a pet that occurred on anniversary of mother's death  Patient given grief counseling resources from our local hospice agency  We will begin sertraline 50 mg daily

## 2022-04-12 NOTE — Assessment & Plan Note (Signed)
Hypertension well controlled at this visit continue current medications refills given

## 2022-04-12 NOTE — Patient Instructions (Signed)
Call [facility] for grief counseling   [phone]  Start sertraline one pill daily for depression  Refills on all medications sent to [facility]  no other changes  Keep Oct appt with mammogram unit  Keep Aug appt with neurosurgery  Handicap form issued  Return Dr Delford Field 4 months

## 2022-04-12 NOTE — Assessment & Plan Note (Signed)
Patient off antibiotics refill Robaxin and gabapentin and encouraged to keep follow-up with neurosurgery which will occur in August

## 2022-04-12 NOTE — Progress Notes (Signed)
Established Patient Office Visit  Subjective:  Patient ID: Stacey Kaiser, female    DOB: May 21, 1971  Age: 51 y.o. MRN: [id number]  CC:  Chief Complaint  Patient presents with   Depression    HPI Stacey Kaiser presents for primary care follow-up.  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  Patient is yet to achieve an appointment with neurosurgery she has to pay off what bills she goes from her previous hospital consultations before they will see her.  Patient is going to receive a sleep study per cardiology.  She was started losartan at the last cardiology visit and potassium was stopped.  She has a IT consultant.  She has a referral to cardiology pharmacy clinic for consideration of Ozempic to help with weight loss.  On arrival blood pressure 128/82.  Patient states her level of dyspnea is improved since she has been on the losartan.  She still is not sleeping well at this time.  Patient only eats 1 meal a day and does not follow a healthy diet.  She did bring in her colon cancer screening kit at this visit.  She has a mammogram scheduled March 2.  She cannot tolerate to lay flat to get a Pap smear we will have to put this off for now.  Body weight continues to be at a mass index of 63 point  6/28 Patient seen in return follow-up and she suffered an anniversary grief reaction her mother died 5 years ago and she had a cat passed away on her mother's date of death this is triggered a grief reaction its been quite severe.  Patient's had failing health as well and not able to work and this is also contributed to her depression.  She was quite tearful today on the interview.  She was seen by cardiology recently in March with elevated blood pressure readings we had been treating her with 50 mg  losartan dose was increased to 100 mg losartan and she is noted improvement with home readings and today on arrival blood pressure 129/86  Patient begin the lifestyle medicine handout at the last visit she is trying to follow a healthier diet has trouble doing any kind of exercise because of severe back pain.  Patient uses gabapentin and nonsteroidals and Robaxin for back pain.  She is off all antibiotics per infectious disease for discitis and has follow-up appoint with neurosurgery in August.  Patient's colon cancer screening was negative but her mammogram showed breast cysts and fibrocystic disease ultrasound and tomographic images of the breast did not show malignancy she will have repeat imaging in 6 months  Patient would like a handicap sticker at this visit  Cardiology wrote for we go V but her insurance would not cover this form of Ozempic.  There is no other complaints  Past Medical History:  Diagnosis Date   Diskitis 01/17/2021   Elevated serum creatinine 03/24/2021   Family history of adverse reaction to anesthesia    " my mother had a seizure coming out of cabg surgery "   Morbid obesity ([place])    Morbid obesity with BMI of 50.0-59.9, adult ([place]) 01/17/2021   Scoliosis     Past Surgical History:  Procedure Laterality Date   IR FLUORO GUIDED NEEDLE PLC ASPIRATION/INJECTION LOC  01/18/2021   RADIOLOGY WITH ANESTHESIA N/A 01/18/2021  Procedure: IR WITH ANESTHESIA;  Surgeon: Radiologist, Medication, MD;  Location: [facility];  Service: Radiology;  Laterality: N/A;   WISDOM TOOTH EXTRACTION      Family History  Problem Relation Age of Onset   Heart failure Mother    Chronic Renal Failure Mother    Diabetes Mother    Breast cancer Cousin     Social History   Socioeconomic History   Marital status: Soil scientist    Spouse name: Not on file   Number of children: 0   Years of education: Not on file   Highest education level: Not on file  Occupational History   Occupation:  retail at [employer]  Tobacco Use   Smoking status: Never   Smokeless tobacco: Never  Vaping Use   Vaping Use: Never used  Substance and Sexual Activity   Alcohol use: Not Currently    Comment: rare   Drug use: No   Sexual activity: Not on file  Other Topics Concern   Not on file  Social History Narrative   Not on file   Social Determinants of Health   Financial Resource Strain: Medium Risk (05/25/2021)   Overall Financial Resource Strain (CARDIA)    Difficulty of Paying Living Expenses: Somewhat hard  Food Insecurity: No Food Insecurity (05/25/2021)   Hunger Vital Sign    Worried About Running Out of Food in the Last Year: Never true    Ran Out of Food in the Last Year: Never true  Transportation Needs: No Transportation Needs (05/25/2021)   PRAPARE - Hydrologist (Medical): No    Lack of Transportation (Non-Medical): No  Physical Activity: Not on file  Stress: Stress Concern Present (05/25/2021)   [place]    Feeling of Stress : To some extent  Social Connections: Not on file  Intimate Partner Violence: Not on file    Outpatient Medications Prior to Visit  Medication Sig Dispense Refill   ibuprofen (ADVIL) 200 MG tablet Take 200 mg by mouth every 6 (six) hours as needed.     losartan (COZAAR) 100 MG tablet Take 1 tablet (100 mg total) by mouth daily. 90 tablet 3   OVER THE COUNTER MEDICATION Take 1 tablet by mouth daily. Power c gummy     furosemide (LASIX) 20 MG tablet Take 1 tablet (20 mg total) by mouth daily. 30 tablet 2   gabapentin (NEURONTIN) 300 MG capsule Take 2 capsules by mouth twice daily 60 capsule 0   methocarbamol (ROBAXIN) 500 MG tablet Take 1 tablet (500 mg total) by mouth every 6 (six) hours as needed for muscle spasms. 60 tablet 4   albuterol (VENTOLIN HFA) 108 (90 Base) MCG/ACT inhaler Inhale 2 puffs into the lungs every 4 (four) hours as needed for  wheezing or shortness of breath.     Coenzyme Q10 (CO Q 10 PO) Take 1 tablet by mouth daily. gummy (Patient not taking: Reported on 04/12/2022)     Cyanocobalamin (VITAMIN B-12 PO) Take 1 tablet by mouth daily. gummy (Patient not taking: Reported on 04/12/2022)     Multiple Vitamins-Minerals (ONE-A-DAY WOMENS PETITES PO) Take 1 tablet by mouth daily. (Patient not taking: Reported on 04/12/2022)     cefadroxil (DURICEF) 500 MG capsule Take 1 capsule (500 mg total) by mouth 2 (two) times daily. (Patient not taking: Reported on 04/12/2022) 60 capsule 0   doxycycline (VIBRA-TABS) 100 MG tablet Take 1 tablet (100 mg  total) by mouth 2 (two) times daily. (Patient not taking: Reported on 04/12/2022) 60 tablet 0   Multiple Vitamins-Minerals (HAIR SKIN AND NAILS FORMULA PO) Take 1 tablet by mouth daily. (Patient not taking: Reported on 04/12/2022)     Omega-3 Fatty Acids (OMEGA-3 FISH OIL PO) Take 1 capsule by mouth daily. (Patient not taking: Reported on 04/12/2022)     Semaglutide,0.25 or 0.5MG/DOS, (OZEMPIC, 0.25 OR 0.5 MG/DOSE,) 2 MG/1.5ML SOPN Inject 0.5 mg into the skin once a week. (Patient not taking: Reported on 04/12/2022) 1.5 mL 0   Semaglutide-Weight Management (WEGOVY) 0.25 MG/0.5ML SOAJ Inject 0.25 mg into the skin once a week. (Patient not taking: Reported on 04/12/2022) 2 mL 0   Semaglutide-Weight Management (WEGOVY) 0.5 MG/0.5ML SOAJ Inject 0.5 mg into the skin once a week. (Patient not taking: Reported on 04/12/2022) 2 mL 0   No facility-administered medications prior to visit.    Allergies  Allergen Reactions   Okra Hives   Other     Bees and wasps    ROS Review of Systems  Constitutional:  Positive for unexpected weight change. Negative for fatigue.  HENT: Negative.  Negative for ear pain, postnasal drip, rhinorrhea, sinus pressure, sore throat, trouble swallowing and voice change.   Eyes: Negative.   Respiratory:  Negative for apnea, cough, choking, chest tightness, shortness of breath,  wheezing and stridor.   Cardiovascular: Negative.  Negative for chest pain, palpitations and leg swelling.  Gastrointestinal: Negative.  Negative for abdominal distention, abdominal pain, nausea and vomiting.  Genitourinary: Negative.   Musculoskeletal:  Positive for back pain. Negative for arthralgias and myalgias.  Skin: Negative.  Negative for rash.  Allergic/Immunologic: Negative.  Negative for environmental allergies and food allergies.  Neurological: Negative.  Negative for dizziness, syncope, weakness and headaches.  Hematological: Negative.  Negative for adenopathy. Does not bruise/bleed easily.  Psychiatric/Behavioral:  Positive for sleep disturbance. Negative for agitation and suicidal ideas. The patient is not nervous/anxious.       Objective:    Physical Exam Vitals reviewed.  Constitutional:      Appearance: Normal appearance. She is well-developed. She is obese. She is not diaphoretic.     Comments: Morbidly obese  HENT:     Head: Normocephalic and atraumatic.     Nose: Nose normal. No nasal deformity, septal deviation, mucosal edema or rhinorrhea.     Right Sinus: No maxillary sinus tenderness or frontal sinus tenderness.     Left Sinus: No maxillary sinus tenderness or frontal sinus tenderness.     Mouth/Throat:     Mouth: Mucous membranes are moist.     Pharynx: Oropharynx is clear. No oropharyngeal exudate.  Eyes:     General: No scleral icterus.    Conjunctiva/sclera: Conjunctivae normal.     Pupils: Pupils are equal, round, and reactive to light.  Neck:     Thyroid: No thyromegaly.     Vascular: No carotid bruit or JVD.     Trachea: Trachea normal. No tracheal tenderness or tracheal deviation.  Cardiovascular:     Rate and Rhythm: Normal rate and regular rhythm.     Chest Wall: PMI is not displaced.     Pulses: Normal pulses. No decreased pulses.     Heart sounds: Normal heart sounds, S1 normal and S2 normal. Heart sounds not distant. No murmur heard.     No systolic murmur is present.     No diastolic murmur is present.     No friction rub. No gallop. No S3  or S4 sounds.  Pulmonary:     Effort: Pulmonary effort is normal. No tachypnea, accessory muscle usage or respiratory distress.     Breath sounds: Normal breath sounds. No stridor. No decreased breath sounds, wheezing, rhonchi or rales.  Chest:     Chest wall: No tenderness.  Abdominal:     General: Bowel sounds are normal. There is no distension.     Palpations: Abdomen is soft. Abdomen is not rigid.     Tenderness: There is no abdominal tenderness. There is no guarding or rebound.  Musculoskeletal:        General: Normal range of motion.     Cervical back: Normal range of motion and neck supple. No edema, erythema or rigidity. No muscular tenderness. Normal range of motion.  Lymphadenopathy:     Head:     Right side of head: No submental or submandibular adenopathy.     Left side of head: No submental or submandibular adenopathy.     Cervical: No cervical adenopathy.  Skin:    General: Skin is warm and dry.     Coloration: Skin is not pale.     Findings: No rash.     Nails: There is no clubbing.  Neurological:     General: No focal deficit present.     Mental Status: She is alert and oriented to person, place, and time.     Sensory: No sensory deficit.  Psychiatric:        Mood and Affect: Mood normal.        Speech: Speech normal.        Behavior: Behavior normal.        Thought Content: Thought content normal.     BP 129/86   Pulse 85   Wt 289 lb 12.8 oz (131.5 kg)   SpO2 95%   BMI 62.71 kg/m  Wt Readings from Last 3 Encounters:  04/12/22 289 lb 12.8 oz (131.5 kg)  12/16/21 297 lb 3.2 oz (134.8 kg)  12/13/21 295 lb 12.8 oz (134.2 kg)     Health Maintenance Due  Topic Date Due   Zoster Vaccines- Shingrix (1 of 2) Never done   COVID-19 Vaccine (4 - Pfizer series) 09/18/2020    There are no preventive care reminders to display for this patient.  Lab  Results  Component Value Date   TSH 3.660 02/24/2021   Lab Results  Component Value Date   WBC 8.0 03/24/2021   HGB 14.1 03/24/2021   HCT 42.0 03/24/2021   MCV 87.7 03/24/2021   PLT 364 03/24/2021   Lab Results  Component Value Date   NA 140 12/16/2021   K 4.3 12/16/2021   CO2 19 (L) 12/16/2021   GLUCOSE 99 12/16/2021   BUN 7 12/16/2021   CREATININE 0.81 12/16/2021   BILITOT 0.4 02/28/2021   ALKPHOS 68 02/28/2021   AST 21 02/28/2021   ALT 13 02/28/2021   PROT 6.6 02/28/2021   ALBUMIN 3.8 02/28/2021   CALCIUM 9.0 12/16/2021   ANIONGAP 10 02/28/2021   EGFR 88 12/16/2021   No results found for: "CHOL" No results found for: "HDL" No results found for: "LDLCALC" No results found for: "TRIG" No results found for: "CHOLHDL" Lab Results  Component Value Date   HGBA1C 5.9 (H) 12/16/2021      Assessment & Plan:   Problem List Items Addressed This Visit       Cardiovascular and Mediastinum   Hypertension    Hypertension well controlled at this visit  continue current medications refills given      Relevant Medications   furosemide (LASIX) 20 MG tablet     Musculoskeletal and Integument   Osteomyelitis of thoracic vertebra (HCC)    Patient off antibiotics refill Robaxin and gabapentin and encouraged to keep follow-up with neurosurgery which will occur in August      Relevant Medications   gabapentin (NEURONTIN) 300 MG capsule   methocarbamol (ROBAXIN) 500 MG tablet   RESOLVED: Discitis of thoracic region   Relevant Medications   gabapentin (NEURONTIN) 300 MG capsule   methocarbamol (ROBAXIN) 500 MG tablet     Other   BMI 60.0-69.9, adult (HCC)    We discussed lack of ability to get Ozempic due to insurance limitations at this time  The following Lifestyle Medicine recommendations according to [employer] of Lifestyle Medicine [facility]) were discussed and offered to patient who agrees to start the journey:  A. Whole Foods, Plant-based plate comprising of  fruits and vegetables, plant-based proteins, whole-grain carbohydrates was discussed in detail with the patient.   A list for source of those nutrients were also provided to the patient.  Patient will use only water or unsweetened tea for hydration. B.  The need to stay away from risky substances including alcohol, smoking; obtaining 7 to 9 hours of restorative sleep, at least 150 minutes of moderate intensity exercise weekly, the importance of healthy social connections,  and stress reduction techniques were discussed.        Situational depression    Due to loss of a pet that occurred on anniversary of mother's death  Patient given grief counseling resources from our local hospice agency  We will begin sertraline 50 mg daily        Relevant Medications   sertraline (ZOLOFT) 50 MG tablet   Other Visit Diagnoses     Elevated brain natriuretic peptide (BNP) level       Relevant Medications   furosemide (LASIX) 20 MG tablet   Weight gain       Relevant Medications   furosemide (LASIX) 20 MG tablet      Meds ordered this encounter  Medications   sertraline (ZOLOFT) 50 MG tablet    Sig: Take 1 tablet (50 mg total) by mouth daily.    Dispense:  30 tablet    Refill:  3   furosemide (LASIX) 20 MG tablet    Sig: Take 1 tablet (20 mg total) by mouth daily.    Dispense:  30 tablet    Refill:  2   gabapentin (NEURONTIN) 300 MG capsule    Sig: Take 2 capsules (600 mg total) by mouth 2 (two) times daily.    Dispense:  60 capsule    Refill:  4   methocarbamol (ROBAXIN) 500 MG tablet    Sig: Take 1 tablet (500 mg total) by mouth every 6 (six) hours as needed for muscle spasms.    Dispense:  60 tablet    Refill:  4  Follow-up: Return in about 4 months (around 08/12/2022).    Asencion Noble, MD

## 2022-04-12 NOTE — Assessment & Plan Note (Signed)
We discussed lack of ability to get Ozempic due to insurance limitations at this time  The following Lifestyle Medicine recommendations according to [employer] of Lifestyle Medicine [facility]) were discussed and offered to patient who agrees to start the journey:  A. Whole Foods, Plant-based plate comprising of fruits and vegetables, plant-based proteins, whole-grain carbohydrates was discussed in detail with the patient.   A list for source of those nutrients were also provided to the patient.  Patient will use only water or unsweetened tea for hydration. B.  The need to stay away from risky substances including alcohol, smoking; obtaining 7 to 9 hours of restorative sleep, at least 150 minutes of moderate intensity exercise weekly, the importance of healthy social connections,  and stress reduction techniques were discussed.

## 2022-06-01 ENCOUNTER — Telehealth: Payer: Self-pay | Admitting: Critical Care Medicine

## 2022-06-01 NOTE — Telephone Encounter (Signed)
Patient states [employer] stated disability forms were faxed over 3 different times since May,  and has received response back from PCP. [employer] will refax to [phone] please follow up with patient directly

## 2022-06-02 NOTE — Telephone Encounter (Signed)
Noted,once received paperwork will be provided to PCP

## 2022-06-05 NOTE — Progress Notes (Unsigned)
Cardiology Office Note:    Date:  06/06/2022   ID:  Stacey Kaiser, DOB 1971-07-05, MRN [id number]  PCP:  Storm Frisk, MD  Cardiologist:  Little Ishikawa, MD  Electrophysiologist:  None   Referring MD: Storm Frisk, MD   Chief Complaint  Patient presents with   Follow-up    History of Present Illness:    Stacey Kaiser is a 51 y.o. female with a hx of morbid obesity who is referred by Georgian Co, PA for evaluation of dyspnea.  Echocardiogram 01/19/2021 showed EF 55 to 60%, normal diastolic function, normal RV function, RVSP 50 mmHg, mild MR. She was admitted in April 2022 with discitis/osteomyelitis and compression fractures in spine.  She continues on oral antibiotics, follows with ID.  She reports she has been having dyspnea with exertion.  States gets short of breath with minimal exertion.  She denies any chest pain.  Denies any lightheadedness, syncope, palpitations, or lower extremity edema.  No smoking history.  Family history includes mother had CABG in 94s and CHF.  Since last clinic visit, she reports weight is down 8 pounds.  Insurance would not cover [employer].  Reports dyspnea has improved.  She denies any chest pain, palpitations.  Does report some lightheadedness, denies any syncope.  Reports occasional lower extremity edema.  She is walking 10 minutes daily.   Wt Readings from Last 3 Encounters:  06/06/22 286 lb 9.6 oz (130 kg)  04/12/22 289 lb 12.8 oz (131.5 kg)  12/16/21 297 lb 3.2 oz (134.8 kg)   BP Readings from Last 3 Encounters:  06/06/22 133/80  04/12/22 129/86  12/16/21 (!) 151/99     Past Medical History:  Diagnosis Date   Diskitis 01/17/2021   Elevated serum creatinine 03/24/2021   Family history of adverse reaction to anesthesia    " my mother had a seizure coming out of cabg surgery "   Morbid obesity (HCC)    Morbid obesity with BMI of 50.0-59.9, adult (HCC) 01/17/2021   Scoliosis     Past Surgical History:   Procedure Laterality Date   IR FLUORO GUIDED NEEDLE PLC ASPIRATION/INJECTION LOC  01/18/2021   RADIOLOGY WITH ANESTHESIA N/A 01/18/2021   Procedure: IR WITH ANESTHESIA;  Surgeon: Radiologist, Medication, MD;  Location: [facility] OR;  Service: Radiology;  Laterality: N/A;   WISDOM TOOTH EXTRACTION      Current Medications: Current Meds  Medication Sig   furosemide (LASIX) 20 MG tablet Take 1 tablet (20 mg total) by mouth daily.   gabapentin (NEURONTIN) 300 MG capsule Take 2 capsules (600 mg total) by mouth 2 (two) times daily.   Glucosamine-Chondroit-Vit C-Mn (GLUCOSAMINE 1500 COMPLEX) CAPS    ibuprofen (ADVIL) 200 MG tablet Take 200 mg by mouth every 6 (six) hours as needed.   losartan (COZAAR) 100 MG tablet Take 1 tablet (100 mg total) by mouth daily.   methocarbamol (ROBAXIN) 500 MG tablet Take 1 tablet (500 mg total) by mouth every 6 (six) hours as needed for muscle spasms.   Multiple Vitamins-Minerals (ONE-A-DAY WOMENS PETITES PO) Take 1 tablet by mouth daily.   sertraline (ZOLOFT) 50 MG tablet Take 1 tablet (50 mg total) by mouth daily.     Allergies:   Okra and Other   Social History   Socioeconomic History   Marital status: Media planner    Spouse name: Not on file   Number of children: 0   Years of education: Not on file   Highest education level: Not  on file  Occupational History   Occupation: retail at [employer]  Tobacco Use   Smoking status: Never   Smokeless tobacco: Never  Vaping Use   Vaping Use: Never used  Substance and Sexual Activity   Alcohol use: Not Currently    Comment: rare   Drug use: No   Sexual activity: Not on file  Other Topics Concern   Not on file  Social History Narrative   Not on file   Social Determinants of Health   Financial Resource Strain: Medium Risk (05/25/2021)   Overall Financial Resource Strain (CARDIA)    Difficulty of Paying Living Expenses: Somewhat hard  Food Insecurity: No Food Insecurity (05/25/2021)   Hunger  Vital Sign    Worried About Running Out of Food in the Last Year: Never true    Ran Out of Food in the Last Year: Never true  Transportation Needs: No Transportation Needs (05/25/2021)   PRAPARE - Administrator, Civil Service (Medical): No    Lack of Transportation (Non-Medical): No  Physical Activity: Not on file  Stress: Stress Concern Present (05/25/2021)   [employer] of Occupational Health - Occupational Stress Questionnaire    Feeling of Stress : To some extent  Social Connections: Not on file     Family History: The patient's family history includes Breast cancer in her cousin; Chronic Renal Failure in her mother; Diabetes in her mother; Heart failure in her mother.  ROS:   Please see the history of present illness.     All other systems reviewed and are negative.  EKGs/Labs/Other Studies Reviewed:    The following studies were reviewed today:   EKG:   06/06/2022: Normal sinus rhythm, rate 71, left axis deviation, poor R wave progression 12/07/21: NSR, LAD, poor R wave progression, rate 91  Recent Labs: 12/07/2021: Magnesium 1.9 12/16/2021: BUN 7; Creatinine, Ser 0.81; Potassium 4.3; Sodium 140  Recent Lipid Panel No results found for: "CHOL", "TRIG", "HDL", "CHOLHDL", "VLDL", "LDLCALC", "LDLDIRECT"  Physical Exam:    VS:  BP 133/80 (BP Location: Right Arm, Patient Position: Sitting, Cuff Size: Large) Comment: automatic machine  Pulse 71   Ht 4\' 10"  (1.473 m)   Wt 286 lb 9.6 oz (130 kg)   BMI 59.90 kg/m     Wt Readings from Last 3 Encounters:  06/06/22 286 lb 9.6 oz (130 kg)  04/12/22 289 lb 12.8 oz (131.5 kg)  12/16/21 297 lb 3.2 oz (134.8 kg)     GEN:  Well nourished, well developed in no acute distress HEENT: Normal NECK: No JVD; No carotid bruits LYMPHATICS: No lymphadenopathy CARDIAC: RRR, no murmurs, rubs, gallops RESPIRATORY:  Clear to auscultation without rales, wheezing or rhonchi  ABDOMEN: Soft, non-tender,  non-distended MUSCULOSKELETAL:  No edema; No deformity  SKIN: Warm and dry NEUROLOGIC:  Alert and oriented x 3 PSYCHIATRIC:  Normal affect   ASSESSMENT:    1. Shortness of breath   2. Primary hypertension   3. Snoring   4. Class 3 severe obesity with body mass index (BMI) of 50.0 to 59.9 in adult, unspecified obesity type, unspecified whether serious comorbidity present (HCC)   5. Morbid obesity (HCC)      PLAN:    Dyspnea: Suspect due to obesity/deconditioning.  Echocardiogram in April 2022 showed normal systolic/diastolic function.  Elevated pulmonary pressures on echo, suspect untreated OSA/OHS.  Discussed obtaining sleep study but patient is self-pay and would prefer to hold off at this time.  Asked our social  worker to reach out to the patient about available resources.  Referred to healthy weight and wellness.  If if dyspnea does not improve with weight loss, would recommend further work-up including ischemia evaluation. -She reports improvement in her dyspnea.  Lasix 20 mg daily.  Will check BMET/magnesium  Morbid obesity: Body mass index is 59.9 kg/m.  Referred to healthy weight and wellness.  Started on Wegovy but was not covered by insurance, was discontinued  Hypertension: On losartan 100 mg daily.  Appears controlled.  Snoring/daytime somnolence: Suspect OSA.  STOPBANG 5. Recommend sleep study, will plan for Watchpat home sleep study   RTC in 6 months  Medication Adjustments/Labs and Tests Ordered: Current medicines are reviewed at length with the patient today.  Concerns regarding medicines are outlined above.  Orders Placed This Encounter  Procedures   Basic metabolic panel   Magnesium   Amb Ref to Medical Weight Management   EKG 12-Lead   Itamar Sleep Study   No orders of the defined types were placed in this encounter.   Patient Instructions  Medication Instructions:  Continue same medications *If you need a refill on your cardiac medications before your  next appointment, please call your pharmacy*   Lab Work: Bmet,magnesium today   Testing/Procedures: Itmar Sleep Study    Follow-Up: At [facility], you and your health needs are our priority.  As part of our continuing mission to provide you with exceptional heart care, we have created designated Provider Care Teams.  These Care Teams include your primary Cardiologist (physician) and Advanced Practice Providers (APPs -  Physician Assistants and Nurse Practitioners) who all work together to provide you with the care you need, when you need it.  We recommend signing up for the patient portal called "MyChart".  Sign up information is provided on this After Visit Summary.  MyChart is used to connect with patients for Virtual Visits (Telemedicine).  Patients are able to view lab/test results, encounter notes, upcoming appointments, etc.  Non-urgent messages can be sent to your provider as well.   To learn more about what you can do with MyChart, go to [email or web address].    Your next appointment: 1 year     Call in May to schedule August appointment     The format for your next appointment: Office   Provider:  Dr.Gerrie Castiglia  [facility] will call with appointment    [phone]   Important Information About Sugar         Signed, Little Ishikawa, MD  06/06/2022 10:57 AM    [facility]

## 2022-06-06 ENCOUNTER — Ambulatory Visit: Payer: Commercial Managed Care - HMO | Admitting: Cardiology

## 2022-06-06 ENCOUNTER — Encounter: Payer: Self-pay | Admitting: Cardiology

## 2022-06-06 VITALS — BP 133/80 | HR 71 | Ht <= 58 in | Wt 286.6 lb

## 2022-06-06 DIAGNOSIS — R0683 Snoring: Secondary | ICD-10-CM

## 2022-06-06 DIAGNOSIS — I10 Essential (primary) hypertension: Secondary | ICD-10-CM

## 2022-06-06 DIAGNOSIS — Z6841 Body Mass Index (BMI) 40.0 and over, adult: Secondary | ICD-10-CM

## 2022-06-06 DIAGNOSIS — R0602 Shortness of breath: Secondary | ICD-10-CM

## 2022-06-06 LAB — BASIC METABOLIC PANEL
BUN/Creatinine Ratio: 9 (ref 9–23)
BUN: 9 mg/dL (ref 6–24)
CO2: 25 mmol/L (ref 20–29)
Calcium: 9.7 mg/dL (ref 8.7–10.2)
Chloride: 101 mmol/L (ref 96–106)
Creatinine, Ser: 0.95 mg/dL (ref 0.57–1.00)
Glucose: 94 mg/dL (ref 70–99)
Potassium: 4.6 mmol/L (ref 3.5–5.2)
Sodium: 140 mmol/L (ref 134–144)
eGFR: 73 mL/min/{1.73_m2} (ref >59)

## 2022-06-06 LAB — MAGNESIUM: Magnesium: 2.1 mg/dL (ref 1.6–2.3)

## 2022-06-06 NOTE — Patient Instructions (Signed)
Medication Instructions:  Continue same medications *If you need a refill on your cardiac medications before your next appointment, please call your pharmacy*   Lab Work: Bmet,magnesium today   Testing/Procedures: Itmar Sleep Study    Follow-Up: At [facility], you and your health needs are our priority.  As part of our continuing mission to provide you with exceptional heart care, we have created designated Provider Care Teams.  These Care Teams include your primary Cardiologist (physician) and Advanced Practice Providers (APPs -  Physician Assistants and Nurse Practitioners) who all work together to provide you with the care you need, when you need it.  We recommend signing up for the patient portal called "MyChart".  Sign up information is provided on this After Visit Summary.  MyChart is used to connect with patients for Virtual Visits (Telemedicine).  Patients are able to view lab/test results, encounter notes, upcoming appointments, etc.  Non-urgent messages can be sent to your provider as well.   To learn more about what you can do with MyChart, go to [email or web address].    Your next appointment: 1 year     Call in May to schedule August appointment     The format for your next appointment: Office   Provider:  Dr.Schumann  [facility] will call with appointment    [phone]   Important Information About Sugar

## 2022-06-07 ENCOUNTER — Telehealth: Payer: Self-pay | Admitting: *Deleted

## 2022-06-07 NOTE — Telephone Encounter (Signed)
Romie Levee notified ok to activate itamar. Per [employer] automated system no PA is required. Confirmation # [id number].

## 2022-06-08 ENCOUNTER — Telehealth: Payer: Self-pay

## 2022-06-08 NOTE — Telephone Encounter (Signed)
Call the patient to speak with her about the [place] Device. I lvm for patient to return call at her earliest convenience.

## 2022-06-08 NOTE — Telephone Encounter (Signed)
Called and made the patient aware that She may proceed with the Itamar Home Sleep Study. PIN # provided to the patient. Patient made aware that She will be contacted after the test has been read with the results and any recommendations. Patient verbalized understanding and thanked me for the call.   

## 2022-06-10 ENCOUNTER — Other Ambulatory Visit: Payer: Self-pay | Admitting: Critical Care Medicine

## 2022-06-11 MED ORDER — ALBUTEROL SULFATE HFA 108 (90 BASE) MCG/ACT IN AERS: 2.0000 | INHALATION_SPRAY | RESPIRATORY_TRACT | 2 refills | Status: DC | PRN

## 2022-06-13 ENCOUNTER — Encounter: Payer: Self-pay | Admitting: Cardiology

## 2022-06-13 ENCOUNTER — Encounter: Payer: Self-pay | Admitting: Critical Care Medicine

## 2022-06-14 NOTE — Telephone Encounter (Signed)
Recommend extra dose of lasix (total 40 mg) x 3 days for her increased swelling, then go back to 20 mg daily

## 2022-06-15 ENCOUNTER — Ambulatory Visit: Payer: Commercial Managed Care - HMO | Attending: Critical Care Medicine | Admitting: Critical Care Medicine

## 2022-06-15 ENCOUNTER — Encounter: Payer: Self-pay | Admitting: Critical Care Medicine

## 2022-06-15 DIAGNOSIS — M4624 Osteomyelitis of vertebra, thoracic region: Secondary | ICD-10-CM

## 2022-06-15 DIAGNOSIS — Z6841 Body Mass Index (BMI) 40.0 and over, adult: Secondary | ICD-10-CM

## 2022-06-15 DIAGNOSIS — Z029 Encounter for administrative examinations, unspecified: Secondary | ICD-10-CM

## 2022-06-15 NOTE — Assessment & Plan Note (Signed)
This is an encounter for administrative services to perform FMLA paperwork documentation was obtained to complete current paperwork

## 2022-06-15 NOTE — Progress Notes (Signed)
Established Patient Office Visit  Subjective:  Patient ID: ALAIRA LEVEL, female    DOB: 1971-02-23  Age: 51 y.o. MRN: [id number] Virtual Visit via Telephone Note  I connected with Stacey Kaiser on 06/15/22 at 10:00 AM EDT by telephone and verified that I am speaking with the correct person using two identifiers.   Consent:  I discussed the limitations, risks, security and privacy concerns of performing an evaluation and management service by telephone and the availability of in person appointments. I also discussed with the patient that there may be a patient responsible charge related to this service. The patient expressed understanding and agreed to proceed.  Location of patient: I am in my office  Location of provider: Patient's at home  Persons participating in the televisit with the patient.  No one else on the call   CC:  FMLA   HPI Stacey Kaiser presents for primary care follow-up.  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  Patient is yet to achieve an appointment with neurosurgery she has to pay off what bills she goes from her previous hospital consultations before they will see her.  Patient is going to receive a sleep study per cardiology.  She was started losartan at the last cardiology visit and potassium was stopped.  She has a IT consultant.  She has a referral to cardiology pharmacy clinic for consideration of Ozempic to help with weight loss.  On arrival blood pressure 128/82.  Patient states her level of dyspnea is improved since she has been on the losartan.  She still is not sleeping well at this time.  Patient only eats 1 meal a day and does not follow a healthy diet.  She did bring in her colon cancer screening kit at this visit.  She has a  mammogram scheduled March 2.  She cannot tolerate to lay flat to get a Pap smear we will have to put this off for now.  Body weight continues to be at a mass index of 63 point  6/28 Patient seen in return follow-up and she suffered an anniversary grief reaction her mother died 5 years ago and she had a cat passed away on her mother's date of death this is triggered a grief reaction its been quite severe.  Patient's had failing health as well and not able to work and this is also contributed to her depression.  She was quite tearful today on the interview.  She was seen by cardiology recently in March with elevated blood pressure readings we had been treating her with 50 mg losartan dose was increased to 100 mg losartan and she is noted improvement with home readings and today on arrival blood pressure 129/86  Patient begin the lifestyle medicine handout at the last visit she is trying to follow a healthier diet has trouble doing any kind of exercise because of severe back pain.  Patient uses gabapentin and nonsteroidals and Robaxin for back pain.  She is off all antibiotics per infectious disease for discitis and has follow-up appoint with neurosurgery in August.  Patient's colon cancer screening was negative but her mammogram showed breast cysts and fibrocystic disease ultrasound and tomographic images of the breast did not show malignancy she will have repeat imaging in 6 months  Patient would like a handicap sticker at this visit  Cardiology wrote  for wegovy but her insurance would not cover this form of Ozempic.  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  This required heavy lifting of boxes up to 50 pounds and standing for 8 to 12 hours a day per shift.  She has severe discitis of the lower back and can no longer perform  these activities and has been out of work since April 2022.  She needs an extension on her FMLA paperwork and she is in the process of applying for full disability long-term she has an upcoming appoint with neurosurgery next month.  She has received medical therapy for discitis and neurosurgery wanted her to wait till antibiotic for course of therapy was completed which it was the spring.  She has pain in the lower back she has very poor balance she has to use a walker to ambulate she is short of breath with exertion she is morbidly obese she is despondent over her change in health status.  He was seen by cardiology a week ago had a blood pressure at that time of 133/80 she maintains the losartan for blood pressure patient is also maintaining furosemide daily albuterol inhaler if needed and sertraline which is helped her depression  Past Medical History:  Diagnosis Date   Diskitis 01/17/2021   Elevated serum creatinine 03/24/2021   Family history of adverse reaction to anesthesia    " my mother had a seizure coming out of cabg surgery "   Morbid obesity ([place])    Morbid obesity with BMI of 50.0-59.9, adult ([place]) 01/17/2021   Scoliosis     Past Surgical History:  Procedure Laterality Date   IR FLUORO GUIDED NEEDLE PLC ASPIRATION/INJECTION LOC  01/18/2021   RADIOLOGY WITH ANESTHESIA N/A 01/18/2021   Procedure: IR WITH ANESTHESIA;  Surgeon: Radiologist, Medication, MD;  Location: [place];  Service: Radiology;  Laterality: N/A;   WISDOM TOOTH EXTRACTION      Family History  Problem Relation Age of Onset   Heart failure Mother    Chronic Renal Failure Mother    Diabetes Mother    Breast cancer Cousin     Social History   Socioeconomic History   Marital status: Soil scientist    Spouse name: Not on file   Number of children: 0   Years of education: Not on file   Highest education level: Not on file  Occupational History   Occupation: retail at [employer]  Tobacco Use   Smoking  status: Never   Smokeless tobacco: Never  Vaping Use   Vaping Use: Never used  Substance and Sexual Activity   Alcohol use: Not Currently    Comment: rare   Drug use: No   Sexual activity: Not on file  Other Topics Concern   Not on file  Social History Narrative   Not on file   Social Determinants of Health   Financial Resource Strain: Medium Risk (05/25/2021)   Overall Financial Resource Strain (CARDIA)    Difficulty of Paying Living Expenses: Somewhat hard  Food Insecurity: No Food Insecurity (05/25/2021)   Hunger Vital Sign    Worried About Running Out of Food in the Last Year: Never true    Ran Out of Food in the Last Year: Never true  Transportation Needs: No Transportation Needs (05/25/2021)  PRAPARE - Hydrologist (Medical): No    Lack of Transportation (Non-Medical): No  Physical Activity: Not on file  Stress: Stress Concern Present (05/25/2021)   [place]    Feeling of Stress : To some extent  Social Connections: Not on file  Intimate Partner Violence: Not on file    Outpatient Medications Prior to Visit  Medication Sig Dispense Refill   albuterol (VENTOLIN HFA) 108 (90 Base) MCG/ACT inhaler Inhale 2 puffs into the lungs every 4 (four) hours as needed for wheezing or shortness of breath. 6.7 g 2   furosemide (LASIX) 20 MG tablet Take 1 tablet (20 mg total) by mouth daily. 30 tablet 2   gabapentin (NEURONTIN) 300 MG capsule Take 2 capsules (600 mg total) by mouth 2 (two) times daily. 60 capsule 4   Glucosamine-Chondroit-Vit C-Mn (GLUCOSAMINE 1500 COMPLEX) CAPS      ibuprofen (ADVIL) 200 MG tablet Take 200 mg by mouth every 6 (six) hours as needed.     losartan (COZAAR) 100 MG tablet Take 1 tablet (100 mg total) by mouth daily. 90 tablet 3   methocarbamol (ROBAXIN) 500 MG tablet Take 1 tablet (500 mg total) by mouth every 6 (six) hours as needed for muscle spasms. 60 tablet 4    Multiple Vitamins-Minerals (ONE-A-DAY WOMENS PETITES PO) Take 1 tablet by mouth daily.     sertraline (ZOLOFT) 50 MG tablet Take 1 tablet (50 mg total) by mouth daily. 30 tablet 3   No facility-administered medications prior to visit.    Allergies  Allergen Reactions   Okra Hives   Other Swelling    Bees and wasps    ROS Review of Systems  Constitutional:  Positive for unexpected weight change. Negative for fatigue.  HENT: Negative.  Negative for ear pain, postnasal drip, rhinorrhea, sinus pressure, sore throat, trouble swallowing and voice change.   Eyes: Negative.   Respiratory:  Negative for apnea, cough, choking, chest tightness, shortness of breath, wheezing and stridor.   Cardiovascular: Negative.  Negative for chest pain, palpitations and leg swelling.  Gastrointestinal: Negative.  Negative for abdominal distention, abdominal pain, nausea and vomiting.  Genitourinary: Negative.   Musculoskeletal:  Positive for back pain. Negative for arthralgias and myalgias.  Skin: Negative.  Negative for rash.  Allergic/Immunologic: Negative.  Negative for environmental allergies and food allergies.  Neurological: Negative.  Negative for dizziness, syncope, weakness and headaches.  Hematological: Negative.  Negative for adenopathy. Does not bruise/bleed easily.  Psychiatric/Behavioral:  Positive for sleep disturbance. Negative for agitation and suicidal ideas. The patient is not nervous/anxious.       Objective:    No exam this is a phone visit Wt Readings from Last 3 Encounters:  06/06/22 286 lb 9.6 oz (130 kg)  04/12/22 289 lb 12.8 oz (131.5 kg)  12/16/21 297 lb 3.2 oz (134.8 kg)     Health Maintenance Due  Topic Date Due   Zoster Vaccines- Shingrix (1 of 2) Never done   COVID-19 Vaccine (4 - Pfizer series) 09/18/2020   INFLUENZA VACCINE  05/16/2022    There are no preventive care reminders to display for this patient.  Lab Results  Component Value Date   TSH 3.660  02/24/2021   Lab Results  Component Value Date   WBC 8.0 03/24/2021   HGB 14.1 03/24/2021   HCT 42.0 03/24/2021   MCV 87.7 03/24/2021   PLT 364 03/24/2021   Lab Results  Component  Value Date   NA 140 06/06/2022   K 4.6 06/06/2022   CO2 25 06/06/2022   GLUCOSE 94 06/06/2022   BUN 9 06/06/2022   CREATININE 0.95 06/06/2022   BILITOT 0.4 02/28/2021   ALKPHOS 68 02/28/2021   AST 21 02/28/2021   ALT 13 02/28/2021   PROT 6.6 02/28/2021   ALBUMIN 3.8 02/28/2021   CALCIUM 9.7 06/06/2022   ANIONGAP 10 02/28/2021   EGFR 73 06/06/2022   No results found for: "CHOL" No results found for: "HDL" No results found for: "LDLCALC" No results found for: "TRIG" No results found for: "CHOLHDL" Lab Results  Component Value Date   HGBA1C 5.9 (H) 12/16/2021      Assessment & Plan:   Problem List Items Addressed This Visit       Other   Administrative encounter    This is an encounter for administrative services to perform FMLA paperwork documentation was obtained to complete current paperwork       Follow Up Instructions: Patient notes follow-up will occur in the near term in December And FMLA paperwork will be completed I discussed the assessment and treatment plan with the patient. The patient was provided an opportunity to ask questions and all were answered. The patient agreed with the plan and demonstrated an understanding of the instructions.   The patient was advised to call back or seek an in-person evaluation if the symptoms worsen or if the condition fails to improve as anticipated.  I provided 20 minutes of non-face-to-face time during this encounter  including  median intraservice time , review of notes, labs, imaging, medications  and explaining diagnosis and management to the patient .    Asencion Noble, MD

## 2022-06-20 ENCOUNTER — Telehealth: Payer: Self-pay

## 2022-06-20 NOTE — Telephone Encounter (Signed)
Called patient to let her know paperwork is ready, faxed it in as well to [employer] and will leave paperwork at front for patient to get.

## 2022-06-28 NOTE — Telephone Encounter (Signed)
Missing attending physician notes pages 4 -7 please re fax forms to fax # [phone]. Please note when completed

## 2022-06-28 NOTE — Telephone Encounter (Signed)
Carly someone from med records/front office has to help me on this  they missed notes pages 4-7

## 2022-06-30 NOTE — Telephone Encounter (Signed)
Refaxed paperwork  

## 2022-07-07 ENCOUNTER — Other Ambulatory Visit: Payer: Self-pay | Admitting: Critical Care Medicine

## 2022-07-07 DIAGNOSIS — R635 Abnormal weight gain: Secondary | ICD-10-CM

## 2022-07-07 DIAGNOSIS — M4644 Discitis, unspecified, thoracic region: Secondary | ICD-10-CM

## 2022-07-07 DIAGNOSIS — R7989 Other specified abnormal findings of blood chemistry: Secondary | ICD-10-CM

## 2022-07-07 NOTE — Telephone Encounter (Signed)
Requested medication (s) are due for refill today: yes  Requested medication (s) are on the active medication list: yes  Last refill:  both meds 04/12/22  Future visit scheduled: yes  Notes to clinic:  sertraline: out of date lab work Methocarbamol: prescription not delegated for NT to reorder   Requested Prescriptions  Pending Prescriptions Disp Refills   sertraline (ZOLOFT) 50 MG tablet [Pharmacy Med Name: Sertraline HCl 50 MG Oral Tablet] 30 tablet     Sig: Take 1 tablet by mouth once daily     Psychiatry:  Antidepressants - SSRI - sertraline Failed - 07/07/2022 12:24 PM      Failed - AST in normal range and within 360 days    AST  Date Value Ref Range Status  02/28/2021 21 15 - 41 U/L Final         Failed - ALT in normal range and within 360 days    ALT  Date Value Ref Range Status  02/28/2021 13 0 - 44 U/L Final         Passed - Completed PHQ-2 or PHQ-9 in the last 360 days      Passed - Valid encounter within last 6 months    Recent Outpatient Visits           3 weeks ago Administrative encounter   [facility]isk, [facility]   2 months ago Primary hypertension   [place] [facility] Storm Frisk, MD   6 months ago Elevated brain natriuretic peptide (BNP) level   [facility] Storm Frisk, MD   9 months ago Osteomyelitis of thoracic vertebra [facility])   [place] [facility] Storm Frisk, MD   1 year ago [facility])   [place] [facility] Storm Frisk, MD       Future Appointments             In 1 month Claiborne Rigg, NP [place] [facility]             methocarbamol (ROBAXIN) 500 MG tablet [Pharmacy Med Name: Methocarbamol 500 MG Oral Tablet] 60 tablet     Sig: TAKE 1 TABLET BY MOUTH EVERY 6 HOURS AS NEEDED FOR MUSCLE SPASM     Not Delegated - Analgesics:   Muscle Relaxants Failed - 07/07/2022 12:24 PM      Failed - This refill cannot be delegated      Passed - Valid encounter within last 6 months    Recent Outpatient Visits           3 weeks ago Administrative encounter   [facility] Storm Frisk, MD   2 months ago Primary hypertension   [place] [facility] Storm Frisk, MD   6 months ago Elevated brain natriuretic peptide (BNP) level   [facility] Storm Frisk, MD   9 months ago Osteomyelitis of thoracic vertebra [facility])   [place] [facility] Storm Frisk, MD   1 year ago Osteomyelitis of thoracic vertebra [facility])   [place] [facility] Storm Frisk, MD       Future Appointments             In 1 month Claiborne Rigg, NP [facility]  Signed Prescriptions Disp Refills   gabapentin (NEURONTIN) 300 MG capsule 120 capsule 0    Sig: Take 2 capsules by mouth twice daily     Neurology: Anticonvulsants - gabapentin Passed - 07/07/2022 12:24 PM      Passed - Cr in normal range and within 360 days    Creat  Date Value Ref Range Status  05/03/2021 1.07 (H) 0.50 - 1.03 mg/dL Final   Creatinine, Ser  Date Value Ref Range Status  06/06/2022 0.95 0.57 - 1.00 mg/dL Final         Passed - Completed PHQ-2 or PHQ-9 in the last 360 days      Passed - Valid encounter within last 12 months    Recent Outpatient Visits           3 weeks ago Administrative encounter   [facility] Storm Frisk, MD   2 months ago Primary hypertension   [place] [facility] Storm Frisk, MD   6 months ago Elevated brain natriuretic peptide (BNP) level   [facility] Storm Frisk, MD   9 months ago Osteomyelitis of thoracic vertebra [facility])   [place] [facility] Storm Frisk, MD   1 year ago Osteomyelitis of thoracic vertebra [facility])   [place] [facility] Storm Frisk, MD       Future Appointments             In 1 month Claiborne Rigg, NP [place] [facility]             furosemide (LASIX) 20 MG tablet 30 tablet 0    Sig: Take 1 tablet by mouth once daily     Cardiovascular:  Diuretics - Loop Passed - 07/07/2022 12:24 PM      Passed - K in normal range and within 180 days    Potassium  Date Value Ref Range Status  06/06/2022 4.6 3.5 - 5.2 mmol/L Final         Passed - Ca in normal range and within 180 days    Calcium  Date Value Ref Range Status  06/06/2022 9.7 8.7 - 10.2 mg/dL Final         Passed - Na in normal range and within 180 days    Sodium  Date Value Ref Range Status  06/06/2022 140 134 - 144 mmol/L Final         Passed - Cr in normal range and within 180 days    Creat  Date Value Ref Range Status  05/03/2021 1.07 (H) 0.50 - 1.03 mg/dL Final   Creatinine, Ser  Date Value Ref Range Status  06/06/2022 0.95 0.57 - 1.00 mg/dL Final         Passed - Cl in normal range and within 180 days    Chloride  Date Value Ref Range Status  06/06/2022 101 96 - 106 mmol/L Final         Passed - Mg Level in normal range and within 180 days    Magnesium  Date Value Ref Range Status  06/06/2022 2.1 1.6 - 2.3 mg/dL Final         Passed - Last BP in normal range    BP Readings from Last 1 Encounters:  06/06/22 133/80         Passed - Valid encounter within last 6 months    Recent  Outpatient Visits           3 weeks ago Administrative encounter   [place] Elsie Stain, MD   2 months ago Primary hypertension   [place] Elsie Stain, MD   6 months ago Elevated brain natriuretic peptide (BNP) level   [place] Elsie Stain, MD   9 months ago  Osteomyelitis of thoracic vertebra [facility])   [place] Elsie Stain, MD   1 year ago Osteomyelitis of thoracic vertebra [facility])   [place] Elsie Stain, MD       Future Appointments             In 1 month Gildardo Pounds, NP [place]

## 2022-07-07 NOTE — Telephone Encounter (Signed)
Requested Prescriptions  Pending Prescriptions Disp Refills  . sertraline (ZOLOFT) 50 MG tablet [Pharmacy Med Name: Sertraline HCl 50 MG Oral Tablet] 30 tablet 0    Sig: Take 1 tablet by mouth once daily     Psychiatry:  Antidepressants - SSRI - sertraline Failed - 07/07/2022 12:24 PM      Failed - AST in normal range and within 360 days    AST  Date Value Ref Range Status  02/28/2021 21 15 - 41 U/L Final         Failed - ALT in normal range and within 360 days    ALT  Date Value Ref Range Status  02/28/2021 13 0 - 44 U/L Final         Passed - Completed PHQ-2 or PHQ-9 in the last 360 days      Passed - Valid encounter within last 6 months    Recent Outpatient Visits          3 weeks ago Administrative encounter   [facility] Storm Frisk, MD   2 months ago Primary hypertension   [place] [facility] Storm Frisk, MD   6 months ago Elevated brain natriuretic peptide (BNP) level   [facility] Storm Frisk, MD   9 months ago Osteomyelitis of thoracic vertebra [facility])   [place] [facility] Storm Frisk, MD   1 year ago Osteomyelitis of thoracic vertebra [facility])   [place] [facility] Storm Frisk, MD      Future Appointments            In 1 month Claiborne Rigg, NP [facility]           . gabapentin (NEURONTIN) 300 MG capsule [Pharmacy Med Name: Gabapentin 300 MG Oral Capsule] 60 capsule 0    Sig: Take 2 capsules by mouth twice daily     Neurology: Anticonvulsants - gabapentin Passed - 07/07/2022 12:24 PM      Passed - Cr in normal range and within 360 days    Creat  Date Value Ref Range Status  05/03/2021 1.07 (H) 0.50 - 1.03 mg/dL Final   Creatinine, Ser  Date Value Ref Range Status  06/06/2022 0.95 0.57 - 1.00 mg/dL Final         Passed - Completed PHQ-2 or PHQ-9 in the last 360 days       Passed - Valid encounter within last 12 months    Recent Outpatient Visits          3 weeks ago Administrative encounter   [facility] Storm Frisk, MD   2 months ago Primary hypertension   [place] [facility] Storm Frisk, MD   6 months ago Elevated brain natriuretic peptide (BNP) level   [facility] Storm Frisk, MD   9 months ago Osteomyelitis of thoracic vertebra [facility])   [place] [facility] Storm Frisk, MD   1 year ago Osteomyelitis of thoracic vertebra [facility])   [place] [facility] Storm Frisk, MD      Future Appointments            In 1 month Claiborne Rigg, NP [facility]           . furosemide (  LASIX) 20 MG tablet [Pharmacy Med Name: Furosemide 20 MG Oral Tablet] 30 tablet 0    Sig: Take 1 tablet by mouth once daily     Cardiovascular:  Diuretics - Loop Passed - 07/07/2022 12:24 PM      Passed - K in normal range and within 180 days    Potassium  Date Value Ref Range Status  06/06/2022 4.6 3.5 - 5.2 mmol/L Final         Passed - Ca in normal range and within 180 days    Calcium  Date Value Ref Range Status  06/06/2022 9.7 8.7 - 10.2 mg/dL Final         Passed - Na in normal range and within 180 days    Sodium  Date Value Ref Range Status  06/06/2022 140 134 - 144 mmol/L Final         Passed - Cr in normal range and within 180 days    Creat  Date Value Ref Range Status  05/03/2021 1.07 (H) 0.50 - 1.03 mg/dL Final   Creatinine, Ser  Date Value Ref Range Status  06/06/2022 0.95 0.57 - 1.00 mg/dL Final         Passed - Cl in normal range and within 180 days    Chloride  Date Value Ref Range Status  06/06/2022 101 96 - 106 mmol/L Final         Passed - Mg Level in normal range and within 180 days    Magnesium  Date Value Ref Range Status  06/06/2022 2.1 1.6 - 2.3  mg/dL Final         Passed - Last BP in normal range    BP Readings from Last 1 Encounters:  06/06/22 133/80         Passed - Valid encounter within last 6 months    Recent Outpatient Visits          3 weeks ago Administrative encounter   [place] Elsie Stain, MD   2 months ago Primary hypertension   [place] Elsie Stain, MD   6 months ago Elevated brain natriuretic peptide (BNP) level   [place] Elsie Stain, MD   9 months ago Osteomyelitis of thoracic vertebra [facility])   [place] Elsie Stain, MD   1 year ago Osteomyelitis of thoracic vertebra [facility])   [place] Elsie Stain, MD      Future Appointments            In 1 month Gildardo Pounds, NP [place]           . methocarbamol (ROBAXIN) 500 MG tablet [Pharmacy Med Name: Methocarbamol 500 MG Oral Tablet] 60 tablet 0    Sig: TAKE 1 TABLET BY MOUTH EVERY 6 HOURS AS NEEDED FOR MUSCLE SPASM     Not Delegated - Analgesics:  Muscle Relaxants Failed - 07/07/2022 12:24 PM      Failed - This refill cannot be delegated      Passed - Valid encounter within last 6 months    Recent Outpatient Visits          3 weeks ago Administrative encounter   [place] Elsie Stain, MD   2 months ago Primary hypertension   [place] [facility] Noble  E, MD   6 months ago Elevated brain natriuretic peptide (BNP) level   [facility] Storm Frisk, MD   9 months ago Osteomyelitis of thoracic vertebra [facility])   [place] [facility] Storm Frisk, MD   1 year ago Osteomyelitis of thoracic vertebra [facility])   [place] [facility] Storm Frisk, MD      Future Appointments             In 1 month Claiborne Rigg, NP [facility]

## 2022-07-11 NOTE — Telephone Encounter (Signed)
Pt called to check status of paperwork, she says she spoke with [employer] and they faxed paperwork to the office yesterday afternoon around 5:00 pm. Pt wants a call back from the clinic to confirm that this was received. Please advise   Best contact: [phone] or even MyChart messaging. She says he only has until October 5th to respond.

## 2022-07-12 ENCOUNTER — Encounter: Payer: Self-pay | Admitting: Critical Care Medicine

## 2022-07-13 ENCOUNTER — Encounter: Payer: Self-pay | Admitting: Critical Care Medicine

## 2022-07-13 NOTE — Telephone Encounter (Signed)
Called patient and she is aware that fax has been sent

## 2022-07-17 ENCOUNTER — Ambulatory Visit
Admission: RE | Admit: 2022-07-17 | Discharge: 2022-07-17 | Disposition: A | Discharge status: Home / Self Care | Payer: Commercial Managed Care - HMO | Source: Ambulatory Visit | Attending: Critical Care Medicine | Admitting: Critical Care Medicine

## 2022-07-17 ENCOUNTER — Other Ambulatory Visit: Payer: Self-pay | Admitting: Critical Care Medicine

## 2022-07-17 DIAGNOSIS — N6310 Unspecified lump in the right breast, unspecified quadrant: Secondary | ICD-10-CM

## 2022-07-18 ENCOUNTER — Telehealth: Payer: Self-pay

## 2022-07-18 NOTE — Telephone Encounter (Signed)
I called the patient to speak with her about the [employer]. I LVM for her to return my call.

## 2022-07-18 NOTE — Telephone Encounter (Deleted)
Patient contacted regarding [place] Sleep Study. Informed patient that [facility] Sleep Study needs to be completed or returned within 2 weeks of today's date. Made patient aware that if the sleep study has not been done within this timeframe and the device has not been returned, that this will be handed over to billing per the signed waiver agreement. Patient verbalized understanding and thanked me for the call.

## 2022-07-18 NOTE — Telephone Encounter (Signed)
Patient contacted regarding [place] Sleep Study. Informed patient that [facility] Sleep Study needs to be completed or returned within 2 weeks of 07-18-2022. Made patient aware that if the sleep study has not been done within this timeframe and the device has not been returned, that this will be handed over to billing per the signed waiver agreement. Patient verbalized understanding and thanked me for the call.

## 2022-07-31 ENCOUNTER — Other Ambulatory Visit: Payer: Self-pay | Admitting: Critical Care Medicine

## 2022-07-31 DIAGNOSIS — M4644 Discitis, unspecified, thoracic region: Secondary | ICD-10-CM

## 2022-08-04 ENCOUNTER — Encounter (HOSPITAL_BASED_OUTPATIENT_CLINIC_OR_DEPARTMENT_OTHER): Payer: Commercial Managed Care - HMO | Admitting: Cardiology

## 2022-08-04 DIAGNOSIS — G4733 Obstructive sleep apnea (adult) (pediatric): Secondary | ICD-10-CM | POA: Diagnosis not present

## 2022-08-06 NOTE — Procedures (Signed)
   Patient Information Study Date: 08/04/22 Patient Name: Stacey Kaiser Patient ID: [id number] Birth Date: 2071/02/25 Age: 51 Gender: Female BMI: 60.2 (W=286 lb, H=4' 10'') Referring Physician: Oswaldo Milian, MD  TEST DESCRIPTION: Home sleep apnea testing was completed using the WatchPat, a Type 1 device, utilizing peripheral arterial tonometry (PAT), chest movement, actigraphy, pulse oximetry, pulse rate, body position and snore. AHI was calculated with apnea and hypopnea using valid sleep time as the denominator. RDI includes apneas, hypopneas, and RERAs. The data acquired and the scoring of sleep and all associated events were performed in accordance with the recommended standards and specifications as outlined in the AASM Manual for the Scoring of Sleep and Associated Events 2.2.0 (2015).   FINDINGS:   1. Mild Obstructive Sleep Apnea with AHI 9.5/hr.   2. No Central Sleep Apnea with pAHIc 0/hr.   3. Oxygen desaturations as low as 86%.   4. Moderate snoring was present. O2 sats were < 88% for 1.6 min.   5. Total sleep time was 5 hrs and 27 min.   6. 27.2% of total sleep time was spent in REM sleep.   7. Shortened sleep onset latency at 5 min.   8. Normal REM sleep onset latency at 92 min.   9. Total awakenings were 6.  10. Arrhythmia detection:  None.  DIAGNOSIS: Mild Obstructive Sleep Apnea (G47.33)  RECOMMENDATIONS:   1.  Clinical correlation of these findings is necessary.  The decision to treat obstructive sleep apnea (OSA) is usually based on the presence of apnea symptoms or the presence of associated medical conditions such as Hypertension, Congestive Heart Failure, Atrial Fibrillation or Obesity.  The most common symptoms of OSA are snoring, gasping for breath while sleeping, daytime sleepiness and fatigue.   2.  Initiating apnea therapy is recommended given the presence of symptoms and/or associated conditions. Recommend proceeding with one of the  following:     a.  Auto-CPAP therapy with a pressure range of 5-20cm H2O.     b.  An oral appliance (OA) that can be obtained from certain dentists with expertise in sleep medicine.  These are primarily of use in non-obese patients with mild and moderate disease.     c.  An ENT consultation which may be useful to look for specific causes of obstruction and possible treatment options.     d.  If patient is intolerant to PAP therapy, consider referral to ENT for evaluation for hypoglossal nerve stimulator.   3.  Close follow-up is necessary to ensure success with CPAP or oral appliance therapy for maximum benefit.  4.  A follow-up oximetry study on CPAP is recommended to assess the adequacy of therapy and determine the need for supplemental oxygen or the potential need for Bi-level therapy.  An arterial blood gas to determine the adequacy of baseline ventilation and oxygenation should also be considered.  5.  Healthy sleep recommendations include:  adequate nightly sleep (normal 7-9 hrs/night), avoidance of caffeine after noon and alcohol near bedtime, and maintaining a sleep environment that is cool, dark and quiet.  6.  Weight loss for overweight patients is recommended.  Even modest amounts of weight loss can significantly improve the severity of sleep apnea.  7.  Snoring recommendations include:  weight loss where appropriate, side sleeping, and avoidance of alcohol before bed.  8.  Operation of motor vehicle should be avoided when sleepy.  Signature: Fransico Him, MD; [facility]; [place] Board of Sleep Medicine Electronically Signed: 08/06/22

## 2022-08-07 ENCOUNTER — Ambulatory Visit: Payer: Commercial Managed Care - HMO | Attending: Cardiology

## 2022-08-07 DIAGNOSIS — I10 Essential (primary) hypertension: Secondary | ICD-10-CM

## 2022-08-07 DIAGNOSIS — R0683 Snoring: Secondary | ICD-10-CM

## 2022-08-10 ENCOUNTER — Other Ambulatory Visit: Payer: Self-pay | Admitting: Critical Care Medicine

## 2022-08-10 DIAGNOSIS — R7989 Other specified abnormal findings of blood chemistry: Secondary | ICD-10-CM

## 2022-08-10 DIAGNOSIS — R635 Abnormal weight gain: Secondary | ICD-10-CM

## 2022-08-12 ENCOUNTER — Other Ambulatory Visit: Payer: Self-pay | Admitting: Critical Care Medicine

## 2022-08-12 DIAGNOSIS — M4644 Discitis, unspecified, thoracic region: Secondary | ICD-10-CM

## 2022-08-14 ENCOUNTER — Ambulatory Visit: Payer: Commercial Managed Care - HMO | Admitting: Nurse Practitioner

## 2022-08-14 ENCOUNTER — Ambulatory Visit: Payer: Commercial Managed Care - HMO | Admitting: Critical Care Medicine

## 2022-08-15 ENCOUNTER — Telehealth: Payer: Self-pay | Admitting: *Deleted

## 2022-08-15 ENCOUNTER — Other Ambulatory Visit: Payer: Self-pay | Admitting: Cardiology

## 2022-08-15 DIAGNOSIS — G4736 Sleep related hypoventilation in conditions classified elsewhere: Secondary | ICD-10-CM

## 2022-08-15 DIAGNOSIS — G4733 Obstructive sleep apnea (adult) (pediatric): Secondary | ICD-10-CM

## 2022-08-15 NOTE — Telephone Encounter (Signed)
Patient returned a call to me and was given her sleep study results and recommendations. She asked if there 's a alternative to using a CPAP machine. I informed her that a CPAP machine is the first choice of treatment, however this is a question for Dr Radford Pax. I will send Dr Radford Pax a message and contact her back with a response.

## 2022-08-15 NOTE — Telephone Encounter (Signed)
-----   Message from Traci R Turner, MD sent at 08/06/2022  7:30 PM EDT ----- Please let patient know that they have sleep apnea and recommend treating with CPAP.  Please order an auto CPAP from 4-15cm H2O with heated humidity and mask of choice.  Order overnight pulse ox on CPAP.  Followup with me in 6 weeks.    

## 2022-08-15 NOTE — Telephone Encounter (Signed)
Attempted to contact the patient to discuss sleep study results and recommendations. Unable to leave a message due to voice mailbox being full. Will send a mychart message to return a call to me.

## 2022-08-15 NOTE — Telephone Encounter (Signed)
Patient notified per Dr Radford Pax that due to her 2022 echo showing pulmonary HTN CPAP is the recommended treatment. Patient states that if her insurance does not cover this, then she cannot pay for it. I told her to wait to decide after she has heard from [place] with the financials before she makes any decision on not to get the device. Patient agrees to wait to hear from [facility].

## 2022-08-15 NOTE — Telephone Encounter (Signed)
-----   Message from Sueanne Margarita, MD sent at 08/06/2022  7:30 PM EDT ----- Please let patient know that they have sleep apnea and recommend treating with CPAP.  Please order an auto CPAP from 4-15cm H2O with heated humidity and mask of choice.  Order overnight pulse ox on CPAP.  Followup with me in 6 weeks.

## 2022-08-27 ENCOUNTER — Other Ambulatory Visit: Payer: Self-pay | Admitting: Critical Care Medicine

## 2022-08-27 DIAGNOSIS — M4644 Discitis, unspecified, thoracic region: Secondary | ICD-10-CM

## 2022-08-28 NOTE — Telephone Encounter (Signed)
Requested Prescriptions  Pending Prescriptions Disp Refills   methocarbamol (ROBAXIN) 500 MG tablet [Pharmacy Med Name: Methocarbamol 500 MG Oral Tablet] 60 tablet 0    Sig: TAKE 1 TABLET BY MOUTH EVERY 6 HOURS AS NEEDED FOR MUSCLE SPASM     Not Delegated - Analgesics:  Muscle Relaxants Failed - 08/27/2022  2:22 PM      Failed - This refill cannot be delegated      Passed - Valid encounter within last 6 months    Recent Outpatient Visits           2 months ago Administrative encounter   [facility] Storm Frisk, MD   4 months ago Primary hypertension   [place] [facility] Storm Frisk, MD   8 months ago Elevated brain natriuretic peptide (BNP) level   [facility] Storm Frisk, MD   11 months ago Osteomyelitis of thoracic vertebra [facility])   [place] [facility] Storm Frisk, MD   1 year ago Osteomyelitis of thoracic vertebra [facility])   [place] [facility] Storm Frisk, MD               gabapentin (NEURONTIN) 300 MG capsule [Pharmacy Med Name: Gabapentin 300 MG Oral Capsule] 360 capsule 0    Sig: Take 2 capsules by mouth twice daily     Neurology: Anticonvulsants - gabapentin Passed - 08/27/2022  2:22 PM      Passed - Cr in normal range and within 360 days    Creat  Date Value Ref Range Status  05/03/2021 1.07 (H) 0.50 - 1.03 mg/dL Final   Creatinine, Ser  Date Value Ref Range Status  06/06/2022 0.95 0.57 - 1.00 mg/dL Final         Passed - Completed PHQ-2 or PHQ-9 in the last 360 days      Passed - Valid encounter within last 12 months    Recent Outpatient Visits           2 months ago Administrative encounter   [facility] Storm Frisk, MD   4 months ago Primary hypertension   [place] [facility] Storm Frisk, MD   8 months ago Elevated brain natriuretic  peptide (BNP) level   [facility] Storm Frisk, MD   11 months ago Osteomyelitis of thoracic vertebra [facility])   [place] [facility] Storm Frisk, MD   1 year ago Osteomyelitis of thoracic vertebra [facility])    [facility] Storm Frisk, MD

## 2022-08-28 NOTE — Telephone Encounter (Signed)
Unable to refill per protocol, Rx request is too soon. Last RF 08/14/22 for 60. Will refuse duplicate request.  Requested Prescriptions  Pending Prescriptions Disp Refills   methocarbamol (ROBAXIN) 500 MG tablet [Pharmacy Med Name: Methocarbamol 500 MG Oral Tablet] 60 tablet 0    Sig: TAKE 1 TABLET BY MOUTH EVERY 6 HOURS AS NEEDED FOR MUSCLE SPASM     Not Delegated - Analgesics:  Muscle Relaxants Failed - 08/27/2022  2:22 PM      Failed - This refill cannot be delegated      Passed - Valid encounter within last 6 months    Recent Outpatient Visits           2 months ago Administrative encounter   [facility] Storm Frisk, MD   4 months ago Primary hypertension   [place] [facility] Storm Frisk, MD   8 months ago Elevated brain natriuretic peptide (BNP) level   [facility] Storm Frisk, MD   11 months ago Osteomyelitis of thoracic vertebra [facility])   [place] [facility] Storm Frisk, MD   1 year ago Osteomyelitis of thoracic vertebra [facility])   [place] [facility] Storm Frisk, MD              Signed Prescriptions Disp Refills   gabapentin (NEURONTIN) 300 MG capsule 360 capsule 0    Sig: Take 2 capsules by mouth twice daily     Neurology: Anticonvulsants - gabapentin Passed - 08/27/2022  2:22 PM      Passed - Cr in normal range and within 360 days    Creat  Date Value Ref Range Status  05/03/2021 1.07 (H) 0.50 - 1.03 mg/dL Final   Creatinine, Ser  Date Value Ref Range Status  06/06/2022 0.95 0.57 - 1.00 mg/dL Final         Passed - Completed PHQ-2 or PHQ-9 in the last 360 days      Passed - Valid encounter within last 12 months    Recent Outpatient Visits           2 months ago Administrative encounter   [facility] Storm Frisk, MD   4 months ago Primary hypertension   [place]  [facility] Storm Frisk, MD   8 months ago Elevated brain natriuretic peptide (BNP) level   [facility] Storm Frisk, MD   11 months ago Osteomyelitis of thoracic vertebra [facility])   [place] [facility] Storm Frisk, MD   1 year ago Osteomyelitis of thoracic vertebra [facility])   [place] [facility] Storm Frisk, MD

## 2022-08-31 ENCOUNTER — Other Ambulatory Visit: Payer: Self-pay | Admitting: Critical Care Medicine

## 2022-08-31 DIAGNOSIS — M4644 Discitis, unspecified, thoracic region: Secondary | ICD-10-CM

## 2022-09-01 MED ORDER — METHOCARBAMOL 500 MG PO TABS: 500.0000 mg | ORAL_TABLET | Freq: Three times a day (TID) | ORAL | 0 refills | Status: DC | PRN

## 2022-09-04 ENCOUNTER — Encounter (HOSPITAL_BASED_OUTPATIENT_CLINIC_OR_DEPARTMENT_OTHER): Payer: Self-pay | Admitting: Cardiology

## 2022-09-04 NOTE — Telephone Encounter (Signed)
Agree with plan 

## 2022-09-07 ENCOUNTER — Other Ambulatory Visit: Payer: Self-pay | Admitting: Critical Care Medicine

## 2022-09-07 DIAGNOSIS — R7989 Other specified abnormal findings of blood chemistry: Secondary | ICD-10-CM

## 2022-09-07 DIAGNOSIS — R635 Abnormal weight gain: Secondary | ICD-10-CM

## 2022-09-08 NOTE — Telephone Encounter (Signed)
Requested medication (s) are due for refill today: yes  Requested medication (s) are on the active medication list: yes  Last refill:  08/10/22 #30 0 refills  Future visit scheduled: no   Notes to clinic:  no refills remain. Do you want to refill Rx?     Requested Prescriptions  Pending Prescriptions Disp Refills   furosemide (LASIX) 20 MG tablet [Pharmacy Med Name: Furosemide 20 MG Oral Tablet] 30 tablet 0    Sig: Take 1 tablet by mouth once daily     Cardiovascular:  Diuretics - Loop Passed - 09/07/2022  9:06 AM      Passed - K in normal range and within 180 days    Potassium  Date Value Ref Range Status  06/06/2022 4.6 3.5 - 5.2 mmol/L Final         Passed - Ca in normal range and within 180 days    Calcium  Date Value Ref Range Status  06/06/2022 9.7 8.7 - 10.2 mg/dL Final         Passed - Na in normal range and within 180 days    Sodium  Date Value Ref Range Status  06/06/2022 140 134 - 144 mmol/L Final         Passed - Cr in normal range and within 180 days    Creat  Date Value Ref Range Status  05/03/2021 1.07 (H) 0.50 - 1.03 mg/dL Final   Creatinine, Ser  Date Value Ref Range Status  06/06/2022 0.95 0.57 - 1.00 mg/dL Final         Passed - Cl in normal range and within 180 days    Chloride  Date Value Ref Range Status  06/06/2022 101 96 - 106 mmol/L Final         Passed - Mg Level in normal range and within 180 days    Magnesium  Date Value Ref Range Status  06/06/2022 2.1 1.6 - 2.3 mg/dL Final         Passed - Last BP in normal range    BP Readings from Last 1 Encounters:  06/06/22 133/80         Passed - Valid encounter within last 6 months    Recent Outpatient Visits           2 months ago Administrative encounter   [facility] Storm Frisk, MD   4 months ago Primary hypertension   [facility] Storm Frisk, MD   8 months ago Elevated brain natriuretic peptide (BNP)  level   [facility] Storm Frisk, MD   12 months ago Osteomyelitis of thoracic vertebra [facility])   [facility] Storm Frisk, MD   1 year ago Osteomyelitis of thoracic vertebra [facility])   [facility] Storm Frisk, MD

## 2022-09-10 ENCOUNTER — Telehealth: Payer: Commercial Managed Care - HMO | Admitting: Nurse Practitioner

## 2022-09-10 DIAGNOSIS — J40 Bronchitis, not specified as acute or chronic: Secondary | ICD-10-CM | POA: Diagnosis not present

## 2022-09-10 MED ORDER — AZITHROMYCIN 250 MG PO TABS: ORAL_TABLET | ORAL | 0 refills | Status: AC

## 2022-09-10 MED ORDER — PREDNISONE 20 MG PO TABS: 20.0000 mg | ORAL_TABLET | Freq: Every day | ORAL | 0 refills | Status: DC

## 2022-09-10 NOTE — Progress Notes (Signed)
Virtual Visit Consent   Stacey Kaiser, you are scheduled for a virtual visit with a [facility] provider today. Just as with appointments in the office, your consent must be obtained to participate. Your consent will be active for this visit and any virtual visit you may have with one of our providers in the next 365 days. If you have a MyChart account, a copy of this consent can be sent to you electronically.  As this is a virtual visit, video technology does not allow for your provider to perform a traditional examination. This may limit your provider's ability to fully assess your condition. If your provider identifies any concerns that need to be evaluated in person or the need to arrange testing (such as labs, EKG, etc.), we will make arrangements to do so. Although advances in technology are sophisticated, we cannot ensure that it will always work on either your end or our end. If the connection with a video visit is poor, the visit may have to be switched to a telephone visit. With either a video or telephone visit, we are not always able to ensure that we have a secure connection.  By engaging in this virtual visit, you consent to the provision of healthcare and authorize for your insurance to be billed (if applicable) for the services provided during this visit. Depending on your insurance coverage, you may receive a charge related to this service.  I need to obtain your verbal consent now. Are you willing to proceed with your visit today? Stacey Kaiser has provided verbal consent on 09/10/2022 for a virtual visit (video or telephone). Stacey Rigg, NP  Date: 09/10/2022 5:04 PM  Virtual Visit via Video Note   I, Stacey Kaiser, connected with  Stacey Kaiser  ([id number], 06-15-1971) on 09/10/22 at  5:00 PM EST by a video-enabled telemedicine application and verified that I am speaking with the correct person using two identifiers.  Location: Patient:  Virtual Visit Location Patient: Home Provider: Virtual Visit Location Provider: Home Office   I discussed the limitations of evaluation and management by telemedicine and the availability of in person appointments. The patient expressed understanding and agreed to proceed.    History of Present Illness: Stacey Kaiser is a 51 y.o. who identifies as a female who was assigned female at birth, and is being seen today for bronchitis.  "I feel miserable". Patient presents for presents evaluation of  productive cough, sore throat, rhinorrhea, chest congestion and fatigue . Symptoms began 1 week ago and are gradually worsening since that time.  Past history is significant for no history of pneumonia or bronchitis. She is not a smoker.  Problems:  Patient Active Problem List   Diagnosis Date Noted   Administrative encounter 06/15/2022   Situational depression 04/12/2022   Snoring 12/13/2021   Hypertension 09/13/2021   Osteomyelitis of thoracic vertebra (HCC) 01/17/2021   BMI 60.0-69.9, adult (HCC) 01/17/2021    Allergies:  Allergies  Allergen Reactions   Okra Hives   Other Swelling    Bees and wasps   Medications:  Current Outpatient Medications:    azithromycin (ZITHROMAX) 250 MG tablet, Take 2 tablets on day 1, then 1 tablet daily on days 2 through 5, Disp: 6 tablet, Rfl: 0   predniSONE (DELTASONE) 20 MG tablet, Take 1 tablet (20 mg total) by mouth daily with breakfast., Disp: 7 tablet, Rfl: 0   albuterol (VENTOLIN HFA) 108 (90 Base) MCG/ACT inhaler, Inhale 2 puffs into the  lungs every 4 (four) hours as needed for wheezing or shortness of breath., Disp: 6.7 g, Rfl: 2   furosemide (LASIX) 20 MG tablet, Take 1 tablet by mouth once daily, Disp: 30 tablet, Rfl: 0   gabapentin (NEURONTIN) 300 MG capsule, Take 2 capsules by mouth twice daily, Disp: 360 capsule, Rfl: 0   Glucosamine-Chondroit-Vit C-Mn (GLUCOSAMINE 1500 COMPLEX) CAPS, , Disp: , Rfl:    ibuprofen (ADVIL) 200 MG tablet,  Take 200 mg by mouth every 6 (six) hours as needed., Disp: , Rfl:    losartan (COZAAR) 100 MG tablet, Take 1 tablet (100 mg total) by mouth daily., Disp: 90 tablet, Rfl: 3   methocarbamol (ROBAXIN) 500 MG tablet, Take 1 tablet (500 mg total) by mouth every 8 (eight) hours as needed for muscle spasms., Disp: 60 tablet, Rfl: 0   Multiple Vitamins-Minerals (ONE-A-DAY WOMENS PETITES PO), Take 1 tablet by mouth daily., Disp: , Rfl:    sertraline (ZOLOFT) 50 MG tablet, Take 1 tablet by mouth once daily, Disp: 30 tablet, Rfl: 1  Observations/Objective: Patient is well-developed, well-nourished in no acute distress.  Resting comfortably at home.  Head is normocephalic, atraumatic.  No labored breathing.  Speech is clear and coherent with logical content.  Patient is alert and oriented at baseline.    Assessment and Plan: 1. Bronchitis - azithromycin (ZITHROMAX) 250 MG tablet; Take 2 tablets on day 1, then 1 tablet daily on days 2 through 5  Dispense: 6 tablet; Refill: 0 - predniSONE (DELTASONE) 20 MG tablet; Take 1 tablet (20 mg total) by mouth daily with breakfast.  Dispense: 7 tablet; Refill: 0  Can take coricidin HBP OTC INSTRUCTIONS: use a humidifier for nasal congestion Drink plenty of fluids, rest and wash hands frequently to avoid the spread of infection Alternate tylenol and Motrin for relief of fever   Follow Up Instructions: I discussed the assessment and treatment plan with the patient. The patient was provided an opportunity to ask questions and all were answered. The patient agreed with the plan and demonstrated an understanding of the instructions.  A copy of instructions were sent to the patient via MyChart unless otherwise noted below.    The patient was advised to call back or seek an in-person evaluation if the symptoms worsen or if the condition fails to improve as anticipated.  Time:  I spent 11 minutes with the patient via telehealth technology discussing the above  problems/concerns.    Stacey Rigg, NP

## 2022-09-10 NOTE — Patient Instructions (Signed)
Stacey Kaiser, thank you for joining Claiborne Rigg, NP for today's virtual visit.  While this provider is not your primary care provider (PCP), if your PCP is located in our provider database this encounter information will be shared with them immediately following your visit.   A [facility] MyChart account gives you access to today's visit and all your visits, tests, and labs performed at [facility] " click here if you don't have a [facility] MyChart account or go to [email or web address]  Consent: (Patient) Stacey Kaiser provided verbal consent for this virtual visit at the beginning of the encounter.  Current Medications:  Current Outpatient Medications:    azithromycin (ZITHROMAX) 250 MG tablet, Take 2 tablets on day 1, then 1 tablet daily on days 2 through 5, Disp: 6 tablet, Rfl: 0   predniSONE (DELTASONE) 20 MG tablet, Take 1 tablet (20 mg total) by mouth daily with breakfast., Disp: 7 tablet, Rfl: 0   albuterol (VENTOLIN HFA) 108 (90 Base) MCG/ACT inhaler, Inhale 2 puffs into the lungs every 4 (four) hours as needed for wheezing or shortness of breath., Disp: 6.7 g, Rfl: 2   furosemide (LASIX) 20 MG tablet, Take 1 tablet by mouth once daily, Disp: 30 tablet, Rfl: 0   gabapentin (NEURONTIN) 300 MG capsule, Take 2 capsules by mouth twice daily, Disp: 360 capsule, Rfl: 0   Glucosamine-Chondroit-Vit C-Mn (GLUCOSAMINE 1500 COMPLEX) CAPS, , Disp: , Rfl:    ibuprofen (ADVIL) 200 MG tablet, Take 200 mg by mouth every 6 (six) hours as needed., Disp: , Rfl:    losartan (COZAAR) 100 MG tablet, Take 1 tablet (100 mg total) by mouth daily., Disp: 90 tablet, Rfl: 3   methocarbamol (ROBAXIN) 500 MG tablet, Take 1 tablet (500 mg total) by mouth every 8 (eight) hours as needed for muscle spasms., Disp: 60 tablet, Rfl: 0   Multiple Vitamins-Minerals (ONE-A-DAY WOMENS PETITES PO), Take 1 tablet by mouth daily., Disp: , Rfl:    sertraline (ZOLOFT) 50 MG tablet,  Take 1 tablet by mouth once daily, Disp: 30 tablet, Rfl: 1   Medications ordered in this encounter:  Meds ordered this encounter  Medications   azithromycin (ZITHROMAX) 250 MG tablet    Sig: Take 2 tablets on day 1, then 1 tablet daily on days 2 through 5    Dispense:  6 tablet    Refill:  0    Order Specific Question:   Supervising Provider    Answer:   Merrilee Jansky [[id number]]   predniSONE (DELTASONE) 20 MG tablet    Sig: Take 1 tablet (20 mg total) by mouth daily with breakfast.    Dispense:  7 tablet    Refill:  0    Order Specific Question:   Supervising Provider    Answer:   Merrilee Jansky [id number]     *If you need refills on other medications prior to your next appointment, please contact your pharmacy*  Follow-Up: Call back or seek an in-person evaluation if the symptoms worsen or if the condition fails to improve as anticipated.  [facility] [phone]  Other Instructions Can take coricidin HBP OTC INSTRUCTIONS: use a humidifier for nasal congestion Drink plenty of fluids, rest and wash hands frequently to avoid the spread of infection Alternate tylenol and Motrin for relief of fever    If you have been instructed to have an in-person evaluation today at a local Urgent Care facility, please use the link below. It will  take you to a list of all of our available [facility], including address, phone number and hours of operation. Please do not delay care.  [facility]  If you or a family member do not have a primary care provider, use the link below to schedule a visit and establish care. When you choose a [facility] primary care physician or advanced practice provider, you gain a long-term partner in health. Find a Primary Care Provider  Learn more about [facility]'s in-office and virtual care options: [facility]

## 2022-10-02 ENCOUNTER — Other Ambulatory Visit: Payer: Self-pay | Admitting: Family Medicine

## 2022-10-02 DIAGNOSIS — M4644 Discitis, unspecified, thoracic region: Secondary | ICD-10-CM

## 2022-10-12 ENCOUNTER — Other Ambulatory Visit: Payer: Self-pay | Admitting: Critical Care Medicine

## 2022-10-18 ENCOUNTER — Other Ambulatory Visit: Payer: Self-pay | Admitting: Critical Care Medicine

## 2022-10-18 DIAGNOSIS — R635 Abnormal weight gain: Secondary | ICD-10-CM

## 2022-10-18 DIAGNOSIS — M4644 Discitis, unspecified, thoracic region: Secondary | ICD-10-CM

## 2022-10-18 DIAGNOSIS — R7989 Other specified abnormal findings of blood chemistry: Secondary | ICD-10-CM

## 2022-10-27 ENCOUNTER — Encounter: Payer: Self-pay | Admitting: Critical Care Medicine

## 2022-10-28 ENCOUNTER — Other Ambulatory Visit: Payer: Self-pay | Admitting: Critical Care Medicine

## 2022-10-28 DIAGNOSIS — R7989 Other specified abnormal findings of blood chemistry: Secondary | ICD-10-CM

## 2022-10-28 DIAGNOSIS — M4644 Discitis, unspecified, thoracic region: Secondary | ICD-10-CM

## 2022-10-28 DIAGNOSIS — R635 Abnormal weight gain: Secondary | ICD-10-CM

## 2022-10-30 ENCOUNTER — Other Ambulatory Visit: Payer: Self-pay | Admitting: Critical Care Medicine

## 2022-10-30 DIAGNOSIS — R635 Abnormal weight gain: Secondary | ICD-10-CM

## 2022-10-30 DIAGNOSIS — R7989 Other specified abnormal findings of blood chemistry: Secondary | ICD-10-CM

## 2022-10-30 DIAGNOSIS — M4644 Discitis, unspecified, thoracic region: Secondary | ICD-10-CM

## 2022-10-30 NOTE — Telephone Encounter (Signed)
Requested medication (s) are due for refill today: yes  Requested medication (s) are on the active medication list:yes  Last refill:  multiple dates  Future visit scheduled:no  Notes to clinic:  Unable to refill per protocol, courtesy refill already given, routing for provider approval.

## 2022-10-31 MED ORDER — FUROSEMIDE 20 MG PO TABS: 20.0000 mg | ORAL_TABLET | Freq: Every day | ORAL | 0 refills | Status: DC

## 2022-10-31 MED ORDER — METHOCARBAMOL 500 MG PO TABS: 500.0000 mg | ORAL_TABLET | Freq: Three times a day (TID) | ORAL | 0 refills | Status: DC | PRN

## 2022-10-31 MED ORDER — SERTRALINE HCL 50 MG PO TABS: 50.0000 mg | ORAL_TABLET | Freq: Every day | ORAL | 0 refills | Status: DC

## 2022-10-31 MED ORDER — GABAPENTIN 300 MG PO CAPS: 600.0000 mg | ORAL_CAPSULE | Freq: Two times a day (BID) | ORAL | 0 refills | Status: DC

## 2022-11-26 ENCOUNTER — Other Ambulatory Visit: Payer: Self-pay | Admitting: Family Medicine

## 2022-11-26 DIAGNOSIS — M4644 Discitis, unspecified, thoracic region: Secondary | ICD-10-CM

## 2022-11-26 DIAGNOSIS — R7989 Other specified abnormal findings of blood chemistry: Secondary | ICD-10-CM

## 2022-11-26 DIAGNOSIS — R635 Abnormal weight gain: Secondary | ICD-10-CM

## 2022-11-27 NOTE — Telephone Encounter (Signed)
Requested medication (s) are due for refill today: yes  Requested medication (s) are on the active medication list: yes  Last refill:  10/31/22  Future visit scheduled: yes  Notes to clinic:  Unable to refill per protocol, courtesy refill already given, routing for provider approval.      Requested Prescriptions  Pending Prescriptions Disp Refills   furosemide (LASIX) 20 MG tablet [Pharmacy Med Name: Furosemide 20 MG Oral Tablet] 30 tablet 0    Sig: Take 1 tablet by mouth once daily     Cardiovascular:  Diuretics - Loop Failed - 11/26/2022  9:14 AM      Failed - Valid encounter within last 6 months    Recent Outpatient Visits           5 months ago Administrative encounter   [place] Elsie Stain, MD   7 months ago Primary hypertension   [place] Elsie Stain, MD   11 months ago Elevated brain natriuretic peptide (BNP) level   [place] Elsie Stain, MD   1 year ago Osteomyelitis of thoracic vertebra [facility])   [place] Elsie Stain, MD   1 year ago Osteomyelitis of thoracic vertebra [facility])   [place] Elsie Stain, MD              Passed - K in normal range and within 180 days    Potassium  Date Value Ref Range Status  06/06/2022 4.6 3.5 - 5.2 mmol/L Final         Passed - Ca in normal range and within 180 days    Calcium  Date Value Ref Range Status  06/06/2022 9.7 8.7 - 10.2 mg/dL Final         Passed - Na in normal range and within 180 days    Sodium  Date Value Ref Range Status  06/06/2022 140 134 - 144 mmol/L Final         Passed - Cr in normal range and within 180 days    Creat  Date Value Ref Range Status  05/03/2021 1.07 (H) 0.50 - 1.03 mg/dL Final   Creatinine, Ser  Date Value Ref Range Status  06/06/2022 0.95 0.57 - 1.00 mg/dL Final          Passed - Cl in normal range and within 180 days    Chloride  Date Value Ref Range Status  06/06/2022 101 96 - 106 mmol/L Final         Passed - Mg Level in normal range and within 180 days    Magnesium  Date Value Ref Range Status  06/06/2022 2.1 1.6 - 2.3 mg/dL Final         Passed - Last BP in normal range    BP Readings from Last 1 Encounters:  06/06/22 133/80          sertraline (ZOLOFT) 50 MG tablet [Pharmacy Med Name: Sertraline HCl 50 MG Oral Tablet] 30 tablet 0    Sig: TAKE 1 TABLET BY MOUTH ONCE DAILY . APPOINTMENT REQUIRED FOR FUTURE REFILLS     Psychiatry:  Antidepressants - SSRI - sertraline Failed - 11/26/2022  9:14 AM      Failed - AST in normal range and within 360 days    AST  Date Value Ref Range Status  02/28/2021 21 15 -  41 U/L Final         Failed - ALT in normal range and within 360 days    ALT  Date Value Ref Range Status  02/28/2021 13 0 - 44 U/L Final         Failed - Valid encounter within last 6 months    Recent Outpatient Visits           5 months ago Administrative encounter   [place], MD   7 months ago Primary hypertension   [place] Elsie Stain, MD   11 months ago Elevated brain natriuretic peptide (BNP) level   [facility] Elsie Stain, MD   1 year ago Osteomyelitis of thoracic vertebra [facility])   [place] Elsie Stain, MD   1 year ago Osteomyelitis of thoracic vertebra [facility])   [place] Elsie Stain, MD              Passed - Completed PHQ-2 or PHQ-9 in the last 360 days       methocarbamol (ROBAXIN) 500 MG tablet [Pharmacy Med Name: Methocarbamol 500 MG Oral Tablet] 60 tablet 0    Sig: TAKE 1 TABLET BY MOUTH EVERY 8 HOURS AS NEEDED FOR MUSCLE SPASM . APPOINTMENT REQUIRED FOR FUTURE REFILLS     Not  Delegated - Analgesics:  Muscle Relaxants Failed - 11/26/2022  9:14 AM      Failed - This refill cannot be delegated      Failed - Valid encounter within last 6 months    Recent Outpatient Visits           5 months ago Administrative encounter   [place], MD   7 months ago Primary hypertension   [place] Elsie Stain, MD   11 months ago Elevated brain natriuretic peptide (BNP) level   [facility] Elsie Stain, MD   1 year ago Osteomyelitis of thoracic vertebra [facility])   [place] Elsie Stain, MD   1 year ago Osteomyelitis of thoracic vertebra [facility])   [place] Elsie Stain, MD

## 2022-12-08 ENCOUNTER — Other Ambulatory Visit: Payer: Self-pay | Admitting: Family Medicine

## 2022-12-08 NOTE — Telephone Encounter (Signed)
Requested medication (s) are due for refill today - yes  Requested medication (s) are on the active medication list -yes  Future visit scheduled -yes  Last refill: 10/31/22 #30  Notes to clinic: Courtesy RF given- Rx has notes- sent for review of request   Requested Prescriptions  Pending Prescriptions Disp Refills   sertraline (ZOLOFT) 50 MG tablet [Pharmacy Med Name: Sertraline HCl 50 MG Oral Tablet] 30 tablet 0    Sig: TAKE 1 TABLET BY MOUTH ONCE DAILY . APPOINTMENT REQUIRED FOR FUTURE REFILLS     Psychiatry:  Antidepressants - SSRI - sertraline Failed - 12/08/2022  2:48 PM      Failed - AST in normal range and within 360 days    AST  Date Value Ref Range Status  02/28/2021 21 15 - 41 U/L Final         Failed - ALT in normal range and within 360 days    ALT  Date Value Ref Range Status  02/28/2021 13 0 - 44 U/L Final         Failed - Valid encounter within last 6 months    Recent Outpatient Visits           5 months ago Administrative encounter   [place] Elsie Stain, MD   8 months ago Primary hypertension   [place] Elsie Stain, MD   12 months ago Elevated brain natriuretic peptide (BNP) level   [facility] Elsie Stain, MD   1 year ago Osteomyelitis of thoracic vertebra [facility])   [place] Elsie Stain, MD   1 year ago Osteomyelitis of thoracic vertebra [facility])   [place] Elsie Stain, MD              Passed - Completed PHQ-2 or PHQ-9 in the last 360 days         Requested Prescriptions  Pending Prescriptions Disp Refills   sertraline (ZOLOFT) 50 MG tablet [Pharmacy Med Name: Sertraline HCl 50 MG Oral Tablet] 30 tablet 0    Sig: TAKE 1 TABLET BY MOUTH ONCE DAILY . APPOINTMENT REQUIRED FOR FUTURE REFILLS     Psychiatry:  Antidepressants - SSRI -  sertraline Failed - 12/08/2022  2:48 PM      Failed - AST in normal range and within 360 days    AST  Date Value Ref Range Status  02/28/2021 21 15 - 41 U/L Final         Failed - ALT in normal range and within 360 days    ALT  Date Value Ref Range Status  02/28/2021 13 0 - 44 U/L Final         Failed - Valid encounter within last 6 months    Recent Outpatient Visits           5 months ago Administrative encounter   [place] Elsie Stain, MD   8 months ago Primary hypertension   [place] Elsie Stain, MD   12 months ago Elevated brain natriuretic peptide (BNP) level   [facility] Elsie Stain, MD   1 year ago Osteomyelitis of thoracic vertebra [facility])   [place] Elsie Stain, MD   1 year  ago Osteomyelitis of thoracic vertebra [facility])   [place] Elsie Stain, MD              Passed - Completed PHQ-2 or PHQ-9 in the last 360 days

## 2022-12-12 ENCOUNTER — Other Ambulatory Visit: Payer: Self-pay | Admitting: Family Medicine

## 2022-12-12 DIAGNOSIS — R7989 Other specified abnormal findings of blood chemistry: Secondary | ICD-10-CM

## 2022-12-12 DIAGNOSIS — R635 Abnormal weight gain: Secondary | ICD-10-CM

## 2022-12-12 DIAGNOSIS — M4644 Discitis, unspecified, thoracic region: Secondary | ICD-10-CM

## 2022-12-12 MED ORDER — FUROSEMIDE 20 MG PO TABS: 20.0000 mg | ORAL_TABLET | Freq: Every day | ORAL | 0 refills | Status: DC

## 2022-12-12 MED ORDER — SERTRALINE HCL 50 MG PO TABS: 50.0000 mg | ORAL_TABLET | Freq: Every day | ORAL | 0 refills | Status: DC

## 2022-12-12 MED ORDER — GABAPENTIN 300 MG PO CAPS: 600.0000 mg | ORAL_CAPSULE | Freq: Two times a day (BID) | ORAL | 0 refills | Status: DC

## 2022-12-18 ENCOUNTER — Encounter (INDEPENDENT_AMBULATORY_CARE_PROVIDER_SITE_OTHER): Payer: Self-pay | Admitting: Family Medicine

## 2022-12-18 ENCOUNTER — Ambulatory Visit (INDEPENDENT_AMBULATORY_CARE_PROVIDER_SITE_OTHER): Payer: Commercial Managed Care - HMO | Admitting: Family Medicine

## 2022-12-18 VITALS — BP 138/85 | HR 89 | Temp 98.2°F | Ht <= 58 in | Wt 304.0 lb

## 2022-12-18 DIAGNOSIS — I10 Essential (primary) hypertension: Secondary | ICD-10-CM

## 2022-12-18 DIAGNOSIS — R739 Hyperglycemia, unspecified: Secondary | ICD-10-CM

## 2022-12-18 DIAGNOSIS — G8929 Other chronic pain: Secondary | ICD-10-CM

## 2022-12-18 DIAGNOSIS — E669 Obesity, unspecified: Secondary | ICD-10-CM

## 2022-12-18 DIAGNOSIS — M546 Pain in thoracic spine: Secondary | ICD-10-CM | POA: Diagnosis not present

## 2022-12-18 DIAGNOSIS — Z0289 Encounter for other administrative examinations: Secondary | ICD-10-CM

## 2022-12-24 ENCOUNTER — Other Ambulatory Visit: Payer: Self-pay | Admitting: Critical Care Medicine

## 2022-12-24 DIAGNOSIS — R635 Abnormal weight gain: Secondary | ICD-10-CM

## 2022-12-24 DIAGNOSIS — R7989 Other specified abnormal findings of blood chemistry: Secondary | ICD-10-CM

## 2022-12-26 ENCOUNTER — Other Ambulatory Visit: Payer: Self-pay | Admitting: Critical Care Medicine

## 2022-12-26 ENCOUNTER — Other Ambulatory Visit: Payer: Self-pay | Admitting: Family Medicine

## 2022-12-26 DIAGNOSIS — R635 Abnormal weight gain: Secondary | ICD-10-CM

## 2022-12-26 DIAGNOSIS — R7989 Other specified abnormal findings of blood chemistry: Secondary | ICD-10-CM

## 2022-12-26 DIAGNOSIS — M4644 Discitis, unspecified, thoracic region: Secondary | ICD-10-CM

## 2022-12-26 MED ORDER — GABAPENTIN 300 MG PO CAPS: 600.0000 mg | ORAL_CAPSULE | Freq: Two times a day (BID) | ORAL | 0 refills | Status: DC

## 2022-12-26 MED ORDER — FUROSEMIDE 20 MG PO TABS: 20.0000 mg | ORAL_TABLET | Freq: Every day | ORAL | 0 refills | Status: DC

## 2022-12-26 MED ORDER — ALBUTEROL SULFATE HFA 108 (90 BASE) MCG/ACT IN AERS: 2.0000 | INHALATION_SPRAY | RESPIRATORY_TRACT | 2 refills | Status: DC | PRN

## 2022-12-26 MED ORDER — SERTRALINE HCL 50 MG PO TABS: 50.0000 mg | ORAL_TABLET | Freq: Every day | ORAL | 0 refills | Status: DC

## 2022-12-26 MED ORDER — GLUCOSAMINE 1500 COMPLEX PO CAPS: 1.0000 | ORAL_CAPSULE | Freq: Every day | ORAL | 2 refills | Status: DC

## 2022-12-26 MED ORDER — METHOCARBAMOL 500 MG PO TABS: 500.0000 mg | ORAL_TABLET | Freq: Three times a day (TID) | ORAL | 0 refills | Status: DC | PRN

## 2023-01-01 ENCOUNTER — Ambulatory Visit (INDEPENDENT_AMBULATORY_CARE_PROVIDER_SITE_OTHER): Payer: Commercial Managed Care - HMO | Admitting: Family Medicine

## 2023-01-01 ENCOUNTER — Telehealth (INDEPENDENT_AMBULATORY_CARE_PROVIDER_SITE_OTHER): Payer: Self-pay | Admitting: *Deleted

## 2023-01-01 ENCOUNTER — Encounter (INDEPENDENT_AMBULATORY_CARE_PROVIDER_SITE_OTHER): Payer: Self-pay | Admitting: Family Medicine

## 2023-01-01 VITALS — BP 139/77 | HR 79 | Temp 97.6°F | Ht <= 58 in | Wt 305.0 lb

## 2023-01-01 DIAGNOSIS — R0609 Other forms of dyspnea: Secondary | ICD-10-CM | POA: Insufficient documentation

## 2023-01-01 DIAGNOSIS — G4733 Obstructive sleep apnea (adult) (pediatric): Secondary | ICD-10-CM

## 2023-01-01 DIAGNOSIS — R5383 Other fatigue: Secondary | ICD-10-CM

## 2023-01-01 DIAGNOSIS — Z1331 Encounter for screening for depression: Secondary | ICD-10-CM | POA: Diagnosis not present

## 2023-01-01 DIAGNOSIS — R0602 Shortness of breath: Secondary | ICD-10-CM | POA: Diagnosis not present

## 2023-01-01 DIAGNOSIS — Z6841 Body Mass Index (BMI) 40.0 and over, adult: Secondary | ICD-10-CM

## 2023-01-01 DIAGNOSIS — R739 Hyperglycemia, unspecified: Secondary | ICD-10-CM | POA: Insufficient documentation

## 2023-01-01 DIAGNOSIS — E559 Vitamin D deficiency, unspecified: Secondary | ICD-10-CM

## 2023-01-01 NOTE — Telephone Encounter (Signed)
Patient was given copy of her signed late arrival policy and a copy of the medication refill policy.

## 2023-01-01 NOTE — Progress Notes (Unsigned)
Office: [phone]  /  Fax: [phone]   Initial Visit  Stacey Kaiser was seen in clinic today to evaluate for obesity. She is interested in losing weight to improve overall health and reduce the risk of weight related complications. She presents today to review program treatment options, initial physical assessment, and evaluation.     She was referred by: PCP  When asked what else they would like to accomplish? She states: Improve existing medical conditions, Reduce risk for a surgery, and Improve quality of life  When asked how has your weight affected you? She states: Contributed to medical problems and Contributed to orthopedic problems or mobility issues  Some associated conditions: Hypertension, Arthritis, OSA, and Other: hyperglycemia  Contributing factors: Medications and Other: vertebral fractures with decreased mobility. She walks with a walker.  Weight promoting medications identified: Psychotropic medications and Other: gabapentin and Robaxin  Current nutrition plan: None  Current level of physical activity: Other: she is very limited due to pain, allowed to walk 10 minutes daily by Ortho currently.  Current or previous pharmacotherapy: None  Response to medication: Never tried medications   Past medical history includes:   Past Medical History:  Diagnosis Date   Back pain    Depression    Diskitis 01/17/2021   Elevated serum creatinine 03/24/2021   Family history of adverse reaction to anesthesia    " my mother had a seizure coming out of cabg surgery "   Hypertension    Joint pain    Morbid obesity ([place])    Morbid obesity with BMI of 50.0-59.9, adult ([place]) 01/17/2021   Pre-diabetes    Scoliosis    Sleep apnea    SOB (shortness of breath)    Swelling of both lower extremities      Objective:   BP 138/85   Pulse 89   Temp 98.2 F (36.8 C)   Ht 4\' 9"  (1.448 m)   Wt (!) 304 lb (137.9 kg)   SpO2 94%   BMI 65.78 kg/m  She was  weighed on the bioimpedance scale: Body mass index is 65.78 kg/m.  Peak Weight:313 ,Visceral Fat Rating:31, Body Fat%:64.8, Weight trend over the last 12 months: Increasing  Pleasant female with discomfort sitting due to pain. She ambulates with a walker. Mild shortness of breath with exertion.   Assessment and Plan:  1. Primary hypertension Stacey Kaiser's blood pressure is borderline elevated. She is in chronic pain, and she is on losartan.   Stacey Kaiser start working on healthy diet after further testing to see what is the healthiest for her. Her goal is to improve her blood pressure with weight loss.   2. Chronic bilateral thoracic back pain Stacey Kaiser's diagnosis is secondary to osteomyelitis of thoracic vertebra and diskitis of thoracic region. She is limited in activity due to her chronic pain.   Stacey Kaiser Kaiser work on healthy weight loss with appropriate nutrition first and then increase activity as tolerated after she has less pressure on her joints. We Kaiser follow up in 2 weeks to start further testing.   3. Hyperglycemia Stacey Kaiser has a history of multiple elevated glucose and A1c readings, which is likely contributing to her weight struggles.   Stacey Kaiser is to get fasting labs done at her next visit, and we Kaiser check her RMR. Start her on an appropriate eating plan with frequent and close follow-up.   4. Obesity with starting BMI of 65.8 We reviewed weight, biometrics, associated medical conditions and contributing factors with patient. She  would benefit from weight loss therapy via a modified calorie, low-carb, high-protein nutritional plan tailored to their REE (resting energy expenditure) which Kaiser be determined by indirect calorimetry.  We Kaiser also assess for cardiometabolic risk and nutritional derangements via fasting serologies at her next appointment.      Obesity Treatment / Action Plan:  Kaiser complete provided nutritional and psychosocial assessment questionnaire before the next  appointment. Kaiser be scheduled for indirect calorimetry to determine resting energy expenditure in a fasting state.  This Kaiser allow [place] to create a reduced calorie, high-protein meal plan to promote loss of fat mass while preserving muscle mass. Kaiser avoid skipping meals which may result in increased hunger signals and overeating at certain times. Was counseled on nutritional approaches to weight loss and benefits of complex carbs and high quality protein as part of nutritional weight management. Was counseled on pharmacotherapy and role as an adjunct in weight management.   Obesity Education Performed Today:  She was weighed on the bioimpedance scale and results were discussed and documented in the synopsis.  We discussed obesity as a disease and the importance of a more detailed evaluation of all the factors contributing to the disease.  We discussed the importance of long term lifestyle changes which include nutrition, exercise and behavioral modifications as well as the importance of customizing this to her specific health and social needs.  We discussed the benefits of reaching a healthier weight to alleviate the symptoms of existing conditions and reduce the risks of the biomechanical, metabolic and psychological effects of obesity.  Stacey Kaiser Kaiser appears to be in the action stage of change and states they are ready to start intensive lifestyle modifications and behavioral modifications.   Reviewed by clinician on day of visit: allergies, medications, problem list, medical history, surgical history, family history, social history, and previous encounter notes.  I have personally spent 55 minutes total time today in preparation, patient care, and documentation for this visit, including the following: review of clinical lab tests; review of medical tests/procedures/services.   I, Trixie Dredge, am acting as transcriptionist for Dennard Nip, MD   I have reviewed the above  documentation for accuracy and completeness, and I agree with the above. Dennard Nip, MD

## 2023-01-02 LAB — CMP14+EGFR
ALT: 27 IU/L (ref 0–32)
AST: 30 IU/L (ref 0–40)
Albumin/Globulin Ratio: 1.5 (ref 1.2–2.2)
Albumin: 4.1 g/dL (ref 3.8–4.9)
Alkaline Phosphatase: 99 IU/L (ref 44–121)
BUN/Creatinine Ratio: 8 — ABNORMAL LOW (ref 9–23)
BUN: 7 mg/dL (ref 6–24)
Bilirubin Total: 0.3 mg/dL (ref 0.0–1.2)
CO2: 20 mmol/L (ref 20–29)
Calcium: 9.8 mg/dL (ref 8.7–10.2)
Chloride: 101 mmol/L (ref 96–106)
Creatinine, Ser: 0.89 mg/dL (ref 0.57–1.00)
Globulin, Total: 2.7 g/dL (ref 1.5–4.5)
Glucose: 115 mg/dL — ABNORMAL HIGH (ref 70–99)
Potassium: 4.1 mmol/L (ref 3.5–5.2)
Sodium: 140 mmol/L (ref 134–144)
Total Protein: 6.8 g/dL (ref 6.0–8.5)
eGFR: 78 mL/min/{1.73_m2} (ref >59)

## 2023-01-02 LAB — LIPID PANEL WITH LDL/HDL RATIO
Cholesterol, Total: 191 mg/dL (ref 100–199)
HDL: 41 mg/dL (ref >39)
LDL Chol Calc (NIH): 129 mg/dL — ABNORMAL HIGH (ref 0–99)
LDL/HDL Ratio: 3.1 ratio (ref 0.0–3.2)
Triglycerides: 116 mg/dL (ref 0–149)
VLDL Cholesterol Cal: 21 mg/dL (ref 5–40)

## 2023-01-02 LAB — CBC WITH DIFFERENTIAL/PLATELET
Basophils Absolute: 0.1 10*3/uL (ref 0.0–0.2)
Basos: 1 %
EOS (ABSOLUTE): 0.3 10*3/uL (ref 0.0–0.4)
Eos: 5 %
Hematocrit: 41.1 % (ref 34.0–46.6)
Hemoglobin: 13.5 g/dL (ref 11.1–15.9)
Immature Grans (Abs): 0 10*3/uL (ref 0.0–0.1)
Immature Granulocytes: 0 %
Lymphocytes Absolute: 1.4 10*3/uL (ref 0.7–3.1)
Lymphs: 23 %
MCH: 29.9 pg (ref 26.6–33.0)
MCHC: 32.8 g/dL (ref 31.5–35.7)
MCV: 91 fL (ref 79–97)
Monocytes Absolute: 0.3 10*3/uL (ref 0.1–0.9)
Monocytes: 4 %
Neutrophils Absolute: 4.2 10*3/uL (ref 1.4–7.0)
Neutrophils: 67 %
Platelets: 356 10*3/uL (ref 150–450)
RBC: 4.51 x10E6/uL (ref 3.77–5.28)
RDW: 13 % (ref 11.7–15.4)
WBC: 6.3 10*3/uL (ref 3.4–10.8)

## 2023-01-02 LAB — HEMOGLOBIN A1C
Est. average glucose Bld gHb Est-mCnc: 123 mg/dL
Hgb A1c MFr Bld: 5.9 % — ABNORMAL HIGH (ref 4.8–5.6)

## 2023-01-02 LAB — INSULIN, RANDOM: INSULIN: 20.1 u[IU]/mL (ref 2.6–24.9)

## 2023-01-02 LAB — TSH: TSH: 4.19 u[IU]/mL (ref 0.450–4.500)

## 2023-01-02 LAB — VITAMIN D 25 HYDROXY (VIT D DEFICIENCY, FRACTURES): Vit D, 25-Hydroxy: 37.8 ng/mL (ref 30.0–100.0)

## 2023-01-02 LAB — VITAMIN B12: Vitamin B-12: >2000 pg/mL — ABNORMAL HIGH (ref 232–1245)

## 2023-01-03 ENCOUNTER — Encounter (INDEPENDENT_AMBULATORY_CARE_PROVIDER_SITE_OTHER): Payer: Commercial Managed Care - HMO | Admitting: Family Medicine

## 2023-01-08 NOTE — Progress Notes (Unsigned)
Chief Complaint:   OBESITY Stacey Kaiser (MR# [id number]) is a 52 y.o. female who presents for evaluation and treatment of obesity and related comorbidities. Current BMI is Body mass index is 66 kg/m. Stacey Kaiser has been struggling with her weight for many years and has been unsuccessful in either losing weight, maintaining weight loss, or reaching her healthy weight goal.  Stacey Kaiser has gained 50 lbs in the last 2 years. She often skips meals, but she is ready to work on her diet.   Stacey Kaiser is currently in the action stage of change and ready to dedicate time achieving and maintaining a healthier weight. Stacey Kaiser is interested in becoming our patient and working on intensive lifestyle modifications including (but not limited to) diet and exercise for weight loss.  Stacey Kaiser's habits were reviewed today and are as follows: Her family eats meals together, she thinks her family will eat healthier with her, her desired weight loss is 155 lbs, she has been heavy most of her life, she started gaining weight during high school and college, her heaviest weight ever was 304 pounds, she has significant food cravings issues, she skips meals frequently, and she is frequently drinking liquids with calories.  Depression Screen Stacey Kaiser's Food and Mood (modified PHQ-9) score was 16.  Subjective:   1. Other fatigue Stacey Kaiser admits to daytime somnolence and admits to waking up still tired. Patient has a history of symptoms of daytime fatigue, morning fatigue, and morning headache. Stacey Kaiser generally gets 5 hours of sleep per night, and states that she has nightime awakenings. Stacey Kaiser is present. Apneic episodes are present. [name] is 3.   2. SOBOE (shortness of breath on exertion) [name] notes increasing shortness of breath with exercising and seems to be worsening over time with weight gain. She notes getting out of breath sooner with activity than she used to. This has not gotten  worse recently. [name] denies shortness of breath at rest or orthopnea.  3. Hyperglycemia [name] has multiple elevated glucose readings in the past. She has no recent A1c.  4. Vitamin D deficiency [name] has a history of low Vitamin D, and she notes fatigue. She is not on Vitamin D currently.   5. OSA (obstructive sleep apnea) Stacey Kaiser was recently diagnosed with mild obstructive sleep apnea. She cannot afford CPAP and she is ready to work on her weight loss to improve her obstructive sleep apnea.   Assessment/Plan:   1. Other fatigue Stacey Kaiser does feel that her weight is causing her energy to be lower than it should be. Fatigue may be related to obesity, depression or many other causes. Labs will be ordered, and in the meanwhile, Stacey Kaiser will focus on self care including making healthy food choices, increasing physical activity and focusing on stress reduction.  - EKG 12-Lead  2. SOBOE (shortness of breath on exertion) Stacey Kaiser does feel that she gets out of breath more easily that she used to when she exercises. Stacey Kaiser's shortness of breath appears to be obesity related and exercise induced. She has agreed to work on weight loss and gradually increase exercise to treat her exercise induced shortness of breath. Patient's RMR is within normal limits for her age, sex, and height.  We will continue to monitor closely.  3. Hyperglycemia We will check labs today. Stacey Kaiser will start on her Category 2 meal plan.   - Vitamin B12 - CBC with Differential/Platelet - CMP14+EGFR - Lipid Panel With LDL/HDL Ratio - Insulin, random - Hemoglobin A1c -  TSH  4. Vitamin D deficiency We will check labs today, and will follow-up at Meckenzie's next visit.   - VITAMIN D 25 Hydroxy (Vit-D Deficiency, Fractures)  5. OSA (obstructive sleep apnea) Stacey Kaiser will start on her Category 2 meal plan, and we will follow-up at her next visit.   6. Depression screening Stacey Kaiser had a positive depression  screening. Depression is commonly associated with obesity and often results in emotional eating behaviors. We will monitor this closely and work on CBT to help improve the non-hunger eating patterns. Referral to Psychology may be required if no improvement is seen as she continues in our clinic.  7. Obesity, Beginning BMI 66.1  8. Class 3 severe obesity with serious comorbidity and body mass index (BMI) of 60.0 to 69.9 in adult, unspecified obesity type Stacey Kaiser) Stacey Kaiser is currently in the action stage of change and her goal is to continue with weight loss efforts. I recommend Stacey Kaiser begin the structured treatment plan as follows:  She has agreed to the Category 2 Plan.  Patient is to weigh and measure her protein and vegetables.   Exercise goals: No exercise has been prescribed for now, while we concentrate on nutritional changes.   Behavioral modification strategies: increasing lean protein intake, no skipping meals, and meal planning and cooking strategies.  She was informed of the importance of frequent follow-up visits to maximize her success with intensive lifestyle modifications for her multiple health conditions. She was informed we would discuss her lab results at her next visit unless there is a critical issue that needs to be addressed sooner. Stacey Kaiser agreed to keep her next visit at the agreed upon time to discuss these results.  Objective:   Blood pressure 139/77, pulse 79, temperature 97.6 F (36.4 C), height 4\' 9"  (1.448 m), weight (!) 305 lb (138.3 kg), SpO2 93 %. Body mass index is 66 kg/m.  EKG: Normal sinus rhythm, rate 68 BPM.  Indirect Calorimeter completed today shows a VO2 of 285 and a REE of 1973.  Her calculated basal metabolic rate is [id number] thus her basal metabolic rate is better than expected.  General: Cooperative, alert, well developed, in no acute distress. HEENT: Conjunctivae and lids unremarkable. Cardiovascular: Regular rhythm.  Lungs: Normal work of  breathing. Neurologic: No focal deficits.   Lab Results  Component Value Date   CREATININE 0.89 01/01/2023   BUN 7 01/01/2023   NA 140 01/01/2023   K 4.1 01/01/2023   CL 101 01/01/2023   CO2 20 01/01/2023   Lab Results  Component Value Date   ALT 27 01/01/2023   AST 30 01/01/2023   ALKPHOS 99 01/01/2023   BILITOT 0.3 01/01/2023   Lab Results  Component Value Date   HGBA1C 5.9 (H) 01/01/2023   HGBA1C 5.9 (H) 12/16/2021   HGBA1C 5.7 (H) 01/17/2021   Lab Results  Component Value Date   INSULIN 20.1 01/01/2023   Lab Results  Component Value Date   TSH 4.190 01/01/2023   Lab Results  Component Value Date   CHOL 191 01/01/2023   HDL 41 01/01/2023   LDLCALC 129 (H) 01/01/2023   TRIG 116 01/01/2023   Lab Results  Component Value Date   WBC 6.3 01/01/2023   HGB 13.5 01/01/2023   HCT 41.1 01/01/2023   MCV 91 01/01/2023   PLT 356 01/01/2023   No results found for: "IRON", "TIBC", "FERRITIN"  Attestation Statements:   Reviewed by clinician on day of visit: allergies, medications, problem list, medical history, surgical history,  family history, social history, and previous encounter notes.  Time spent on visit including pre-visit chart review and post-visit charting and care was 40 minutes.   I, Trixie Dredge, am acting as transcriptionist for Dennard Nip, MD.  I have reviewed the above documentation for accuracy and completeness, and I agree with the above. - ***

## 2023-01-15 ENCOUNTER — Encounter (INDEPENDENT_AMBULATORY_CARE_PROVIDER_SITE_OTHER): Payer: Self-pay | Admitting: Family Medicine

## 2023-01-15 ENCOUNTER — Ambulatory Visit (INDEPENDENT_AMBULATORY_CARE_PROVIDER_SITE_OTHER): Payer: Commercial Managed Care - HMO | Admitting: Family Medicine

## 2023-01-15 ENCOUNTER — Other Ambulatory Visit: Payer: Commercial Managed Care - HMO

## 2023-01-15 VITALS — BP 116/81 | HR 98 | Temp 97.5°F | Ht <= 58 in | Wt 299.0 lb

## 2023-01-15 DIAGNOSIS — E669 Obesity, unspecified: Secondary | ICD-10-CM

## 2023-01-15 DIAGNOSIS — E7849 Other hyperlipidemia: Secondary | ICD-10-CM

## 2023-01-15 DIAGNOSIS — E559 Vitamin D deficiency, unspecified: Secondary | ICD-10-CM | POA: Diagnosis not present

## 2023-01-15 DIAGNOSIS — R7303 Prediabetes: Secondary | ICD-10-CM

## 2023-01-15 DIAGNOSIS — Z6841 Body Mass Index (BMI) 40.0 and over, adult: Secondary | ICD-10-CM

## 2023-01-15 MED ORDER — VITAMIN D (ERGOCALCIFEROL) 1.25 MG (50000 UNIT) PO CAPS
50000.0000 [IU] | ORAL_CAPSULE | ORAL | 0 refills | Status: DC
Start: 2023-01-15 — End: 2023-02-13

## 2023-01-15 MED ORDER — METFORMIN HCL 500 MG PO TABS: 500.0000 mg | ORAL_TABLET | Freq: Every day | ORAL | 0 refills | Status: DC

## 2023-01-15 NOTE — Progress Notes (Unsigned)
Chief Complaint:   OBESITY Channel is here to discuss her progress with her obesity treatment plan along with follow-up of her obesity related diagnoses. Rickie is on the Category 2 Plan and states she is following her eating plan approximately 50% of the time. Alitzah states she is walking for 10 minutes.  Today's visit was #: 2 Starting weight: 305 lbs Starting date: 01/01/2023 Today's weight: 299 lbs Today's date: 01/15/2023 Total lbs lost to date: 6 Total lbs lost since last in-office visit: 6  Interim History: Sharyn Lull struggled to follow her category 2 plan closely, especially as related to meal preparation with her back, hip, and knee pain.  Subjective:   1. Prediabetes Maricarmen has a elevated glucose, elevated A1c, and elevated insulin.  She struggles with her weight and she has a strong family history of diabetes mellitus.  I discussed labs with the patient today.  2. Other hyperlipidemia Addilyne's LDL is elevated, and she is not on a statin.  She is working on improving with her diet.  I discussed labs with the patient today.  3. Vitamin D deficiency Bellissa is on a OTC multivitamins, and her vitamin D level is still low and she notes fatigue.  I discussed labs with the patient today.  Assessment/Plan:   1. Prediabetes Alaira agreed to start metformin 500 mg every morning with no refills.  She was educated on macronutrients including simple versus complex carbohydrates, and various protein sources, and fat.  - metFORMIN (GLUCOPHAGE) 500 MG tablet; Take 1 tablet (500 mg total) by mouth daily with breakfast.  Dispense: 30 tablet; Refill: 0  2. Other hyperlipidemia Caresse will continue her category 2 plan, and we will recheck labs in 3 months.  3. Vitamin D deficiency Orchid agreed to start prescription vitamin D 50,000 IU once weekly with no refills.  - Vitamin D, Ergocalciferol, (DRISDOL) 1.25 MG (50000 UNIT) CAPS capsule; Take 1 capsule (50,000 Units total) by  mouth every 7 (seven) days.  Dispense: 5 capsule; Refill: 0  4. BMI 60.0-69.9, adult  5. Obesity, Beginning BMI 65.98 Catilin is currently in the action stage of change. As such, her goal is to continue with weight loss efforts. She has agreed to the Category 2 Plan.   Exercise goals: As is.   Behavioral modification strategies: increasing lean protein intake, decreasing simple carbohydrates, and meal planning and cooking strategies.  Mikayli has agreed to follow-up with our clinic in 2 weeks. She was informed of the importance of frequent follow-up visits to maximize her success with intensive lifestyle modifications for her multiple health conditions.   Objective:   Blood pressure 116/81, pulse 98, temperature (!) 97.5 F (36.4 C), height 4\' 9"  (1.448 m), weight 299 lb (135.6 kg), SpO2 94 %. Body mass index is 64.7 kg/m.  Lab Results  Component Value Date   CREATININE 0.89 01/01/2023   BUN 7 01/01/2023   NA 140 01/01/2023   K 4.1 01/01/2023   CL 101 01/01/2023   CO2 20 01/01/2023   Lab Results  Component Value Date   ALT 27 01/01/2023   AST 30 01/01/2023   ALKPHOS 99 01/01/2023   BILITOT 0.3 01/01/2023   Lab Results  Component Value Date   HGBA1C 5.9 (H) 01/01/2023   HGBA1C 5.9 (H) 12/16/2021   HGBA1C 5.7 (H) 01/17/2021   Lab Results  Component Value Date   INSULIN 20.1 01/01/2023   Lab Results  Component Value Date   TSH 4.190 01/01/2023   Lab Results  Component Value Date   CHOL 191 01/01/2023   HDL 41 01/01/2023   LDLCALC 129 (H) 01/01/2023   TRIG 116 01/01/2023   Lab Results  Component Value Date   VD25OH 37.8 01/01/2023   VD25OH 36 03/24/2021   Lab Results  Component Value Date   WBC 6.3 01/01/2023   HGB 13.5 01/01/2023   HCT 41.1 01/01/2023   MCV 91 01/01/2023   PLT 356 01/01/2023   No results found for: "IRON", "TIBC", "FERRITIN"  Attestation Statements:   Reviewed by clinician on day of visit: allergies, medications, problem list,  medical history, surgical history, family history, social history, and previous encounter notes.  Time spent on visit including pre-visit chart review and post-visit care and charting was 40 minutes.   I, Trixie Dredge, am acting as transcriptionist for Dennard Nip, MD.  I have reviewed the above documentation for accuracy and completeness, and I agree with the above. -  Dennard Nip, MD

## 2023-01-22 ENCOUNTER — Other Ambulatory Visit: Payer: Self-pay | Admitting: Critical Care Medicine

## 2023-01-22 DIAGNOSIS — M4644 Discitis, unspecified, thoracic region: Secondary | ICD-10-CM

## 2023-01-22 DIAGNOSIS — R635 Abnormal weight gain: Secondary | ICD-10-CM

## 2023-01-22 DIAGNOSIS — R7989 Other specified abnormal findings of blood chemistry: Secondary | ICD-10-CM

## 2023-01-22 MED ORDER — METHOCARBAMOL 500 MG PO TABS
500.0000 mg | ORAL_TABLET | Freq: Three times a day (TID) | ORAL | 0 refills | Status: DC | PRN
Start: 2023-01-22 — End: 2023-03-12

## 2023-01-22 MED ORDER — SERTRALINE HCL 50 MG PO TABS: 50.0000 mg | ORAL_TABLET | Freq: Every day | ORAL | 0 refills | Status: DC

## 2023-01-22 MED ORDER — FUROSEMIDE 20 MG PO TABS
20.0000 mg | ORAL_TABLET | Freq: Every day | ORAL | 0 refills | Status: DC
Start: 2023-01-22 — End: 2023-03-12

## 2023-01-23 ENCOUNTER — Encounter (HOSPITAL_BASED_OUTPATIENT_CLINIC_OR_DEPARTMENT_OTHER): Payer: Self-pay | Admitting: Cardiology

## 2023-01-23 ENCOUNTER — Other Ambulatory Visit: Payer: Self-pay

## 2023-01-23 MED ORDER — LOSARTAN POTASSIUM 100 MG PO TABS: 100.0000 mg | ORAL_TABLET | Freq: Every day | ORAL | 3 refills | Status: DC

## 2023-01-25 NOTE — Progress Notes (Signed)
TeleHealth Visit:  This visit was completed with telemedicine (audio/video) technology. Stacey Kaiser has verbally consented to this TeleHealth visit. The patient is located at home, the provider is located at home. The participants in this visit include the listed provider and patient. The visit was conducted today via MyChart video.  OBESITY Stacey Kaiser is here to discuss her progress with her obesity treatment plan along with follow-up of her obesity related diagnoses.   Today's visit was # 3 Starting weight: 305 lbs Starting date: 01/01/2023 Weight at last in office visit: 299 lbs on 01/15/23 Total weight loss: 6 lbs at last in office visit on 01/15/23. Today's reported weight (01/29/23): none reported  Nutrition Plan: the Category 2 plan  Current exercise: none  Interim History:  She struggles with meal skipping.  She was eating only one meal per day before starting our program. She is currently eating 2 meals per day and a snack. Eats either breakfast or lunch.   Breakfast is 1 yogurt and 1 cheese stick. Lunch is salad with 4 ounces of meat, cheese, nonstarchy vegetables, low sugar dressing. Consistently eats dinner-protein and a vegetable.  Eating all of the prescribed protein: no Skipping meals: Yes Drinking sugar sweetened beverages: No Hunger controlled: well controlled. Cravings controlled:  moderately controlled. Has cravings with menses.  Assessment/Plan:  1. Prediabetes Last A1c was 5.9. Metformin started last OV.  Has had some loose stools. Lab Results  Component Value Date   HGBA1C 5.9 (H) 01/01/2023   HGBA1C 5.9 (H) 12/16/2021   HGBA1C 5.7 (H) 01/17/2021   Lab Results  Component Value Date   INSULIN 20.1 01/01/2023    Plan: Continue Metformin 500 mg once daily breakfast Consider switching to extended release if diarrhea continues.   2. Vitamin D Deficiency Vitamin D is not at goal of 50.  Most recent vitamin D level was 37.8 on 01/01/2023.Marland Kitchen She is on   prescription ergocalciferol 50,000 IU weekly. Lab Results  Component Value Date   VD25OH 37.8 01/01/2023   VD25OH 36 03/24/2021    Plan: Continue  prescription ergocalciferol 50,000 IU weekly   3. Morbid Obesity: Current BMI 64  Stacey Kaiser is currently in the action stage of change. As such, her goal is to continue with weight loss efforts.  She has agreed to the Category 2 plan.  1.  Lunch options sent via MyChart. 2.  May have 1 Greek yogurt and 2 cheese sticks for breakfast. 3.  Consider buying skinny girl dressing next time she buys dressings.  Exercise goals: No exercise has been prescribed at this time.  Behavioral modification strategies: increasing lean protein intake, meal planning , and planning for success.  Stacey Kaiser has agreed to follow-up with our clinic in 2 weeks.   No orders of the defined types were placed in this encounter.   There are no discontinued medications.   No orders of the defined types were placed in this encounter.     Objective:   VITALS: Per patient if applicable, see vitals. GENERAL: Alert and in no acute distress. CARDIOPULMONARY: No increased WOB. Speaking in clear sentences.  PSYCH: Pleasant and cooperative. Speech normal rate and rhythm. Affect is appropriate. Insight and judgement are appropriate. Attention is focused, linear, and appropriate.  NEURO: Oriented as arrived to appointment on time with no prompting.   Attestation Statements:   Reviewed by clinician on day of visit: allergies, medications, problem list, medical history, surgical history, family history, social history, and previous encounter notes.  This was prepared with the  assistance of Engineer, civil (consulting).  Occasional wrong-word or sound-a-like substitutions may have occurred due to the inherent limitations of voice recognition software.

## 2023-01-29 ENCOUNTER — Encounter (INDEPENDENT_AMBULATORY_CARE_PROVIDER_SITE_OTHER): Payer: Self-pay | Admitting: Family Medicine

## 2023-01-29 ENCOUNTER — Telehealth (INDEPENDENT_AMBULATORY_CARE_PROVIDER_SITE_OTHER): Payer: Commercial Managed Care - HMO | Admitting: Family Medicine

## 2023-01-29 DIAGNOSIS — E559 Vitamin D deficiency, unspecified: Secondary | ICD-10-CM | POA: Diagnosis not present

## 2023-01-29 DIAGNOSIS — R7303 Prediabetes: Secondary | ICD-10-CM

## 2023-01-29 DIAGNOSIS — Z6841 Body Mass Index (BMI) 40.0 and over, adult: Secondary | ICD-10-CM

## 2023-02-13 ENCOUNTER — Ambulatory Visit (INDEPENDENT_AMBULATORY_CARE_PROVIDER_SITE_OTHER): Payer: Commercial Managed Care - HMO | Admitting: Family Medicine

## 2023-02-13 ENCOUNTER — Encounter (INDEPENDENT_AMBULATORY_CARE_PROVIDER_SITE_OTHER): Payer: Self-pay | Admitting: Family Medicine

## 2023-02-13 VITALS — BP 137/78 | HR 79 | Temp 97.9°F | Ht <= 58 in | Wt 292.0 lb

## 2023-02-13 DIAGNOSIS — E669 Obesity, unspecified: Secondary | ICD-10-CM | POA: Diagnosis not present

## 2023-02-13 DIAGNOSIS — R7303 Prediabetes: Secondary | ICD-10-CM

## 2023-02-13 DIAGNOSIS — Z6841 Body Mass Index (BMI) 40.0 and over, adult: Secondary | ICD-10-CM | POA: Diagnosis not present

## 2023-02-13 DIAGNOSIS — E559 Vitamin D deficiency, unspecified: Secondary | ICD-10-CM

## 2023-02-13 MED ORDER — VITAMIN D (ERGOCALCIFEROL) 1.25 MG (50000 UNIT) PO CAPS: 50000.0000 [IU] | ORAL_CAPSULE | ORAL | 0 refills | Status: DC

## 2023-02-13 MED ORDER — METFORMIN HCL 500 MG PO TABS
500.0000 mg | ORAL_TABLET | Freq: Every day | ORAL | 0 refills | Status: DC
Start: 2023-02-13 — End: 2023-02-27

## 2023-02-14 NOTE — Progress Notes (Unsigned)
Chief Complaint:   OBESITY Stacey Kaiser is here to discuss her progress with her obesity treatment plan along with follow-up of her obesity related diagnoses. Stacey Kaiser is on the Category 2 Plan and states she is following her eating plan approximately 50% of the time. Stacey Kaiser states she is walking for 10 minutes 7 times per week.  Today's visit was #: 4 Starting weight: 305 lbs Starting date: 01/01/2023 Today's weight: 292 lbs Today's date: 02/13/2023 Total lbs lost to date: 13 Total lbs lost since last in-office visit: 7  Interim History: Stacey Kaiser continues to do well with her weight loss. Her hunger is controlled and she has some questions about nuts, pasta, peanut butter, and sushi.   Subjective:   1. Prediabetes Stacey Kaiser is doing well on metformin. She had some initial GI upset but this has resolved.   2. Vitamin D deficiency Stacey Kaiser on Vitamin D prescription, but her level is not yet at goal.   Assessment/Plan:   1. Prediabetes Stacey Kaiser will continue with her diet and metformin, and we will refill metformin for 1 month.   - metFORMIN (GLUCOPHAGE) 500 MG tablet; Take 1 tablet (500 mg total) by mouth daily with breakfast.  Dispense: 30 tablet; Refill: 0  2. Vitamin D deficiency Stacey Kaiser will continue prescription Vitamin D, and we will refill for 1 month.   - Vitamin D, Ergocalciferol, (DRISDOL) 1.25 MG (50000 UNIT) CAPS capsule; Take 1 capsule (50,000 Units total) by mouth every 7 (seven) days.  Dispense: 5 capsule; Refill: 0  3. BMI 60.0-69.9, adult (HCC)  4. Obesity, Beginning BMI 66.1 Stacey Kaiser is currently in the action stage of change. As such, her goal is to continue with weight loss efforts. She has agreed to the Category 2 Plan.   Exercise goals: As is.   Behavioral modification strategies: increasing lean protein intake and no skipping meals.  Stacey Kaiser has agreed to follow-up with our clinic in 2 weeks. She was informed of the importance of frequent follow-up visits  to maximize her success with intensive lifestyle modifications for her multiple health conditions.   Objective:   Blood pressure 137/78, pulse 79, temperature 97.9 F (36.6 C), height 4\' 9"  (1.448 m), weight 292 lb (132.5 kg), SpO2 94 %. Body mass index is 63.19 kg/m.  Lab Results  Component Value Date   CREATININE 0.89 01/01/2023   BUN 7 01/01/2023   NA 140 01/01/2023   K 4.1 01/01/2023   CL 101 01/01/2023   CO2 20 01/01/2023   Lab Results  Component Value Date   ALT 27 01/01/2023   AST 30 01/01/2023   ALKPHOS 99 01/01/2023   BILITOT 0.3 01/01/2023   Lab Results  Component Value Date   HGBA1C 5.9 (H) 01/01/2023   HGBA1C 5.9 (H) 12/16/2021   HGBA1C 5.7 (H) 01/17/2021   Lab Results  Component Value Date   INSULIN 20.1 01/01/2023   Lab Results  Component Value Date   TSH 4.190 01/01/2023   Lab Results  Component Value Date   CHOL 191 01/01/2023   HDL 41 01/01/2023   LDLCALC 129 (H) 01/01/2023   TRIG 116 01/01/2023   Lab Results  Component Value Date   VD25OH 37.8 01/01/2023   VD25OH 36 03/24/2021   Lab Results  Component Value Date   WBC 6.3 01/01/2023   HGB 13.5 01/01/2023   HCT 41.1 01/01/2023   MCV 91 01/01/2023   PLT 356 01/01/2023   No results found for: "IRON", "TIBC", "FERRITIN"  Attestation Statements:  Reviewed by clinician on day of visit: allergies, medications, problem list, medical history, surgical history, family history, social history, and previous encounter notes.   I, Burt Knack, am acting as transcriptionist for  Quillian Quince, MD.  I have reviewed the above documentation for accuracy and completeness, and I agree with the above. -  Quillian Quince, MD

## 2023-02-18 ENCOUNTER — Encounter: Payer: Self-pay | Admitting: Critical Care Medicine

## 2023-02-20 ENCOUNTER — Ambulatory Visit
Admission: RE | Admit: 2023-02-20 | Discharge: 2023-02-20 | Disposition: A | Discharge status: Home / Self Care | Payer: Commercial Managed Care - HMO | Source: Ambulatory Visit | Attending: Critical Care Medicine | Admitting: Critical Care Medicine

## 2023-02-20 DIAGNOSIS — N6310 Unspecified lump in the right breast, unspecified quadrant: Secondary | ICD-10-CM

## 2023-02-20 NOTE — Progress Notes (Signed)
Let the patient know I have reviewed her mammogram and ultrasound findings and she has no malignancy she has a very benign cluster of cysts in the right breast at 11:00 and they have decreased since prior exam in March 2023 and remind her she will get another mammogram in 1 year

## 2023-02-21 ENCOUNTER — Telehealth: Payer: Self-pay

## 2023-02-21 NOTE — Telephone Encounter (Signed)
-----   Message from Storm Frisk, MD sent at 02/20/2023  1:05 PM EDT ----- Let the patient know I have reviewed her mammogram and ultrasound findings and she has no malignancy she has a very benign cluster of cysts in the right breast at 11:00 and they have decreased since prior exam in March 2023 and remind her she will get another mammogram in 1 year

## 2023-02-21 NOTE — Telephone Encounter (Signed)
Pt was called and is aware of results, DOB was confirmed.  ?

## 2023-02-26 NOTE — Progress Notes (Unsigned)
TeleHealth Visit:  This visit was completed with telemedicine (audio/video) technology. Stacey Kaiser has verbally consented to this TeleHealth visit. The patient is located at home, the provider is located at home. The participants in this visit include the listed provider and patient. The visit was conducted today via MyChart video.  OBESITY Stacey Kaiser is here to discuss her progress with her obesity treatment plan along with follow-up of her obesity related diagnoses.   Today's visit was # 5 Starting weight: 305 lbs Starting date: 01/01/2023 Weight at last in office visit: 292 lbs on 01/01/23 Total weight loss: 13 lbs at last in office visit on 01/01/23. Today's reported weight (02/27/23): none reported  Nutrition Plan: the Category 2 plan   Current exercise: gets up every 1 to 1.5 hours to clean house/walk  Interim History:  She finds it hard to get in all of the food. She misses lunch sometimes. She is drinking plenty of water. Other than skipping lunch she is adhering to meal plan very well. 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  She is seeing Dr. Lovell Sheehan this week for evaluation for surgery on T7-9.  Eating all of the prescribed protein: no Skipping meals: No Drinking adequate water: No Drinking sugar sweetened beverages: Yes Hunger controlled: well controlled. Cravings controlled:  well controlled.  Assessment/Plan:  1. Prediabetes Last A1c was 5.9.  Medication(s): Metformin 500 mg once daily breakfast.  Denies side effects.  She always has bowel issues but these are not worse with the metformin. Denies polyphagia. Lab Results  Component Value Date   HGBA1C 5.9 (H) 01/01/2023   HGBA1C 5.9 (H) 12/16/2021   HGBA1C 5.7 (H) 01/17/2021   Lab Results  Component Value Date   INSULIN 20.1 01/01/2023    Plan: Continue and refill Metformin 500 mg once daily breakfast   2.  Primary hypertension Hypertension well controlled.  Medication(s): Lasix  20 mg daily, losartan 100 mg daily.  BP Readings from Last 3 Encounters:  02/13/23 137/78  01/15/23 116/81  01/01/23 139/77   Lab Results  Component Value Date   CREATININE 0.89 01/01/2023   CREATININE 0.95 06/06/2022   CREATININE 0.81 12/16/2021   No results found for: "GFR"  Plan: Continue all antihypertensives at current dosages.   3. Morbid Obesity: Current BMI 63  Terrye is currently in the action stage of change. As such, her goal is to continue with weight loss efforts.  She has agreed to the Category 2 plan. 1.  Breakfast options sent via MyChart. 2.  She will set alarm to remind her to eat lunch.  Exercise goals:  as is  Behavioral modification strategies: increasing lean protein intake, no meal skipping, and planning for success.  Kyala has agreed to follow-up with our clinic in 3 weeks.   No orders of the defined types were placed in this encounter.   Medications Discontinued During This Encounter  Medication Reason   metFORMIN (GLUCOPHAGE) 500 MG tablet Reorder     Meds ordered this encounter  Medications   metFORMIN (GLUCOPHAGE) 500 MG tablet    Sig: Take 1 tablet (500 mg total) by mouth daily with breakfast.    Dispense:  30 tablet    Refill:  0    Order Specific Question:   Supervising Provider    Answer:   Glennis Brink [[id number]]      Objective:   VITALS: Per patient if applicable, see vitals. GENERAL: Alert and in no acute distress. CARDIOPULMONARY: No increased WOB. Speaking in clear  sentences.  PSYCH: Pleasant and cooperative. Speech normal rate and rhythm. Affect is appropriate. Insight and judgement are appropriate. Attention is focused, linear, and appropriate.  NEURO: Oriented as arrived to appointment on time with no prompting.   Attestation Statements:   Reviewed by clinician on day of visit: allergies, medications, problem list, medical history, surgical history, family history, social history, and previous encounter  notes.  This was prepared with the assistance of Engineer, civil (consulting).  Occasional wrong-word or sound-a-like substitutions may have occurred due to the inherent limitations of voice recognition software.

## 2023-02-27 ENCOUNTER — Encounter (INDEPENDENT_AMBULATORY_CARE_PROVIDER_SITE_OTHER): Payer: Self-pay | Admitting: Family Medicine

## 2023-02-27 ENCOUNTER — Telehealth (INDEPENDENT_AMBULATORY_CARE_PROVIDER_SITE_OTHER): Payer: Commercial Managed Care - HMO | Admitting: Family Medicine

## 2023-02-27 DIAGNOSIS — I10 Essential (primary) hypertension: Secondary | ICD-10-CM

## 2023-02-27 DIAGNOSIS — Z6841 Body Mass Index (BMI) 40.0 and over, adult: Secondary | ICD-10-CM

## 2023-02-27 DIAGNOSIS — R7303 Prediabetes: Secondary | ICD-10-CM | POA: Diagnosis not present

## 2023-02-27 MED ORDER — METFORMIN HCL 500 MG PO TABS
500.0000 mg | ORAL_TABLET | Freq: Every day | ORAL | 0 refills | Status: DC
Start: 2023-02-27 — End: 2023-03-12

## 2023-02-28 ENCOUNTER — Encounter: Payer: Self-pay | Admitting: Critical Care Medicine

## 2023-02-28 ENCOUNTER — Ambulatory Visit: Payer: Commercial Managed Care - HMO | Attending: Critical Care Medicine | Admitting: Critical Care Medicine

## 2023-02-28 VITALS — BP 119/76 | HR 70 | Temp 98.1°F | Ht <= 58 in | Wt 295.0 lb

## 2023-02-28 DIAGNOSIS — M4624 Osteomyelitis of vertebra, thoracic region: Secondary | ICD-10-CM

## 2023-02-28 DIAGNOSIS — F4321 Adjustment disorder with depressed mood: Secondary | ICD-10-CM | POA: Diagnosis not present

## 2023-02-28 DIAGNOSIS — Z1211 Encounter for screening for malignant neoplasm of colon: Secondary | ICD-10-CM | POA: Diagnosis not present

## 2023-02-28 DIAGNOSIS — I10 Essential (primary) hypertension: Secondary | ICD-10-CM | POA: Diagnosis not present

## 2023-02-28 NOTE — Assessment & Plan Note (Signed)
Improved will monitor off sertraline

## 2023-02-28 NOTE — Patient Instructions (Signed)
Stop sertraline  All other medications unchanged and you have refills  Keep your neurosurgery appointment  Pick up a colon cancer screening kit no other labs needed  Return to Dr. Delford Field 4 months

## 2023-02-28 NOTE — Progress Notes (Signed)
Established Patient Office Visit  Subjective:  Patient ID: Stacey Kaiser, female    DOB: 1971-09-20  Age: 52 y.o. MRN: [id number] CC:  Back pain follow-up  HPI Stacey Kaiser presents for primary care follow-up.  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  Patient is yet to achieve an appointment with neurosurgery she has to pay off what bills she goes from her previous hospital consultations before they will see her.  Patient is going to receive a sleep study per cardiology.  She was started losartan at the last cardiology visit and potassium was stopped.  She has a Technical brewer.  She has a referral to cardiology pharmacy clinic for consideration of Ozempic to help with weight loss.  On arrival blood pressure 128/82.  Patient states her level of dyspnea is improved since she has been on the losartan.  She still is not sleeping well at this time.  Patient only eats 1 meal a day and does not follow a healthy diet.  She did bring in her colon cancer screening kit at this visit.  She has a mammogram scheduled March 2.  She cannot tolerate to lay flat to get a Pap smear we will have to put this off for now.  Body weight continues to be at a mass index of 63 point  6/28 Patient seen in return follow-up and she suffered an anniversary grief reaction her mother died 5 years ago and she had a cat passed away on her mother's date of death this is triggered a grief reaction its been quite severe.  Patient's had failing health as well and not able to work and this is also contributed to her depression.  She was quite tearful today on the interview.  She was seen by cardiology recently in March with elevated blood pressure readings we had been treating her with 50 mg losartan dose was increased to 100 mg  losartan and she is noted improvement with home readings and today on arrival blood pressure 129/86  Patient begin the lifestyle medicine handout at the last visit she is trying to follow a healthier diet has trouble doing any kind of exercise because of severe back pain.  Patient uses gabapentin and nonsteroidals and Robaxin for back pain.  She is off all antibiotics per infectious disease for discitis and has follow-up appoint with neurosurgery in August.  Patient's colon cancer screening was negative but her mammogram showed breast cysts and fibrocystic disease ultrasound and tomographic images of the breast did not show malignancy she will have repeat imaging in 6 months  Patient would like a handicap sticker at this visit  Cardiology wrote for wegovy but her insurance would not cover this form of Ozempic.  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  This required heavy lifting of boxes up to 50 pounds and standing for 8 to 12 hours a day per shift.  She has severe discitis of the lower back and can no longer perform these activities and has been out of work since April 2022.  She needs an extension on her FMLA paperwork and she is in the process of applying for full disability  long-term she has an upcoming appoint with neurosurgery next month.  She has received medical therapy for discitis and neurosurgery wanted her to wait till antibiotic for course of therapy was completed which it was the spring.  She has pain in the lower back she has very poor balance she has to use a walker to ambulate she is short of breath with exertion she is morbidly obese she is despondent over her change in health status.  He was seen by cardiology a week ago had a blood pressure at that time of 133/80 she maintains the losartan for blood pressure  patient is also maintaining furosemide daily albuterol inhaler if needed and sertraline which is helped her depression  02/28/23 Patient is seen in return follow-up for thoracic discitis and obesity.  On arrival blood pressure is good 119/76.  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  Her mental status is improved and she would like to get off sertraline.  She is now in the weight management clinic.  She is yet to get in with neurosurgery.  She does need colon cancer screening. Past Medical History:  Diagnosis Date   Back pain    Depression    Diskitis 01/17/2021   Elevated serum creatinine 03/24/2021   Family history of adverse reaction to anesthesia    " my mother had a seizure coming out of cabg surgery "   Hypertension    Joint pain    Morbid obesity (HCC)    Morbid obesity with BMI of 50.0-59.9, adult (HCC) 01/17/2021   Pre-diabetes    Scoliosis    Sleep apnea    SOB (shortness of breath)    Swelling of both lower extremities     Past Surgical History:  Procedure Laterality Date   IR FLUORO GUIDED NEEDLE PLC ASPIRATION/INJECTION LOC  01/18/2021   RADIOLOGY WITH ANESTHESIA N/A 01/18/2021   Procedure: IR WITH ANESTHESIA;  Surgeon: Radiologist, Medication, MD;  Location: [facility] OR;  Service: Radiology;  Laterality: N/A;   WISDOM TOOTH EXTRACTION      Family History  Problem Relation Age of Onset   Heart failure Mother    Chronic Renal Failure Mother    Diabetes Mother    Breast cancer Cousin     Social History   Socioeconomic History   Marital status: Media planner    Spouse name: Not on file   Number of children: 0   Years of education: Not on file   Highest education level: Master's degree (e.g., MA, MS, MEng, MEd, MSW, MBA)  Occupational History   Occupation: retail at [employer]  Tobacco Use   Smoking status: Never   Smokeless tobacco: Never  Vaping Use   Vaping Use: Never used  Substance and Sexual Activity    Alcohol use: Not Currently    Comment: rare   Drug use: No   Sexual activity: Not on file  Other Topics Concern   Not on file  Social History Narrative   Not on file   Social Determinants of Health   Financial Resource Strain: Medium Risk (02/27/2023)   Overall Financial Resource Strain (CARDIA)    Difficulty of Paying Living Expenses: Somewhat hard  Food Insecurity: Food Insecurity Present (02/27/2023)   Hunger Vital Sign    Worried About Running Out of Food in the Last Year: Sometimes true    Ran Out of Food in the Last Year: Never true  Transportation Needs: No Transportation Needs (  02/27/2023)   PRAPARE - Administrator, Civil Service (Medical): No    Lack of Transportation (Non-Medical): No  Physical Activity: Unknown (02/27/2023)   Exercise Vital Sign    Days of Exercise per Week: 0 days    Minutes of Exercise per Session: Not on file  Stress: Stress Concern Present (02/27/2023)   [employer] of Occupational Health - Occupational Stress Questionnaire    Feeling of Stress : To some extent  Social Connections: Moderately Isolated (02/27/2023)   Social Connection and Isolation Panel [NHANES]    Frequency of Communication with Friends and Family: Once a week    Frequency of Social Gatherings with Friends and Family: Never    Attends Religious Services: Never    Database administrator or Organizations: Yes    Attends Engineer, structural: More than 4 times per year    Marital Status: Living with partner  Intimate Partner Violence: Not on file    Outpatient Medications Prior to Visit  Medication Sig Dispense Refill   albuterol (VENTOLIN HFA) 108 (90 Base) MCG/ACT inhaler Inhale 2 puffs into the lungs every 4 (four) hours as needed for wheezing or shortness of breath. 6.7 g 2   furosemide (LASIX) 20 MG tablet Take 1 tablet (20 mg total) by mouth daily. 30 tablet 0   gabapentin (NEURONTIN) 300 MG capsule Take 2 capsules (600 mg total) by mouth 2 (two)  times daily. 360 capsule 0   Glucosamine-Chondroit-Vit C-Mn (GLUCOSAMINE 1500 COMPLEX) CAPS Take 1 capsule by mouth daily. 90 capsule 2   ibuprofen (ADVIL) 200 MG tablet Take 200 mg by mouth every 6 (six) hours as needed.     losartan (COZAAR) 100 MG tablet Take 1 tablet (100 mg total) by mouth daily. 90 tablet 3   metFORMIN (GLUCOPHAGE) 500 MG tablet Take 1 tablet (500 mg total) by mouth daily with breakfast. 30 tablet 0   methocarbamol (ROBAXIN) 500 MG tablet Take 1 tablet (500 mg total) by mouth every 8 (eight) hours as needed for muscle spasms. 60 tablet 0   Multiple Vitamins-Minerals (ONE-A-DAY WOMENS PETITES PO) Take 1 tablet by mouth daily.     Vitamin D, Ergocalciferol, (DRISDOL) 1.25 MG (50000 UNIT) CAPS capsule Take 1 capsule (50,000 Units total) by mouth every 7 (seven) days. 5 capsule 0   sertraline (ZOLOFT) 50 MG tablet Take 1 tablet (50 mg total) by mouth daily. 30 tablet 0   No facility-administered medications prior to visit.    Allergies  Allergen Reactions   Okra Hives   Other Swelling    Bees and wasps    ROS Review of Systems  Constitutional:  Positive for unexpected weight change. Negative for fatigue.  HENT: Negative.  Negative for ear pain, postnasal drip, rhinorrhea, sinus pressure, sore throat, trouble swallowing and voice change.   Eyes: Negative.   Respiratory:  Negative for apnea, cough, choking, chest tightness, shortness of breath, wheezing and stridor.   Cardiovascular: Negative.  Negative for chest pain, palpitations and leg swelling.  Gastrointestinal: Negative.  Negative for abdominal distention, abdominal pain, nausea and vomiting.  Genitourinary: Negative.   Musculoskeletal:  Positive for back pain. Negative for arthralgias and myalgias.  Skin: Negative.  Negative for rash.  Allergic/Immunologic: Negative.  Negative for environmental allergies and food allergies.  Neurological: Negative.  Negative for dizziness, syncope, weakness and headaches.   Hematological: Negative.  Negative for adenopathy. Does not bruise/bleed easily.  Psychiatric/Behavioral:  Positive for sleep disturbance. Negative for agitation and suicidal  ideas. The patient is not nervous/anxious.       Objective:    Vitals:   02/28/23 1101  BP: 119/76  Pulse: 70  Temp: 98.1 F (36.7 C)  TempSrc: Oral  SpO2: 96%  Weight: 295 lb (133.8 kg)  Height: 4\' 9"  (1.448 m)    Gen: Pleasant, morbidly obese, in no distress,  normal affect  ENT: No lesions,  mouth clear,  oropharynx clear, no postnasal drip  Neck: No JVD, no TMG, no carotid bruits  Lungs: No use of accessory muscles, no dullness to percussion, clear without rales or rhonchi  Cardiovascular: RRR, heart sounds normal, no murmur or gallops, no peripheral edema  Abdomen: soft and NT, no HSM,  BS normal  Musculoskeletal: No deformities, no cyanosis or clubbing, tenderness mid thoracic spine  Neuro: alert, non focal  Skin: Warm, no lesions or rashes  No results found.  Wt Readings from Last 3 Encounters:  02/28/23 295 lb (133.8 kg)  02/13/23 292 lb (132.5 kg)  01/15/23 299 lb (135.6 kg)     Health Maintenance Due  Topic Date Due   PAP SMEAR-Modifier  Never done   Zoster Vaccines- Shingrix (1 of 2) Never done   COVID-19 Vaccine (5 - 2023-24 season) 10/21/2022   COLON CANCER SCREENING ANNUAL FOBT  12/13/2022    There are no preventive care reminders to display for this patient.  Lab Results  Component Value Date   TSH 4.190 01/01/2023   Lab Results  Component Value Date   WBC 6.3 01/01/2023   HGB 13.5 01/01/2023   HCT 41.1 01/01/2023   MCV 91 01/01/2023   PLT 356 01/01/2023   Lab Results  Component Value Date   NA 140 01/01/2023   K 4.1 01/01/2023   CO2 20 01/01/2023   GLUCOSE 115 (H) 01/01/2023   BUN 7 01/01/2023   CREATININE 0.89 01/01/2023   BILITOT 0.3 01/01/2023   ALKPHOS 99 01/01/2023   AST 30 01/01/2023   ALT 27 01/01/2023   PROT 6.8 01/01/2023   ALBUMIN 4.1  01/01/2023   CALCIUM 9.8 01/01/2023   ANIONGAP 10 02/28/2021   EGFR 78 01/01/2023   Lab Results  Component Value Date   CHOL 191 01/01/2023   Lab Results  Component Value Date   HDL 41 01/01/2023   Lab Results  Component Value Date   LDLCALC 129 (H) 01/01/2023   Lab Results  Component Value Date   TRIG 116 01/01/2023   No results found for: "CHOLHDL" Lab Results  Component Value Date   HGBA1C 5.9 (H) 01/01/2023      Assessment & Plan:   I personally reviewed all images and lab data in the [facility] system as well as any outside material available during this office visit and agree with the  radiology impressions.   Hypertension Hypertension controlled no change in medication  Osteomyelitis of thoracic vertebra (HCC) Difficulty getting appointment with neurosurgery I called today and she now has an appointment  Situational depression Improved will monitor off sertraline   Madaline was seen today for medication refill.  Diagnoses and all orders for this visit:  Colon cancer screening -     Fecal occult blood, imunochemical  Primary hypertension  Osteomyelitis of thoracic vertebra (HCC)  Situational depression   Return in about 4 months (around 07/01/2023) for primary care follow up, chronic conditions. Colon cancer kit issued

## 2023-02-28 NOTE — Assessment & Plan Note (Signed)
Hypertension controlled no change in medication

## 2023-02-28 NOTE — Assessment & Plan Note (Signed)
Difficulty getting appointment with neurosurgery I called today and she now has an appointment

## 2023-03-12 ENCOUNTER — Encounter: Payer: Self-pay | Admitting: Critical Care Medicine

## 2023-03-12 DIAGNOSIS — M4644 Discitis, unspecified, thoracic region: Secondary | ICD-10-CM

## 2023-03-12 DIAGNOSIS — E559 Vitamin D deficiency, unspecified: Secondary | ICD-10-CM

## 2023-03-12 DIAGNOSIS — R7989 Other specified abnormal findings of blood chemistry: Secondary | ICD-10-CM

## 2023-03-12 DIAGNOSIS — R7303 Prediabetes: Secondary | ICD-10-CM

## 2023-03-12 DIAGNOSIS — R635 Abnormal weight gain: Secondary | ICD-10-CM

## 2023-03-12 MED ORDER — FUROSEMIDE 20 MG PO TABS: 20.0000 mg | ORAL_TABLET | Freq: Every day | ORAL | 2 refills | Status: DC

## 2023-03-12 MED ORDER — METHOCARBAMOL 500 MG PO TABS
500.0000 mg | ORAL_TABLET | Freq: Three times a day (TID) | ORAL | 2 refills | Status: DC | PRN
Start: 2023-03-12 — End: 2023-05-25

## 2023-03-12 MED ORDER — GABAPENTIN 300 MG PO CAPS: 600.0000 mg | ORAL_CAPSULE | Freq: Two times a day (BID) | ORAL | 0 refills | Status: DC

## 2023-03-12 MED ORDER — VITAMIN D (ERGOCALCIFEROL) 1.25 MG (50000 UNIT) PO CAPS: 50000.0000 [IU] | ORAL_CAPSULE | ORAL | 1 refills | Status: DC

## 2023-03-12 MED ORDER — METFORMIN HCL 500 MG PO TABS
500.0000 mg | ORAL_TABLET | Freq: Every day | ORAL | 1 refills | Status: DC
Start: 2023-03-12 — End: 2023-05-08

## 2023-03-12 MED ORDER — LOSARTAN POTASSIUM 100 MG PO TABS: 100.0000 mg | ORAL_TABLET | Freq: Every day | ORAL | 3 refills | Status: DC

## 2023-03-16 ENCOUNTER — Encounter: Payer: Self-pay | Admitting: Critical Care Medicine

## 2023-03-18 NOTE — Telephone Encounter (Signed)
Carly pls find this paperwork and put on my desk

## 2023-03-20 ENCOUNTER — Ambulatory Visit (INDEPENDENT_AMBULATORY_CARE_PROVIDER_SITE_OTHER): Payer: Commercial Managed Care - HMO | Admitting: Family Medicine

## 2023-03-20 ENCOUNTER — Encounter (INDEPENDENT_AMBULATORY_CARE_PROVIDER_SITE_OTHER): Payer: Self-pay | Admitting: Family Medicine

## 2023-03-20 VITALS — BP 129/77 | HR 72 | Temp 98.4°F | Ht <= 58 in | Wt 291.0 lb

## 2023-03-20 DIAGNOSIS — F3289 Other specified depressive episodes: Secondary | ICD-10-CM | POA: Diagnosis not present

## 2023-03-20 DIAGNOSIS — Z6841 Body Mass Index (BMI) 40.0 and over, adult: Secondary | ICD-10-CM | POA: Diagnosis not present

## 2023-03-20 DIAGNOSIS — E669 Obesity, unspecified: Secondary | ICD-10-CM

## 2023-03-20 DIAGNOSIS — F32A Depression, unspecified: Secondary | ICD-10-CM | POA: Insufficient documentation

## 2023-03-20 DIAGNOSIS — R252 Cramp and spasm: Secondary | ICD-10-CM | POA: Insufficient documentation

## 2023-03-20 NOTE — Progress Notes (Unsigned)
Chief Complaint:   OBESITY Stacey Kaiser is here to discuss her progress with her obesity treatment plan along with follow-up of her obesity related diagnoses. Stacey Kaiser is on the Category 2 Plan and states she is following her eating plan approximately 60% of the time. Stacey Kaiser states she is walking for 10 minutes 7 times per week.  Today's visit was #: 6 Starting weight: 305 lbs Starting date: 01/01/2023 Today's weight: 291 lbs Today's date: 03/20/2023 Total lbs lost to date: 14 Total lbs lost since last in-office visit: 1  Interim History: Patient has done well with continued weight loss.  She has decreased snacking, but she sometimes misses meals especially dinner.  Subjective:   1. Left Leg cramp Patient had a severe left leg cramp that lasted approximately 10 minutes 3 times in the last 6 weeks.  2. Other depression with emotional eating Patient stopped sertraline for 3 weeks.  She feels she is doing well off it and she has not increased emotional eating behavior.  She denies change in sleep or irritability.  Assessment/Plan:   1. Left Leg cramp Patient is to increase her water intake and electrolytes, and we will continue to follow.  2. Other depression with emotional eating Patient is to be mindful of increased comfort eating off sertraline, and we will continue to follow closely.  3. BMI 60.0-69.9, adult (HCC)  4. Obesity, Beginning BMI 66.1 Stacey Kaiser is currently in the action stage of change. As such, her goal is to continue with weight loss efforts. She has agreed to the Category 2 Plan.   Exercise goals: As is.   Behavioral modification strategies: no skipping meals.  Stacey Kaiser has agreed to follow-up with our clinic in 4 weeks. She was informed of the importance of frequent follow-up visits to maximize her success with intensive lifestyle modifications for her multiple health conditions.   Objective:   Blood pressure 129/77, pulse 72, temperature 98.4 F (36.9 C),  height 4\' 9"  (1.448 m), weight 291 lb (132 kg), SpO2 97 %. Body mass index is 62.97 kg/m.  Lab Results  Component Value Date   CREATININE 0.89 01/01/2023   BUN 7 01/01/2023   NA 140 01/01/2023   K 4.1 01/01/2023   CL 101 01/01/2023   CO2 20 01/01/2023   Lab Results  Component Value Date   ALT 27 01/01/2023   AST 30 01/01/2023   ALKPHOS 99 01/01/2023   BILITOT 0.3 01/01/2023   Lab Results  Component Value Date   HGBA1C 5.9 (H) 01/01/2023   HGBA1C 5.9 (H) 12/16/2021   HGBA1C 5.7 (H) 01/17/2021   Lab Results  Component Value Date   INSULIN 20.1 01/01/2023   Lab Results  Component Value Date   TSH 4.190 01/01/2023   Lab Results  Component Value Date   CHOL 191 01/01/2023   HDL 41 01/01/2023   LDLCALC 129 (H) 01/01/2023   TRIG 116 01/01/2023   Lab Results  Component Value Date   VD25OH 37.8 01/01/2023   VD25OH 36 03/24/2021   Lab Results  Component Value Date   WBC 6.3 01/01/2023   HGB 13.5 01/01/2023   HCT 41.1 01/01/2023   MCV 91 01/01/2023   PLT 356 01/01/2023   No results found for: "IRON", "TIBC", "FERRITIN"  Attestation Statements:   Reviewed by clinician on day of visit: allergies, medications, problem list, medical history, surgical history, family history, social history, and previous encounter notes.  Time spent on visit including pre-visit chart review and post-visit care  and charting was 30 minutes.   I, Burt Knack, am acting as transcriptionist for Quillian Quince, MD.  I have reviewed the above documentation for accuracy and completeness, and I agree with the above. -  Quillian Quince, MD

## 2023-03-21 ENCOUNTER — Telehealth: Payer: Self-pay

## 2023-03-21 NOTE — Telephone Encounter (Signed)
Called patient letting her know that I have not received paperwork and she stated that she will call

## 2023-03-29 NOTE — Progress Notes (Signed)
TeleHealth Visit:  This visit was completed with telemedicine (audio/video) technology. Stacey Kaiser has verbally consented to this TeleHealth visit. The patient is located at home, the provider is located at home. The participants in this visit include the listed provider and patient. The visit was conducted today via MyChart video.  OBESITY Stacey Kaiser is here to discuss her progress with her obesity treatment plan along with follow-up of her obesity related diagnoses.   Today's visit was # 5 Starting weight: 305 lbs Starting date: 01/01/23 Weight at last in office visit: 291 lbs on 03/20/23 Total weight loss: 14 lbs at last in office visit on 03/20/23. Today's reported weight (04/09/23): none reported  Nutrition Plan: the Category 2 plan - 50-60% adherence.  Current exercise:  walking for 10 minutes 7 times per week. PT once weekly.  Interim History:  She finds it hard to stand for long enough periods to prep food. She is in chronic pain from back issues and starting PT once weekly (aqua therapy). Insurance will not cover MRI but she likely needs surgery. Sees Dr, Lovell Sheehan.  Has history of osteomyelitis in her thoracic vertebrae. She reports that her chronic pain affects her motivation to stick to meal plan. Having protein shake in am.  Drinking sugar sweetened beverages: No Hunger controlled: well controlled. Cravings controlled:  well controlled.  Assessment/Plan:  1. Prediabetes Last A1c was 5.9.  Medication(s): Metformin 500 mg once daily breakfast. Denies side effects. Polyphagia:No Lab Results  Component Value Date   HGBA1C 5.9 (H) 01/01/2023   HGBA1C 5.9 (H) 12/16/2021   HGBA1C 5.7 (H) 01/17/2021   Lab Results  Component Value Date   INSULIN 20.1 01/01/2023    Plan: Continue Metformin 500 mg once daily breakfast  2. Other depression/emotional eating Stacey Kaiser has had issues with stress/emotional eating. She feels "a little stir crazy" since finishing masters degree  in May ([employer]) because she does not have anything to do.  She is out of work on disability.  She plans on starting work on another masters degree.Marland Kitchen  Has been off of sertraline 50 mg daily for about 6 weeks now.  Discontinued because she wanted to reduce the amount of pills she takes.  Plan: Encouraged her to consider restarting sertraline.  3. Morbid Obesity: Current BMI 62  Stacey Kaiser is currently in the action stage of change. As such, her goal is to continue with weight loss efforts.  She has agreed to keeping a food journal with goal of 1200-1400 calories and 90 grams of protein daily.  1.  Download the lose it app and begin journaling. 2.  May have 1 protein shake per day.  Exercise goals:  as is  Behavioral modification strategies: increasing lean protein intake, decreasing simple carbohydrates , meal planning , and keep a strict food journal.  Stacey Kaiser has agreed to follow-up with our clinic in 4 weeks.  No orders of the defined types were placed in this encounter.   There are no discontinued medications.   No orders of the defined types were placed in this encounter.     Objective:   VITALS: Per patient if applicable, see vitals. GENERAL: Alert and in no acute distress. CARDIOPULMONARY: No increased WOB. Speaking in clear sentences.  PSYCH: Pleasant and cooperative. Speech normal rate and rhythm. Affect is appropriate. Insight and judgement are appropriate. Attention is focused, linear, and appropriate.  NEURO: Oriented as arrived to appointment on time with no prompting.   Attestation Statements:   Reviewed by clinician on day  of visit: allergies, medications, problem list, medical history, surgical history, family history, social history, and previous encounter notes.  Time spent on visit including the items listed below was 30 minutes.  -preparing to see the patient (e.g., review of tests, history, previous notes) -obtaining and/or reviewing separately  obtained history -counseling and educating the patient/family/caregiver -documenting clinical information in the electronic or other health record -ordering medications, tests, or procedures -independently interpreting results and communicating results to the patient/ family/caregiver -referring and communicating with other health care professionals  -care coordination   This was prepared with the assistance of Engineer, civil (consulting).  Occasional wrong-word or sound-a-like substitutions may have occurred due to the inherent limitations of voice recognition software.

## 2023-03-30 ENCOUNTER — Telehealth: Payer: Self-pay

## 2023-03-30 NOTE — Telephone Encounter (Signed)
Called patient unable to make contact or leave voicemail   Paperwork has been faxed and a copy at the front

## 2023-04-03 NOTE — Therapy (Unsigned)
OUTPATIENT PHYSICAL THERAPY THORACOLUMBAR EVALUATION   Patient Name: Stacey Kaiser MRN: [id number] DOB:01-06-1971, 52 y.o., female Today's Date: 04/04/2023  END OF SESSION:  PT End of Session - 04/04/23 1217     Visit Number 1    Number of Visits 6    Date for PT Re-Evaluation 05/30/23    Authorization Type [employer]    PT Start Time 0915    PT Stop Time 1000    PT Time Calculation (min) 45 min    Activity Tolerance Patient tolerated treatment well    Behavior During Therapy [facility] for tasks assessed/performed             Past Medical History:  Diagnosis Date   Back pain    Depression    Diskitis 01/17/2021   Elevated serum creatinine 03/24/2021   Family history of adverse reaction to anesthesia    " my mother had a seizure coming out of cabg surgery "   Hypertension    Joint pain    Morbid obesity (HCC)    Morbid obesity with BMI of 50.0-59.9, adult (HCC) 01/17/2021   Pre-diabetes    Scoliosis    Sleep apnea    SOB (shortness of breath)    Swelling of both lower extremities    Past Surgical History:  Procedure Laterality Date   IR FLUORO GUIDED NEEDLE PLC ASPIRATION/INJECTION LOC  01/18/2021   RADIOLOGY WITH ANESTHESIA N/A 01/18/2021   Procedure: IR WITH ANESTHESIA;  Surgeon: Radiologist, Medication, MD;  Location: [facility] OR;  Service: Radiology;  Laterality: N/A;   WISDOM TOOTH EXTRACTION     Patient Active Problem List   Diagnosis Date Noted   Left Leg cramp 03/20/2023   Depression 03/20/2023   BMI 60.0-69.9, adult (HCC) 03/20/2023   Prediabetes 01/15/2023   Other hyperlipidemia 01/15/2023   Hyperglycemia 01/01/2023   Vitamin D deficiency 01/01/2023   OSA (obstructive sleep apnea) 01/01/2023   Other fatigue 01/01/2023   SOBOE (shortness of breath on exertion) 01/01/2023   Depression screening 01/01/2023   Obesity, Beginning BMI 66.1 01/01/2023   Situational depression 04/12/2022   Snoring 12/13/2021   Hypertension 09/13/2021   Osteomyelitis of  thoracic vertebra (HCC) 01/17/2021    PCP: Storm Frisk, MD   REFERRING PROVIDER: Floreen Comber, NP  REFERRING DIAG: M54.6 (ICD-10-CM) - Pain in thoracic spine  Rationale for Evaluation and Treatment: Rehabilitation  THERAPY DIAG:  Pain in thoracic spine  Postural deformity  Muscle weakness (generalized)  ONSET DATE: chronic  SUBJECTIVE:  SUBJECTIVE STATEMENT: Describes a history of spasms to mid back region.  PERTINENT HISTORY:    PAIN:  Are you having pain? Yes: NPRS scale: 10/10 Pain location: mid back Pain description: ache Aggravating factors: activity in general Relieving factors: position changes  PRECAUTIONS: Other: thoracic diskitis /osteomyelitis  WEIGHT BEARING RESTRICTIONS: No  FALLS:  Has patient fallen in last 6 months? 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 Persistent but decreased degree of epidural phlegmon resulting in at a lesser degree of spinal canal narrowing, now mild-moderate.   Unchanged 50% height loss of T8 and 75% height loss of T9. Unchanged resultant focal kyphosis at T8-T9 with kinking and mild flattening of the cord. No abnormal cord signal.   Endplate changes at T7-T8 are similar to prior exam, and consistent with Modic degenerative change with possible superimposed infectious process given mild edema and enhancement at this level.     Electronically Signed   By: Caprice Renshaw   On: 03/31/2021 13:15  PATIENT SURVEYS:  FOTO 40 (53 predicted)  SCREENING FOR RED FLAGS: Bowel or bladder  incontinence: No  MUSCLE LENGTH: Hamstrings: Right 80 deg; Left 80 deg   POSTURE: rounded shoulders and increased thoracic kyphosis  PALPATION: deferred  LUMBAR ROM: Deferred due to pain  AROM eval  Flexion   Extension   Right lateral flexion   Left lateral flexion   Right rotation   Left rotation    (Blank rows = not tested)  LOWER EXTREMITY ROM:   WFL for gait, transfers and mobility needs  Passive  Right eval Left eval  Hip flexion    Hip extension    Hip abduction    Hip adduction    Hip internal rotation    Hip external rotation    Knee flexion    Knee extension    Ankle dorsiflexion    Ankle plantarflexion    Ankle inversion    Ankle eversion     (Blank rows = not tested)  LOWER EXTREMITY MMT:    MMT Right eval Left eval  Hip flexion 4- 4-  Hip extension 4- 4-  Hip abduction 4- 4-  Hip adduction    Hip internal rotation    Hip external rotation    Knee flexion 4- 4-  Knee extension 4- 4-  Ankle dorsiflexion    Ankle plantarflexion 4- 4-  Ankle inversion    Ankle eversion     (Blank rows = not tested)  LUMBAR SPECIAL TESTS:  Straight leg raise test: Negative and Slump test: Negative  FUNCTIONAL TESTS:  30 seconds chair stand test 7 reps  GAIT: Distance walked: 65ftx2 Assistive device utilized: Walker - 2 wheeled Level of assistance: Complete Independence Comments: slow cadence  TODAY'S TREATMENT:  DATE: 04/04/23 Eval and HEP   PATIENT EDUCATION:  Education details: Discussed eval findings, rehab rationale and POC and patient is in agreement  Person educated: Patient Education method: Explanation Education comprehension: verbalized understanding and needs further education  HOME EXERCISE PROGRAM: Access Code: [email or web address] URL: [email or web address] Date: 04/04/2023 Prepared by: Gustavus Bryant  Exercises - Supine March  - 2 x daily - 5 x weekly - 1 sets - 10 reps - Seated Long Arc Quad  - 2 x daily - 5 x weekly - 1 sets - 10 reps - Standing Heel Raise with Support  - 2 x daily - 5 x weekly - 1 sets - 10 reps  ASSESSMENT:  CLINICAL IMPRESSION: Patient is a 52 y.o. female who was seen today for physical therapy evaluation and treatment for chronic thoracic pain due to underlying diskitis and osteomyelitis. Patient unable to find comfortable position or identify distinct aggravating or relieving factors. Nerve tension testing is negative, LE ROM WFL, 30s chair stand test limited to 7 reps, LE weakness noted.  Unabto tolerate core tasks due to radiating L flank pain with spinal flexion.  MRI pending 6 weeks of PT. Patient would benefit from 4 session of aquatic PT to incorporate strengthening in a gravity reduced environment.  OBJECTIVE IMPAIRMENTS: Abnormal gait, decreased activity tolerance, decreased endurance, decreased knowledge of condition, decreased mobility, difficulty walking, decreased ROM, decreased strength, impaired perceived functional ability, improper body mechanics, postural dysfunction, obesity, and pain.   ACTIVITY LIMITATIONS: carrying, lifting, bending, sitting, standing, squatting, sleeping, stairs, transfers, and bed mobility  PERSONAL FACTORS: Age, Fitness, Time since onset of injury/illness/exacerbation, and 1 comorbidity: osteomyelitis  are also affecting patient's functional outcome.   REHAB POTENTIAL: Fair based on imaging studies  CLINICAL DECISION MAKING: Evolving/moderate complexity  EVALUATION COMPLEXITY: Low   GOALS: Goals reviewed with patient? No  SHORT TERM GOALS=LONG TERM GOALS: Target date: 05/16/2023  Patient to demonstrate independence in HEP  Baseline: [id number] Goal status: INITIAL  2.  Increase FOTO score to 53 Baseline: 40 Goal status: INITIAL  3.  Decrease worst pain to 8/10 Baseline: 10/10 Goal status: INITIAL  4.   Increase 30s chair stand test to 9 reps Baseline: 7 reps Goal status: INITIAL   PLAN:  PT FREQUENCY: 1-2x/week  PT DURATION: 6 weeks  PLANNED INTERVENTIONS: Therapeutic exercises, Therapeutic activity, Neuromuscular re-education, Balance training, Gait training, Patient/Family education, Self Care, Joint mobilization, DME instructions, Dry Needling, Cryotherapy, Moist heat, Manual therapy, and Re-evaluation.  PLAN FOR NEXT SESSION: HEP review and update, manual techniques as appropriate, aerobic tasks, ROM and flexibility activities, strengthening and PREs, TPDN, gait and balance training as needed     Hildred Laser, PT 04/04/2023, 12:18 PM

## 2023-04-04 ENCOUNTER — Telehealth: Payer: Self-pay | Admitting: Critical Care Medicine

## 2023-04-04 ENCOUNTER — Ambulatory Visit: Payer: Commercial Managed Care - HMO | Attending: Student

## 2023-04-04 ENCOUNTER — Other Ambulatory Visit: Payer: Self-pay

## 2023-04-04 DIAGNOSIS — M439 Deforming dorsopathy, unspecified: Secondary | ICD-10-CM | POA: Insufficient documentation

## 2023-04-04 DIAGNOSIS — M546 Pain in thoracic spine: Secondary | ICD-10-CM | POA: Insufficient documentation

## 2023-04-04 DIAGNOSIS — M6281 Muscle weakness (generalized): Secondary | ICD-10-CM | POA: Insufficient documentation

## 2023-04-04 NOTE — Telephone Encounter (Signed)
Copied from CRM [id number]. Topic: General - Inquiry >> Apr 04, 2023  2:56 PM Marlow Baars wrote: Reason for CRM: The patient called in stating [employer] her disability company told her they would fax over an attending physician statement on Monday the 17th of June. The patient is checking on status to make sure the provider received this statement and will fill it out. Please assist patient further.

## 2023-04-04 NOTE — Patient Instructions (Signed)
Aquatic Therapy at [facility]-  What to Expect!  Where:   [facility] [address] [facility] [place], [place] [address] Rehab phone [phone]  NOTE:  You will receive an automated phone message reminding you of your appt and it will say the appointment is at the [address] [facility] [address] [facility] clinic.          How to Prepare: Please make sure you drink 8 ounces of water about one hour prior to your pool session A caregiver may attend if needed with the patient to help assist as needed. A caregiver can sit in the pool room on chair. Please arrive IN YOUR SUIT and 15 minutes prior to your appointment - this helps to avoid delays in starting your session. Please make sure to attend to any toileting needs prior to entering the pool Locker rooms for changing are provided.   There is direct access to the pool deck form the locker room.  You can lock your belongings in a locker with lock provided. Once on the pool deck your therapist will ask if you have signed the Patient  Consent and Assignment of Benefits form before beginning treatment Your therapist may take your blood pressure prior to, during and after your session if indicated We usually try and create a home exercise program based on activities we do in the pool.  Please be thinking about who might be able to assist you in the pool should you need to participate in an aquatic home exercise program at the time of discharge if you need assistance.  Some patients do not want to or do not have the ability to participate in an aquatic home program - this is not a barrier in any way to you participating in aquatic therapy as part of your current therapy plan! After Discharge from PT, you can continue using home program at  the [facility]/, there is a drop-in fee for $5 ($45 a month)or for 60 years  or older $4.00 ($40 a month for seniors ) or any local [facility] pool.  Memberships for purchase are  available for gym/pool at [facility]  IT IS VERY IMPORTANT THAT YOUR LAST VISIT BE IN THE CLINIC AT [address] AFTER YOUR LAST AQUATIC VISIT.  PLEASE MAKE SURE THAT YOU HAVE A [address]  APPOINTMENT SCHEDULED.   About the pool: Pool is located approximately 500 FT from the entrance of the building.  Please bring a support person if you need assistance traveling this      distance.   Your therapist will assist you in entering the water; there are two ways to           enter: stairs with railings, and a mechanical lift. Your therapist will determine the most appropriate way for you.  Water temperature is usually between 88-90 degrees  There may be up to 2 other swimmers in the pool at the same time  The pool deck is tile, please wear shoes with good traction if you prefer not to be barefoot.    Contact Info:  For appointment scheduling and cancellations:         Please call the [facility]  PH:[phone]              Aquatic Therapy  [facility]       All sessions are 45 minutes                                                    

## 2023-04-05 NOTE — Telephone Encounter (Signed)
FYI placed in providers box

## 2023-04-05 NOTE — Telephone Encounter (Signed)
Stacey Kaiser this is the patient we discussed thank you for your help

## 2023-04-05 NOTE — Telephone Encounter (Signed)
Medical release has been sent to records department and fax number has been provided for records to be sent.

## 2023-04-09 ENCOUNTER — Encounter (INDEPENDENT_AMBULATORY_CARE_PROVIDER_SITE_OTHER): Payer: Self-pay | Admitting: Family Medicine

## 2023-04-09 ENCOUNTER — Telehealth (INDEPENDENT_AMBULATORY_CARE_PROVIDER_SITE_OTHER): Payer: Commercial Managed Care - HMO | Admitting: Family Medicine

## 2023-04-09 DIAGNOSIS — R7303 Prediabetes: Secondary | ICD-10-CM

## 2023-04-09 DIAGNOSIS — Z6841 Body Mass Index (BMI) 40.0 and over, adult: Secondary | ICD-10-CM

## 2023-04-09 DIAGNOSIS — F3289 Other specified depressive episodes: Secondary | ICD-10-CM

## 2023-04-11 NOTE — Therapy (Unsigned)
Daily Note   Patient Name: Stacey Kaiser MRN: [id number] DOB:[birth year], 52 y.o., female Today's Date: 04/12/2023  END OF SESSION:  PT End of Session - 04/12/23 1528     Visit Number 2    Number of Visits 6    Date for PT Re-Evaluation 05/30/23    Authorization Type [employer]    PT Start Time 1530    PT Stop Time 1612    PT Time Calculation (min) 42 min    Activity Tolerance Patient tolerated treatment well    Behavior During Therapy [facility] for tasks assessed/performed              Past Medical History:  Diagnosis Date   Back pain    Depression    Diskitis 01/17/2021   Elevated serum creatinine 03/24/2021   Family history of adverse reaction to anesthesia    " my mother had a seizure coming out of cabg surgery "   Hypertension    Joint pain    Morbid obesity (HCC)    Morbid obesity with BMI of 50.0-59.9, adult (HCC) 01/17/2021   Pre-diabetes    Scoliosis    Sleep apnea    SOB (shortness of breath)    Swelling of both lower extremities    Past Surgical History:  Procedure Laterality Date   IR FLUORO GUIDED NEEDLE PLC ASPIRATION/INJECTION LOC  01/18/2021   RADIOLOGY WITH ANESTHESIA N/A 01/18/2021   Procedure: IR WITH ANESTHESIA;  Surgeon: Radiologist, Medication, MD;  Location: [facility] OR;  Service: Radiology;  Laterality: N/A;   WISDOM TOOTH EXTRACTION     Patient Active Problem List   Diagnosis Date Noted   Left Leg cramp 03/20/2023   Depression 03/20/2023   BMI 60.0-69.9, adult (HCC) 03/20/2023   Prediabetes 01/15/2023   Other hyperlipidemia 01/15/2023   Hyperglycemia 01/01/2023   Vitamin D deficiency 01/01/2023   OSA (obstructive sleep apnea) 01/01/2023   Other fatigue 01/01/2023   SOBOE (shortness of breath on exertion) 01/01/2023   Depression screening 01/01/2023   Obesity, Beginning BMI 66.1 01/01/2023   Situational depression 04/12/2022   Snoring 12/13/2021   Hypertension 09/13/2021   Osteomyelitis of thoracic vertebra (HCC) 01/17/2021    PCP:  Storm Frisk, MD   REFERRING PROVIDER: Floreen Comber, NP  REFERRING DIAG: M54.6 (ICD-10-CM) - Pain in thoracic spine  Rationale for Evaluation and Treatment: Rehabilitation  THERAPY DIAG:  Pain in thoracic spine  Postural deformity  Muscle weakness (generalized)  ONSET DATE: chronic  SUBJECTIVE:  SUBJECTIVE STATEMENT: Pt states she has taken it easy all morning so that she would be able to complete therapy.  She rates her pain @ 6/10 currently  PERTINENT HISTORY:    PAIN:  Are you having pain? Yes: NPRS scale: 10/10 Pain location: mid back Pain description: ache Aggravating factors: activity in general Relieving factors: position changes  PRECAUTIONS: Other: thoracic diskitis /osteomyelitis  WEIGHT BEARING RESTRICTIONS: No  FALLS:  Has patient fallen in last 6 months? 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 Persistent but decreased degree of epidural phlegmon resulting in at a lesser degree of spinal canal narrowing, now mild-moderate.   Unchanged 50% height loss of T8 and 75% height loss of T9. Unchanged resultant focal kyphosis at T8-T9 with kinking and mild flattening of the cord. No abnormal cord signal.   Endplate changes at T7-T8 are similar to prior exam, and consistent with Modic degenerative change with possible superimposed infectious process given mild edema and enhancement at this level.     Electronically Signed   By: Caprice Renshaw   On: 03/31/2021 13:15  PATIENT SURVEYS:  FOTO 40 (53 predicted)  SCREENING  FOR RED FLAGS: Bowel or bladder incontinence: No  MUSCLE LENGTH: Hamstrings: Right 80 deg; Left 80 deg   POSTURE: rounded shoulders and increased thoracic kyphosis  PALPATION: deferred  LUMBAR ROM: Deferred due to pain  AROM eval  Flexion   Extension   Right lateral flexion   Left lateral flexion   Right rotation   Left rotation    (Blank rows = not tested)  LOWER EXTREMITY ROM:   WFL for gait, transfers and mobility needs  Passive  Right eval Left eval  Hip flexion    Hip extension    Hip abduction    Hip adduction    Hip internal rotation    Hip external rotation    Knee flexion    Knee extension    Ankle dorsiflexion    Ankle plantarflexion    Ankle inversion    Ankle eversion     (Blank rows = not tested)  LOWER EXTREMITY MMT:    MMT Right eval Left eval  Hip flexion 4- 4-  Hip extension 4- 4-  Hip abduction 4- 4-  Hip adduction    Hip internal rotation    Hip external rotation    Knee flexion 4- 4-  Knee extension 4- 4-  Ankle dorsiflexion    Ankle plantarflexion 4- 4-  Ankle inversion    Ankle eversion     (Blank rows = not tested)  LUMBAR SPECIAL TESTS:  Straight leg raise test: Negative and Slump test: Negative  FUNCTIONAL TESTS:  30 seconds chair stand test 7 reps  GAIT: Distance walked: 38ftx2 Assistive device utilized: Walker - 2 wheeled Level of assistance: Complete Independence Comments: slow cadence  TODAY'S TREATMENT:  DATE:  TREATMENT 04/12/23:  Aquatic therapy at [facility]- [address] - therapeutic pool temp 92 degrees Pt enters building independently.  Treatment took place in water 3.8 to  4 ft 8 in.feet deep depending upon activity.  Pt entered and exited the pool via stair and handrails    Aquatic Therapy:  Water walking for warm up fwd/lat/bkwds  Standing: Gentle noodle rotations   Blue noodle push down Standing march Hip abd/add x20 BIL Heel raises - x20 Squats x20 Step ups on submerged step 2x10 BIL Sit to stand - 2x10   Gait/LE: Lateral walking with dumbell add/abd    Pt requires the buoyancy of water for active assisted exercises with buoyancy supported for strengthening and AROM exercises. Hydrostatic pressure also supports joints by unweighting joint load by at least 50 % in 3-4 feet depth water. 80% in chest to neck deep water. Water will provide assistance with movement using the current and laminar flow while the buoyancy reduces weight bearing. Pt requires the viscosity of the water for resistance with strengthening exercises.   HOME EXERCISE PROGRAM: Access Code: [email or web address] URL: [email or web address] Date: 04/04/2023 Prepared by: Gustavus Bryant  Exercises - Supine March  - 2 x daily - 5 x weekly - 1 sets - 10 reps - Seated Long Arc Quad  - 2 x daily - 5 x weekly - 1 sets - 10 reps - Standing Heel Raise with Support  - 2 x daily - 5 x weekly - 1 sets - 10 reps  ASSESSMENT:  CLINICAL IMPRESSION: Session today focused on gentle thoracic mobility and periscapular strengthening combined with LE strength and endurance in the aquatic environment for use of buoyancy to offload joints and the viscosity of water as resistance during therapeutic exercise.  Pt with slight increase in pain and fatigue following therapy but overall good tolerance.  Patient was able to tolerate all prescribed exercises in the aquatic environment with no adverse effects and reports 6/10 pain at the end of the session. Patient continues to benefit from skilled PT services on land and aquatic based and should be progressed as able to improve functional independence.    OBJECTIVE IMPAIRMENTS: Abnormal gait, decreased activity tolerance, decreased endurance, decreased knowledge of condition, decreased mobility, difficulty walking, decreased ROM, decreased strength,  impaired perceived functional ability, improper body mechanics, postural dysfunction, obesity, and pain.   ACTIVITY LIMITATIONS: carrying, lifting, bending, sitting, standing, squatting, sleeping, stairs, transfers, and bed mobility  PERSONAL FACTORS: Age, Fitness, Time since onset of injury/illness/exacerbation, and 1 comorbidity: osteomyelitis  are also affecting patient's functional outcome.   REHAB POTENTIAL: Fair based on imaging studies  CLINICAL DECISION MAKING: Evolving/moderate complexity  EVALUATION COMPLEXITY: Low   GOALS: Goals reviewed with patient? No  SHORT TERM GOALS=LONG TERM GOALS: Target date: 05/16/2023  Patient to demonstrate independence in HEP  Baseline: [id number] Goal status: INITIAL  2.  Increase FOTO score to 53 Baseline: 40 Goal status: INITIAL  3.  Decrease worst pain to 8/10 Baseline: 10/10 Goal status: INITIAL  4.  Increase 30s chair stand test to 9 reps Baseline: 7 reps Goal status: INITIAL   PLAN:  PT FREQUENCY: 1-2x/week  PT DURATION: 6 weeks  PLANNED INTERVENTIONS: Therapeutic exercises, Therapeutic activity, Neuromuscular re-education, Balance training, Gait training, Patient/Family education, Self Care, Joint mobilization, DME instructions, Dry Needling, Cryotherapy, Moist heat, Manual therapy, and Re-evaluation.  PLAN FOR NEXT SESSION: HEP review and update, manual techniques as appropriate, aerobic tasks, ROM and flexibility activities, strengthening and PREs, TPDN, gait and balance  training as needed     Fredderick Phenix, PT 04/12/2023, 4:17 PM

## 2023-04-12 ENCOUNTER — Ambulatory Visit: Payer: Commercial Managed Care - HMO | Admitting: Physical Therapy

## 2023-04-12 ENCOUNTER — Encounter: Payer: Self-pay | Admitting: Physical Therapy

## 2023-04-12 DIAGNOSIS — M546 Pain in thoracic spine: Secondary | ICD-10-CM | POA: Diagnosis not present

## 2023-04-12 DIAGNOSIS — M439 Deforming dorsopathy, unspecified: Secondary | ICD-10-CM

## 2023-04-12 DIAGNOSIS — M6281 Muscle weakness (generalized): Secondary | ICD-10-CM

## 2023-04-16 ENCOUNTER — Ambulatory Visit: Payer: Commercial Managed Care - HMO | Attending: Student

## 2023-04-16 DIAGNOSIS — M6281 Muscle weakness (generalized): Secondary | ICD-10-CM | POA: Diagnosis present

## 2023-04-16 DIAGNOSIS — M439 Deforming dorsopathy, unspecified: Secondary | ICD-10-CM | POA: Diagnosis present

## 2023-04-16 DIAGNOSIS — M546 Pain in thoracic spine: Secondary | ICD-10-CM | POA: Diagnosis present

## 2023-04-16 NOTE — Therapy (Signed)
Daily Note   Patient Name: Stacey Kaiser MRN: [id number] DOB:1970-11-30, 51 y.o., female Today's Date: 04/16/2023  END OF SESSION:  PT End of Session - 04/16/23 0904     Visit Number 3    Number of Visits 6    Date for PT Re-Evaluation 05/30/23    Authorization Type [employer]    PT Start Time 0915    PT Stop Time 0946    PT Time Calculation (min) 31 min    Activity Tolerance Patient limited by pain    Behavior During Therapy [facility] for tasks assessed/performed              Past Medical History:  Diagnosis Date   Back pain    Depression    Diskitis 01/17/2021   Elevated serum creatinine 03/24/2021   Family history of adverse reaction to anesthesia    " my mother had a seizure coming out of cabg surgery "   Hypertension    Joint pain    Morbid obesity (HCC)    Morbid obesity with BMI of 50.0-59.9, adult (HCC) 01/17/2021   Pre-diabetes    Scoliosis    Sleep apnea    SOB (shortness of breath)    Swelling of both lower extremities    Past Surgical History:  Procedure Laterality Date   IR FLUORO GUIDED NEEDLE PLC ASPIRATION/INJECTION LOC  01/18/2021   RADIOLOGY WITH ANESTHESIA N/A 01/18/2021   Procedure: IR WITH ANESTHESIA;  Surgeon: Radiologist, Medication, MD;  Location: [facility] OR;  Service: Radiology;  Laterality: N/A;   WISDOM TOOTH EXTRACTION     Patient Active Problem List   Diagnosis Date Noted   Left Leg cramp 03/20/2023   Depression 03/20/2023   BMI 60.0-69.9, adult (HCC) 03/20/2023   Prediabetes 01/15/2023   Other hyperlipidemia 01/15/2023   Hyperglycemia 01/01/2023   Vitamin D deficiency 01/01/2023   OSA (obstructive sleep apnea) 01/01/2023   Other fatigue 01/01/2023   SOBOE (shortness of breath on exertion) 01/01/2023   Depression screening 01/01/2023   Obesity, Beginning BMI 66.1 01/01/2023   Situational depression 04/12/2022   Snoring 12/13/2021   Hypertension 09/13/2021   Osteomyelitis of thoracic vertebra (HCC) 01/17/2021    PCP: Storm Frisk, MD   REFERRING PROVIDER: Floreen Comber, NP  REFERRING DIAG: M54.6 (ICD-10-CM) - Pain in thoracic spine  Rationale for Evaluation and Treatment: Rehabilitation  THERAPY DIAG:  Pain in thoracic spine  Postural deformity  Muscle weakness (generalized)  ONSET DATE: chronic  SUBJECTIVE:  SUBJECTIVE STATEMENT: Patient reports that the aquatic session went well, it still hurt but it hurt less than on land. She states that she is going to have an MRI after completing physical therapy.   PERTINENT HISTORY:    PAIN:  Are you having pain? Yes: NPRS scale: 5/10 Pain location: mid back Pain description: ache Aggravating factors: activity in general Relieving factors: position changes  PRECAUTIONS: Other: thoracic diskitis /osteomyelitis  WEIGHT BEARING RESTRICTIONS: No  FALLS:  Has patient fallen in last 6 months? 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 Persistent but decreased degree of epidural phlegmon resulting in at a lesser degree of spinal canal narrowing, now mild-moderate.   Unchanged 50% height loss of T8 and 75% height loss of T9. Unchanged resultant focal kyphosis at T8-T9 with kinking and mild flattening of the cord. No abnormal cord signal.   Endplate changes at T7-T8 are similar to prior exam, and consistent with Modic degenerative change with possible superimposed infectious process given mild edema and enhancement at this level.     Electronically Signed   By: Caprice Renshaw   On: 03/31/2021 13:15  PATIENT  SURVEYS:  FOTO 40 (53 predicted)  SCREENING FOR RED FLAGS: Bowel or bladder incontinence: No  MUSCLE LENGTH: Hamstrings: Right 80 deg; Left 80 deg   POSTURE: rounded shoulders and increased thoracic kyphosis  PALPATION: deferred  LUMBAR ROM: Deferred due to pain  AROM eval  Flexion   Extension   Right lateral flexion   Left lateral flexion   Right rotation   Left rotation    (Blank rows = not tested)  LOWER EXTREMITY ROM:   WFL for gait, transfers and mobility needs  Passive  Right eval Left eval  Hip flexion    Hip extension    Hip abduction    Hip adduction    Hip internal rotation    Hip external rotation    Knee flexion    Knee extension    Ankle dorsiflexion    Ankle plantarflexion    Ankle inversion    Ankle eversion     (Blank rows = not tested)  LOWER EXTREMITY MMT:    MMT Right eval Left eval  Hip flexion 4- 4-  Hip extension 4- 4-  Hip abduction 4- 4-  Hip adduction    Hip internal rotation    Hip external rotation    Knee flexion 4- 4-  Knee extension 4- 4-  Ankle dorsiflexion    Ankle plantarflexion 4- 4-  Ankle inversion    Ankle eversion     (Blank rows = not tested)  LUMBAR SPECIAL TESTS:  Straight leg raise test: Negative and Slump test: Negative  FUNCTIONAL TESTS:  30 seconds chair stand test 7 reps  GAIT: Distance walked: 93ftx2 Assistive device utilized: Walker - 2 wheeled Level of assistance: Complete Independence Comments: slow cadence  TODAY'S TREATMENT:  DATEMarlane Mingle Adult PT Treatment:                                                DATE: 04/16/27 Therapeutic Exercise: Nustep level 2 x 2 mins (painful in lower back) Seated heel/toe raises 2x10  Seated marching 2x30" Seated hip adduction ball squeeze 5" hold 2x10 Seated LAQ with adduction x5 BIL (too painful Seated BIL ER with scap  retraction RTB 2x10 Supine marching 2x30" Manual Therapy: Supine exercises on cold pack 2x10   TREATMENT 04/12/23:  Aquatic therapy at [facility]- [address] - therapeutic pool temp 92 degrees Pt enters building independently.  Treatment took place in water 3.8 to  4 ft 8 in.feet deep depending upon activity.  Pt entered and exited the pool via stair and handrails    Aquatic Therapy:  Water walking for warm up fwd/lat/bkwds  Standing: Gentle noodle rotations  Blue noodle push down Standing march Hip abd/add x20 BIL Heel raises - x20 Squats x20 Step ups on submerged step 2x10 BIL Sit to stand - 2x10   Gait/LE: Lateral walking with dumbell add/abd    Pt requires the buoyancy of water for active assisted exercises with buoyancy supported for strengthening and AROM exercises. Hydrostatic pressure also supports joints by unweighting joint load by at least 50 % in 3-4 feet depth water. 80% in chest to neck deep water. Water will provide assistance with movement using the current and laminar flow while the buoyancy reduces weight bearing. Pt requires the viscosity of the water for resistance with strengthening exercises.   HOME EXERCISE PROGRAM: Access Code: [email or web address] URL: [email or web address] Date: 04/04/2023 Prepared by: Gustavus Bryant  Exercises - Supine March  - 2 x daily - 5 x weekly - 1 sets - 10 reps - Seated Long Arc Quad  - 2 x daily - 5 x weekly - 1 sets - 10 reps - Standing Heel Raise with Support  - 2 x daily - 5 x weekly - 1 sets - 10 reps  ASSESSMENT:  CLINICAL IMPRESSION: Patient presents to PT reporting continued back pain and that aquatics went well last week, she still had pain but it was less than on land. Session today focused on gentle mat based strengthening, though patient was very limited by pain throughout session. Patient will benefit more from aquatic therapy moving forward. Patient continues to benefit from skilled PT services  and should be progressed as able to improve functional independence.   OBJECTIVE IMPAIRMENTS: Abnormal gait, decreased activity tolerance, decreased endurance, decreased knowledge of condition, decreased mobility, difficulty walking, decreased ROM, decreased strength, impaired perceived functional ability, improper body mechanics, postural dysfunction, obesity, and pain.   ACTIVITY LIMITATIONS: carrying, lifting, bending, sitting, standing, squatting, sleeping, stairs, transfers, and bed mobility  PERSONAL FACTORS: Age, Fitness, Time since onset of injury/illness/exacerbation, and 1 comorbidity: osteomyelitis  are also affecting patient's functional outcome.   REHAB POTENTIAL: Fair based on imaging studies  CLINICAL DECISION MAKING: Evolving/moderate complexity  EVALUATION COMPLEXITY: Low   GOALS: Goals reviewed with patient? No  SHORT TERM GOALS=LONG TERM GOALS: Target date: 05/16/2023  Patient to demonstrate independence in HEP  Baseline: [id number] Goal status: INITIAL  2.  Increase FOTO score to 53 Baseline: 40 Goal status: INITIAL  3.  Decrease worst pain to 8/10 Baseline: 10/10 Goal status: INITIAL  4.  Increase 30s chair stand test  to 9 reps Baseline: 7 reps Goal status: INITIAL   PLAN:  PT FREQUENCY: 1-2x/week  PT DURATION: 6 weeks  PLANNED INTERVENTIONS: Therapeutic exercises, Therapeutic activity, Neuromuscular re-education, Balance training, Gait training, Patient/Family education, Self Care, Joint mobilization, DME instructions, Dry Needling, Cryotherapy, Moist heat, Manual therapy, and Re-evaluation.  PLAN FOR NEXT SESSION: HEP review and update, manual techniques as appropriate, aerobic tasks, ROM and flexibility activities, strengthening and PREs, TPDN, gait and balance training as needed     Berta Minor, PTA 04/16/2023, 9:50 AM

## 2023-04-18 ENCOUNTER — Encounter: Payer: Self-pay | Admitting: Critical Care Medicine

## 2023-04-20 ENCOUNTER — Encounter: Payer: Commercial Managed Care - HMO | Admitting: Physical Therapy

## 2023-04-24 MED ORDER — SERTRALINE HCL 50 MG PO TABS
50.0000 mg | ORAL_TABLET | Freq: Every day | ORAL | 3 refills | Status: DC
Start: 2023-04-24 — End: 2023-08-15

## 2023-04-26 ENCOUNTER — Encounter: Payer: Self-pay | Admitting: Physical Therapy

## 2023-04-26 ENCOUNTER — Ambulatory Visit: Payer: Commercial Managed Care - HMO | Admitting: Physical Therapy

## 2023-04-26 DIAGNOSIS — M6281 Muscle weakness (generalized): Secondary | ICD-10-CM

## 2023-04-26 DIAGNOSIS — M546 Pain in thoracic spine: Secondary | ICD-10-CM

## 2023-04-26 DIAGNOSIS — M439 Deforming dorsopathy, unspecified: Secondary | ICD-10-CM

## 2023-04-26 NOTE — Therapy (Signed)
Daily Note   Patient Name: Stacey Kaiser MRN: [id number] DOB:1971-02-16, 52 y.o., female Today's Date: 04/27/2023  END OF SESSION:  PT End of Session - 04/26/23 1601     Visit Number 4    Number of Visits 6    Date for PT Re-Evaluation 05/30/23    Authorization Type [employer]    PT Start Time 1600    PT Stop Time 1642    PT Time Calculation (min) 42 min    Activity Tolerance Patient limited by pain    Behavior During Therapy [facility] for tasks assessed/performed               Past Medical History:  Diagnosis Date   Back pain    Depression    Diskitis 01/17/2021   Elevated serum creatinine 03/24/2021   Family history of adverse reaction to anesthesia    " my mother had a seizure coming out of cabg surgery "   Hypertension    Joint pain    Morbid obesity (HCC)    Morbid obesity with BMI of 50.0-59.9, adult (HCC) 01/17/2021   Pre-diabetes    Scoliosis    Sleep apnea    SOB (shortness of breath)    Swelling of both lower extremities    Past Surgical History:  Procedure Laterality Date   IR FLUORO GUIDED NEEDLE PLC ASPIRATION/INJECTION LOC  01/18/2021   RADIOLOGY WITH ANESTHESIA N/A 01/18/2021   Procedure: IR WITH ANESTHESIA;  Surgeon: Radiologist, Medication, MD;  Location: [facility] OR;  Service: Radiology;  Laterality: N/A;   WISDOM TOOTH EXTRACTION     Patient Active Problem List   Diagnosis Date Noted   Left Leg cramp 03/20/2023   Depression 03/20/2023   BMI 60.0-69.9, adult (HCC) 03/20/2023   Prediabetes 01/15/2023   Other hyperlipidemia 01/15/2023   Hyperglycemia 01/01/2023   Vitamin D deficiency 01/01/2023   OSA (obstructive sleep apnea) 01/01/2023   Other fatigue 01/01/2023   SOBOE (shortness of breath on exertion) 01/01/2023   Depression screening 01/01/2023   Obesity, Beginning BMI 66.1 01/01/2023   Situational depression 04/12/2022   Snoring 12/13/2021   Hypertension 09/13/2021   Osteomyelitis of thoracic vertebra (HCC) 01/17/2021    PCP:  Storm Frisk, MD   REFERRING PROVIDER: Floreen Comber, NP  REFERRING DIAG: M54.6 (ICD-10-CM) - Pain in thoracic spine  Rationale for Evaluation and Treatment: Rehabilitation  THERAPY DIAG:  Pain in thoracic spine  Postural deformity  Muscle weakness (generalized)  ONSET DATE: chronic  SUBJECTIVE:  SUBJECTIVE STATEMENT: Pt reports that she felt like she was "beaten with a bag of oranges" after land therapy, despite the low intensity.  She rates her pain at 7/10 today.  PERTINENT HISTORY:    PAIN:  Are you having pain? Yes: NPRS scale: 5/10 Pain location: mid back Pain description: ache Aggravating factors: activity in general Relieving factors: position changes  PRECAUTIONS: Other: thoracic diskitis /osteomyelitis  WEIGHT BEARING RESTRICTIONS: No  FALLS:  Has patient fallen in last 6 months? 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 Persistent but decreased degree of epidural phlegmon resulting in at a lesser degree of spinal canal narrowing, now mild-moderate.   Unchanged 50% height loss of T8 and 75% height loss of T9. Unchanged resultant focal kyphosis at T8-T9 with kinking and mild flattening of the cord. No abnormal cord signal.   Endplate changes at T7-T8 are similar to prior exam, and consistent with Modic degenerative change with possible superimposed infectious process given mild edema and enhancement at this level.     Electronically Signed   By: Caprice Renshaw   On: 03/31/2021 13:15  PATIENT SURVEYS:  FOTO 40 (53  predicted)  SCREENING FOR RED FLAGS: Bowel or bladder incontinence: No  MUSCLE LENGTH: Hamstrings: Right 80 deg; Left 80 deg   POSTURE: rounded shoulders and increased thoracic kyphosis  PALPATION: deferred  LUMBAR ROM: Deferred due to pain  AROM eval  Flexion   Extension   Right lateral flexion   Left lateral flexion   Right rotation   Left rotation    (Blank rows = not tested)  LOWER EXTREMITY ROM:   WFL for gait, transfers and mobility needs  Passive  Right eval Left eval  Hip flexion    Hip extension    Hip abduction    Hip adduction    Hip internal rotation    Hip external rotation    Knee flexion    Knee extension    Ankle dorsiflexion    Ankle plantarflexion    Ankle inversion    Ankle eversion     (Blank rows = not tested)  LOWER EXTREMITY MMT:    MMT Right eval Left eval  Hip flexion 4- 4-  Hip extension 4- 4-  Hip abduction 4- 4-  Hip adduction    Hip internal rotation    Hip external rotation    Knee flexion 4- 4-  Knee extension 4- 4-  Ankle dorsiflexion    Ankle plantarflexion 4- 4-  Ankle inversion    Ankle eversion     (Blank rows = not tested)  LUMBAR SPECIAL TESTS:  Straight leg raise test: Negative and Slump test: Negative  FUNCTIONAL TESTS:  30 seconds chair stand test 7 reps  GAIT: Distance walked: 70ftx2 Assistive device utilized: Walker - 2 wheeled Level of assistance: Complete Independence Comments: slow cadence  TODAY'S TREATMENT:  DATE:   TREATMENT 04/26/23:  Aquatic therapy at [facility]- [address] - therapeutic pool temp 92 degrees Pt enters building independently.  Treatment took place in water 3.8 to  4 ft 8 in.feet deep depending upon activity.  Pt entered and exited the pool via stair and handrails    Aquatic Therapy:  Water walking for warm up  fwd/lat/bkwds  Standing: Gentle noodle rotations  Blue noodle push down Ai chi 1-6 Standing march Hip abd/add x20 BIL Heel raises - x20 Squats x20 Step ups on submerged step 2x10 BIL Sit to stand - 2x10   Gait/LE: Lateral walking with dumbell add/abd    Pt requires the buoyancy of water for active assisted exercises with buoyancy supported for strengthening and AROM exercises. Hydrostatic pressure also supports joints by unweighting joint load by at least 50 % in 3-4 feet depth water. 80% in chest to neck deep water. Water will provide assistance with movement using the current and laminar flow while the buoyancy reduces weight bearing. Pt requires the viscosity of the water for resistance with strengthening exercises.  [facility] Adult PT Treatment:                                                DATE: 04/26/27 Therapeutic Exercise: Nustep level 2 x 2 mins (painful in lower back) Seated heel/toe raises 2x10  Seated marching 2x30" Seated hip adduction ball squeeze 5" hold 2x10 Seated LAQ with adduction x5 BIL (too painful Seated BIL ER with scap retraction RTB 2x10 Supine marching 2x30" Manual Therapy: Supine exercises on cold pack 2x10   TREATMENT 04/12/23:  Aquatic therapy at [facility]- [address] - therapeutic pool temp 92 degrees Pt enters building independently.  Treatment took place in water 3.8 to  4 ft 8 in.feet deep depending upon activity.  Pt entered and exited the pool via stair and handrails    Aquatic Therapy:  Water walking for warm up fwd/lat/bkwds  Standing: Gentle noodle rotations  Blue noodle push down Standing march Hip abd/add x20 BIL Heel raises - x20 Squats x20 Step ups on submerged step 2x10 BIL Sit to stand - 2x10   Gait/LE: Lateral walking with dumbell add/abd    Pt requires the buoyancy of water for active assisted exercises with buoyancy supported for strengthening and AROM exercises. Hydrostatic pressure also supports joints by  unweighting joint load by at least 50 % in 3-4 feet depth water. 80% in chest to neck deep water. Water will provide assistance with movement using the current and laminar flow while the buoyancy reduces weight bearing. Pt requires the viscosity of the water for resistance with strengthening exercises.   HOME EXERCISE PROGRAM: Access Code: [email or web address] URL: [email or web address] Date: 04/04/2023 Prepared by: Gustavus Bryant  Exercises - Supine March  - 2 x daily - 5 x weekly - 1 sets - 10 reps - Seated Long Arc Quad  - 2 x daily - 5 x weekly - 1 sets - 10 reps - Standing Heel Raise with Support  - 2 x daily - 5 x weekly - 1 sets - 10 reps  ASSESSMENT:  CLINICAL IMPRESSION: Session today focused on gentle thoracic mobility and general activity tolerance in the aquatic environment for use of buoyancy to offload joints and the viscosity of water as resistance during therapeutic exercise.  Able to move through intro Ai Chi postures  with relatively low pain levels but were not able to progress to more advance movements involving higher levels of thoracic rotation.  Patient was able to tolerate all prescribed exercises in the aquatic environment with no adverse effects and reports 6/10 pain at the end of the session. Patient continues to benefit from skilled PT services on land and aquatic based and should be progressed as able to improve functional independence.    OBJECTIVE IMPAIRMENTS: Abnormal gait, decreased activity tolerance, decreased endurance, decreased knowledge of condition, decreased mobility, difficulty walking, decreased ROM, decreased strength, impaired perceived functional ability, improper body mechanics, postural dysfunction, obesity, and pain.   ACTIVITY LIMITATIONS: carrying, lifting, bending, sitting, standing, squatting, sleeping, stairs, transfers, and bed mobility  PERSONAL FACTORS: Age, Fitness, Time since onset of injury/illness/exacerbation, and 1 comorbidity:  osteomyelitis  are also affecting patient's functional outcome.   REHAB POTENTIAL: Fair based on imaging studies  CLINICAL DECISION MAKING: Evolving/moderate complexity  EVALUATION COMPLEXITY: Low   GOALS: Goals reviewed with patient? No  SHORT TERM GOALS=LONG TERM GOALS: Target date: 05/16/2023  Patient to demonstrate independence in HEP  Baseline: [id number] Goal status: INITIAL  2.  Increase FOTO score to 53 Baseline: 40 Goal status: INITIAL  3.  Decrease worst pain to 8/10 Baseline: 10/10 Goal status: INITIAL  4.  Increase 30s chair stand test to 9 reps Baseline: 7 reps Goal status: INITIAL   PLAN:  PT FREQUENCY: 1-2x/week  PT DURATION: 6 weeks  PLANNED INTERVENTIONS: Therapeutic exercises, Therapeutic activity, Neuromuscular re-education, Balance training, Gait training, Patient/Family education, Self Care, Joint mobilization, DME instructions, Dry Needling, Cryotherapy, Moist heat, Manual therapy, and Re-evaluation.  PLAN FOR NEXT SESSION: HEP review and update, manual techniques as appropriate, aerobic tasks, ROM and flexibility activities, strengthening and PREs, TPDN, gait and balance training as needed     Fredderick Phenix, PT 04/27/2023, 7:41 AM

## 2023-05-03 ENCOUNTER — Encounter: Payer: Self-pay | Admitting: Physical Therapy

## 2023-05-03 ENCOUNTER — Ambulatory Visit: Payer: Commercial Managed Care - HMO | Admitting: Physical Therapy

## 2023-05-03 DIAGNOSIS — M439 Deforming dorsopathy, unspecified: Secondary | ICD-10-CM

## 2023-05-03 DIAGNOSIS — M6281 Muscle weakness (generalized): Secondary | ICD-10-CM

## 2023-05-03 DIAGNOSIS — M546 Pain in thoracic spine: Secondary | ICD-10-CM

## 2023-05-03 NOTE — Therapy (Signed)
Daily Note   Patient Name: Stacey Kaiser MRN: [id number] DOB:1971/05/16, 52 y.o., female Today's Date: 05/04/2023  END OF SESSION:  PT End of Session - 05/03/23 1400     Visit Number 5    Number of Visits 6    Date for PT Re-Evaluation 05/30/23    Authorization Type [employer]    PT Start Time 1400    PT Stop Time 1441    PT Time Calculation (min) 41 min    Activity Tolerance Patient limited by pain    Behavior During Therapy [facility] for tasks assessed/performed                Past Medical History:  Diagnosis Date   Back pain    Depression    Diskitis 01/17/2021   Elevated serum creatinine 03/24/2021   Family history of adverse reaction to anesthesia    " my mother had a seizure coming out of cabg surgery "   Hypertension    Joint pain    Morbid obesity (HCC)    Morbid obesity with BMI of 50.0-59.9, adult (HCC) 01/17/2021   Pre-diabetes    Scoliosis    Sleep apnea    SOB (shortness of breath)    Swelling of both lower extremities    Past Surgical History:  Procedure Laterality Date   IR FLUORO GUIDED NEEDLE PLC ASPIRATION/INJECTION LOC  01/18/2021   RADIOLOGY WITH ANESTHESIA N/A 01/18/2021   Procedure: IR WITH ANESTHESIA;  Surgeon: Radiologist, Medication, MD;  Location: [facility] OR;  Service: Radiology;  Laterality: N/A;   WISDOM TOOTH EXTRACTION     Patient Active Problem List   Diagnosis Date Noted   Left Leg cramp 03/20/2023   Depression 03/20/2023   BMI 60.0-69.9, adult (HCC) 03/20/2023   Prediabetes 01/15/2023   Other hyperlipidemia 01/15/2023   Hyperglycemia 01/01/2023   Vitamin D deficiency 01/01/2023   OSA (obstructive sleep apnea) 01/01/2023   Other fatigue 01/01/2023   SOBOE (shortness of breath on exertion) 01/01/2023   Depression screening 01/01/2023   Obesity, Beginning BMI 66.1 01/01/2023   Situational depression 04/12/2022   Snoring 12/13/2021   Hypertension 09/13/2021   Osteomyelitis of thoracic vertebra (HCC) 01/17/2021    PCP:  Storm Frisk, MD   REFERRING PROVIDER: Floreen Comber, NP  REFERRING DIAG: M54.6 (ICD-10-CM) - Pain in thoracic spine  Rationale for Evaluation and Treatment: Rehabilitation  THERAPY DIAG:  Pain in thoracic spine  Postural deformity  Muscle weakness (generalized)  ONSET DATE: chronic  SUBJECTIVE:  SUBJECTIVE STATEMENT: Pt reports that there is no significant change in her pain.   PERTINENT HISTORY:    PAIN:  Are you having pain? Yes: NPRS scale: 5/10 Pain location: mid back Pain description: ache Aggravating factors: activity in general Relieving factors: position changes  PRECAUTIONS: Other: thoracic diskitis /osteomyelitis  WEIGHT BEARING RESTRICTIONS: No  FALLS:  Has patient fallen in last 6 months? 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 Persistent but decreased degree of epidural phlegmon resulting in at a lesser degree of spinal canal narrowing, now mild-moderate.   Unchanged 50% height loss of T8 and 75% height loss of T9. Unchanged resultant focal kyphosis at T8-T9 with kinking and mild flattening of the cord. No abnormal cord signal.   Endplate changes at T7-T8 are similar to prior exam, and consistent with Modic degenerative change with possible superimposed infectious process given mild edema and enhancement at this level.     Electronically Signed   By: Caprice Renshaw   On: 03/31/2021 13:15  PATIENT SURVEYS:  FOTO 40 (53 predicted)  SCREENING FOR RED FLAGS: Bowel or bladder incontinence: No  MUSCLE  LENGTH: Hamstrings: Right 80 deg; Left 80 deg   POSTURE: rounded shoulders and increased thoracic kyphosis  PALPATION: deferred  LUMBAR ROM: Deferred due to pain  AROM eval  Flexion   Extension   Right lateral flexion   Left lateral flexion   Right rotation   Left rotation    (Blank rows = not tested)  LOWER EXTREMITY ROM:   WFL for gait, transfers and mobility needs  Passive  Right eval Left eval  Hip flexion    Hip extension    Hip abduction    Hip adduction    Hip internal rotation    Hip external rotation    Knee flexion    Knee extension    Ankle dorsiflexion    Ankle plantarflexion    Ankle inversion    Ankle eversion     (Blank rows = not tested)  LOWER EXTREMITY MMT:    MMT Right eval Left eval  Hip flexion 4- 4-  Hip extension 4- 4-  Hip abduction 4- 4-  Hip adduction    Hip internal rotation    Hip external rotation    Knee flexion 4- 4-  Knee extension 4- 4-  Ankle dorsiflexion    Ankle plantarflexion 4- 4-  Ankle inversion    Ankle eversion     (Blank rows = not tested)  LUMBAR SPECIAL TESTS:  Straight leg raise test: Negative and Slump test: Negative  FUNCTIONAL TESTS:  30 seconds chair stand test 7 reps  GAIT: Distance walked: 21ftx2 Assistive device utilized: Walker - 2 wheeled Level of assistance: Complete Independence Comments: slow cadence  TODAY'S TREATMENT:  DATE:   TREATMENT 05/03/23:  Aquatic therapy at [facility]- [address] - therapeutic pool temp 92 degrees Pt enters building independently.  Treatment took place in water 3.8 to  4 ft 8 in.feet deep depending upon activity.  Pt entered and exited the pool via stair and handrails    Aquatic Therapy:  Water walking for warm up fwd/lat/bkwds  Standing: Gentle noodle rotations  Blue noodle push down Ai chi 1-6 Standing march Hip  abd/add 2x20 BIL Heel raises - 2 x20 Squats x20 Step ups on submerged step 2x10 BIL Sit to stand - 2x10   Gait/LE: Lateral walking with dumbell add/abd    Pt requires the buoyancy of water for active assisted exercises with buoyancy supported for strengthening and AROM exercises. Hydrostatic pressure also supports joints by unweighting joint load by at least 50 % in 3-4 feet depth water. 80% in chest to neck deep water. Water will provide assistance with movement using the current and laminar flow while the buoyancy reduces weight bearing. Pt requires the viscosity of the water for resistance with strengthening exercises.    HOME EXERCISE PROGRAM: Access Code: [email or web address] URL: [email or web address] Date: 04/04/2023 Prepared by: Gustavus Bryant  Exercises - Supine March  - 2 x daily - 5 x weekly - 1 sets - 10 reps - Seated Long Arc Quad  - 2 x daily - 5 x weekly - 1 sets - 10 reps - Standing Heel Raise with Support  - 2 x daily - 5 x weekly - 1 sets - 10 reps  ASSESSMENT:  CLINICAL IMPRESSION: Session today focused on gentle thoracic mobility and general activity tolerance in the aquatic environment for use of buoyancy to offload joints and the viscosity of water as resistance during therapeutic exercise.  Pt with continued high levels of baseline pain which limit activities at home.  Progression to higher level activities is difficult given high levels of pain with shoulder and thoracic movement.  Patient was able to tolerate all prescribed exercises in the aquatic environment with no adverse effects and reports 6/10 pain at the end of the session. Patient continues to benefit from skilled PT services on land and aquatic based and should be progressed as able to improve functional independence.    OBJECTIVE IMPAIRMENTS: Abnormal gait, decreased activity tolerance, decreased endurance, decreased knowledge of condition, decreased mobility, difficulty walking, decreased ROM,  decreased strength, impaired perceived functional ability, improper body mechanics, postural dysfunction, obesity, and pain.   ACTIVITY LIMITATIONS: carrying, lifting, bending, sitting, standing, squatting, sleeping, stairs, transfers, and bed mobility  PERSONAL FACTORS: Age, Fitness, Time since onset of injury/illness/exacerbation, and 1 comorbidity: osteomyelitis  are also affecting patient's functional outcome.   REHAB POTENTIAL: Fair based on imaging studies  CLINICAL DECISION MAKING: Evolving/moderate complexity  EVALUATION COMPLEXITY: Low   GOALS: Goals reviewed with patient? No  SHORT TERM GOALS=LONG TERM GOALS: Target date: 05/16/2023  Patient to demonstrate independence in HEP  Baseline: [email or web address] Goal status: INITIAL  2.  Increase FOTO score to 53 Baseline: 40 Goal status: INITIAL  3.  Decrease worst pain to 8/10 Baseline: 10/10 Goal status: INITIAL  4.  Increase 30s chair stand test to 9 reps Baseline: 7 reps Goal status: INITIAL   PLAN:  PT FREQUENCY: 1-2x/week  PT DURATION: 6 weeks  PLANNED INTERVENTIONS: Therapeutic exercises, Therapeutic activity, Neuromuscular re-education, Balance training, Gait training, Patient/Family education, Self Care, Joint mobilization, DME instructions, Dry Needling, Cryotherapy, Moist heat, Manual therapy, and Re-evaluation.  PLAN FOR NEXT SESSION: HEP review and  update, manual techniques as appropriate, aerobic tasks, ROM and flexibility activities, strengthening and PREs, TPDN, gait and balance training as needed     Fredderick Phenix, PT 05/04/2023, 7:10 AM

## 2023-05-08 ENCOUNTER — Ambulatory Visit (INDEPENDENT_AMBULATORY_CARE_PROVIDER_SITE_OTHER): Payer: Commercial Managed Care - HMO | Admitting: Family Medicine

## 2023-05-08 ENCOUNTER — Encounter (INDEPENDENT_AMBULATORY_CARE_PROVIDER_SITE_OTHER): Payer: Self-pay | Admitting: Family Medicine

## 2023-05-08 VITALS — BP 134/78 | HR 77 | Temp 98.0°F | Ht <= 58 in | Wt 288.0 lb

## 2023-05-08 DIAGNOSIS — E559 Vitamin D deficiency, unspecified: Secondary | ICD-10-CM | POA: Diagnosis not present

## 2023-05-08 DIAGNOSIS — R7303 Prediabetes: Secondary | ICD-10-CM

## 2023-05-08 DIAGNOSIS — Z6841 Body Mass Index (BMI) 40.0 and over, adult: Secondary | ICD-10-CM

## 2023-05-08 DIAGNOSIS — E669 Obesity, unspecified: Secondary | ICD-10-CM

## 2023-05-08 MED ORDER — METFORMIN HCL 500 MG PO TABS
500.0000 mg | ORAL_TABLET | Freq: Every day | ORAL | 0 refills | Status: DC
Start: 2023-05-08 — End: 2023-06-12

## 2023-05-08 NOTE — Progress Notes (Signed)
Chief Complaint:   OBESITY Stacey Kaiser is here to discuss her progress with her obesity treatment plan along with follow-up of her obesity related diagnoses. Stacey Kaiser is on the Category 2 Plan and states she is following her eating plan approximately 50-60% of the time. Stacey Kaiser states she is walking for 10 minutes 7 times per week.  Today's visit was #: 8 Starting weight: 305 lbs Starting date: 01/01/2023 Today's weight: 288 lbs Today's date: 05/08/2023 Total lbs lost to date: 17 Total lbs lost since last in-office visit: 3  Interim History: Patient has been doing better with meeting her protein goals. She has been doing water physical therapy and feels her activity has improved.   Subjective:   1. Prediabetes Patient is working on increasing her protein and decreasing carbohydrates. No side effects were noted on metformin.   2. Vitamin D deficiency Patient is on Vitamin D, and her last level was not yet at goal.   Assessment/Plan:   1. Prediabetes We will refill metformin for 1 month, and we will recheck labs in 1 month.   - metFORMIN (GLUCOPHAGE) 500 MG tablet; Take 1 tablet (500 mg total) by mouth daily with breakfast.  Dispense: 30 tablet; Refill: 0  2. Vitamin D deficiency Patient will continue prescription Vitamin D 50,000 IU once weekly, and we will recheck labs in 1 month.   3. BMI 60.0-69.9, adult (HCC)  4. Obesity, Beginning BMI 65.98 Stacey Kaiser is currently in the action stage of change. As such, her goal is to continue with weight loss efforts. She has agreed to the Category 2 Plan or keeping a food journal and adhering to recommended goals of 1200-1400 calories and 90 grams of protein daily.   Exercise goals: As is.   Behavioral modification strategies: increasing lean protein intake.  Stacey Kaiser has agreed to follow-up with our clinic in 4 weeks. She was informed of the importance of frequent follow-up visits to maximize her success with intensive lifestyle  modifications for her multiple health conditions.   Objective:   Blood pressure 134/78, pulse 77, temperature 98 F (36.7 C), height 4\' 9"  (1.448 m), weight 288 lb (130.6 kg), SpO2 94%. Body mass index is 62.32 kg/m.  Lab Results  Component Value Date   CREATININE 0.89 01/01/2023   BUN 7 01/01/2023   NA 140 01/01/2023   K 4.1 01/01/2023   CL 101 01/01/2023   CO2 20 01/01/2023   Lab Results  Component Value Date   ALT 27 01/01/2023   AST 30 01/01/2023   ALKPHOS 99 01/01/2023   BILITOT 0.3 01/01/2023   Lab Results  Component Value Date   HGBA1C 5.9 (H) 01/01/2023   HGBA1C 5.9 (H) 12/16/2021   HGBA1C 5.7 (H) 01/17/2021   Lab Results  Component Value Date   INSULIN 20.1 01/01/2023   Lab Results  Component Value Date   TSH 4.190 01/01/2023   Lab Results  Component Value Date   CHOL 191 01/01/2023   HDL 41 01/01/2023   LDLCALC 129 (H) 01/01/2023   TRIG 116 01/01/2023   Lab Results  Component Value Date   VD25OH 37.8 01/01/2023   VD25OH 36 03/24/2021   Lab Results  Component Value Date   WBC 6.3 01/01/2023   HGB 13.5 01/01/2023   HCT 41.1 01/01/2023   MCV 91 01/01/2023   PLT 356 01/01/2023   No results found for: "IRON", "TIBC", "FERRITIN"  Attestation Statements:   Reviewed by clinician on day of visit: allergies, medications, problem list,  medical history, surgical history, family history, social history, and previous encounter notes.   I, Burt Knack, am acting as transcriptionist for Quillian Quince, MD.  I have reviewed the above documentation for accuracy and completeness, and I agree with the above. -  Quillian Quince, MD

## 2023-05-10 ENCOUNTER — Ambulatory Visit: Payer: Commercial Managed Care - HMO | Admitting: Physical Therapy

## 2023-05-10 ENCOUNTER — Encounter: Payer: Self-pay | Admitting: Physical Therapy

## 2023-05-10 DIAGNOSIS — M546 Pain in thoracic spine: Secondary | ICD-10-CM | POA: Diagnosis not present

## 2023-05-10 DIAGNOSIS — M6281 Muscle weakness (generalized): Secondary | ICD-10-CM

## 2023-05-10 DIAGNOSIS — M439 Deforming dorsopathy, unspecified: Secondary | ICD-10-CM

## 2023-05-10 NOTE — Therapy (Signed)
Daily Note   Patient Name: AMMIE WARRICK MRN: [id number] DOB:10-29-1970, 52 y.o., female Today's Date: 05/10/2023  END OF SESSION:  PT End of Session - 05/10/23 1514     Visit Number 6    Number of Visits 6    Date for PT Re-Evaluation 05/30/23    Authorization Type [employer]    PT Start Time 1515    PT Stop Time 1600    PT Time Calculation (min) 45 min    Activity Tolerance Patient limited by pain    Behavior During Therapy [facility] for tasks assessed/performed                 Past Medical History:  Diagnosis Date   Back pain    Depression    Diskitis 01/17/2021   Elevated serum creatinine 03/24/2021   Family history of adverse reaction to anesthesia    " my mother had a seizure coming out of cabg surgery "   Hypertension    Joint pain    Morbid obesity (HCC)    Morbid obesity with BMI of 50.0-59.9, adult (HCC) 01/17/2021   Pre-diabetes    Scoliosis    Sleep apnea    SOB (shortness of breath)    Swelling of both lower extremities    Past Surgical History:  Procedure Laterality Date   IR FLUORO GUIDED NEEDLE PLC ASPIRATION/INJECTION LOC  01/18/2021   RADIOLOGY WITH ANESTHESIA N/A 01/18/2021   Procedure: IR WITH ANESTHESIA;  Surgeon: Radiologist, Medication, MD;  Location: [facility] OR;  Service: Radiology;  Laterality: N/A;   WISDOM TOOTH EXTRACTION     Patient Active Problem List   Diagnosis Date Noted   Left Leg cramp 03/20/2023   Depression 03/20/2023   BMI 60.0-69.9, adult (HCC) 03/20/2023   Prediabetes 01/15/2023   Other hyperlipidemia 01/15/2023   Hyperglycemia 01/01/2023   Vitamin D deficiency 01/01/2023   OSA (obstructive sleep apnea) 01/01/2023   Other fatigue 01/01/2023   SOBOE (shortness of breath on exertion) 01/01/2023   Depression screening 01/01/2023   Obesity, Beginning BMI 66.1 01/01/2023   Situational depression 04/12/2022   Snoring 12/13/2021   Hypertension 09/13/2021   Osteomyelitis of thoracic vertebra (HCC) 01/17/2021    PCP:  Storm Frisk, MD   REFERRING PROVIDER: Floreen Comber, NP  REFERRING DIAG: M54.6 (ICD-10-CM) - Pain in thoracic spine  Rationale for Evaluation and Treatment: Rehabilitation  THERAPY DIAG:  Pain in thoracic spine  Postural deformity  Muscle weakness (generalized)  ONSET DATE: chronic  SUBJECTIVE:  SUBJECTIVE STATEMENT: Pt reports that her joints are little more painful d/t the rainy weather.  PERTINENT HISTORY:    PAIN:  Are you having pain? Yes: NPRS scale: 5/10 Pain location: mid back Pain description: ache Aggravating factors: activity in general Relieving factors: position changes  PRECAUTIONS: Other: thoracic diskitis /osteomyelitis  WEIGHT BEARING RESTRICTIONS: No  FALLS:  Has patient fallen in last 6 months? 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 Persistent but decreased degree of epidural phlegmon resulting in at a lesser degree of spinal canal narrowing, now mild-moderate.   Unchanged 50% height loss of T8 and 75% height loss of T9. Unchanged resultant focal kyphosis at T8-T9 with kinking and mild flattening of the cord. No abnormal cord signal.   Endplate changes at T7-T8 are similar to prior exam, and consistent with Modic degenerative change with possible superimposed infectious process given mild edema and enhancement at this level.     Electronically Signed   By: Caprice Renshaw   On: 03/31/2021 13:15  PATIENT SURVEYS:  FOTO 40 (53 predicted)  SCREENING FOR RED FLAGS: Bowel or bladder incontinence:  No  MUSCLE LENGTH: Hamstrings: Right 80 deg; Left 80 deg   POSTURE: rounded shoulders and increased thoracic kyphosis  PALPATION: deferred  LUMBAR ROM: Deferred due to pain  AROM eval  Flexion   Extension   Right lateral flexion   Left lateral flexion   Right rotation   Left rotation    (Blank rows = not tested)  LOWER EXTREMITY ROM:   WFL for gait, transfers and mobility needs  Passive  Right eval Left eval  Hip flexion    Hip extension    Hip abduction    Hip adduction    Hip internal rotation    Hip external rotation    Knee flexion    Knee extension    Ankle dorsiflexion    Ankle plantarflexion    Ankle inversion    Ankle eversion     (Blank rows = not tested)  LOWER EXTREMITY MMT:    MMT Right eval Left eval  Hip flexion 4- 4-  Hip extension 4- 4-  Hip abduction 4- 4-  Hip adduction    Hip internal rotation    Hip external rotation    Knee flexion 4- 4-  Knee extension 4- 4-  Ankle dorsiflexion    Ankle plantarflexion 4- 4-  Ankle inversion    Ankle eversion     (Blank rows = not tested)  LUMBAR SPECIAL TESTS:  Straight leg raise test: Negative and Slump test: Negative  FUNCTIONAL TESTS:  30 seconds chair stand test 7 reps  GAIT: Distance walked: 38ftx2 Assistive device utilized: Walker - 2 wheeled Level of assistance: Complete Independence Comments: slow cadence  TODAY'S TREATMENT:  DATE:   TREATMENT 05/03/23:  Aquatic therapy at [facility]- [address] - therapeutic pool temp 92 degrees Pt enters building independently.  Treatment took place in water 3.8 to  4 ft 8 in.feet deep depending upon activity.  Pt entered and exited the pool via stair and handrails    Aquatic Therapy:  Water walking for warm up fwd/lat/bkwds  Standing: Gentle noodle rotations  Blue noodle push down Ai chi 1-6 Standing  march Hip abd/add 2x20 BIL Heel raises - 2 x20 Squats x20 Step ups on submerged step 2x10 BIL Sit to stand - 2x10   Gait/LE: Lateral walking with dumbell add/abd    Pt requires the buoyancy of water for active assisted exercises with buoyancy supported for strengthening and AROM exercises. Hydrostatic pressure also supports joints by unweighting joint load by at least 50 % in 3-4 feet depth water. 80% in chest to neck deep water. Water will provide assistance with movement using the current and laminar flow while the buoyancy reduces weight bearing. Pt requires the viscosity of the water for resistance with strengthening exercises.    HOME EXERCISE PROGRAM: Access Code: [email or web address] URL: [email or web address] Date: 04/04/2023 Prepared by: Gustavus Bryant  Exercises - Supine March  - 2 x daily - 5 x weekly - 1 sets - 10 reps - Seated Long Arc Quad  - 2 x daily - 5 x weekly - 1 sets - 10 reps - Standing Heel Raise with Support  - 2 x daily - 5 x weekly - 1 sets - 10 reps  ASSESSMENT:  CLINICAL IMPRESSION: Session today focused on gentle thoracic mobility and general activity tolerance in the aquatic environment for use of buoyancy to offload joints and the viscosity of water as resistance during therapeutic exercise.  Pt with continued high levels of pain which significantly limits her mobility and ability to complete basic daily tasks.  Aquatic therapy was minimally helpful in reducing her sxs.  Patient was able to tolerate all prescribed exercises in the aquatic environment with no adverse effects and reports 6/10 pain at the end of the session. Patient continues to benefit from skilled PT services on land and aquatic based and should be progressed as able to improve functional independence.    OBJECTIVE IMPAIRMENTS: Abnormal gait, decreased activity tolerance, decreased endurance, decreased knowledge of condition, decreased mobility, difficulty walking, decreased ROM,  decreased strength, impaired perceived functional ability, improper body mechanics, postural dysfunction, obesity, and pain.   ACTIVITY LIMITATIONS: carrying, lifting, bending, sitting, standing, squatting, sleeping, stairs, transfers, and bed mobility  PERSONAL FACTORS: Age, Fitness, Time since onset of injury/illness/exacerbation, and 1 comorbidity: osteomyelitis  are also affecting patient's functional outcome.   REHAB POTENTIAL: Fair based on imaging studies  CLINICAL DECISION MAKING: Evolving/moderate complexity  EVALUATION COMPLEXITY: Low   GOALS: Goals reviewed with patient? No  SHORT TERM GOALS=LONG TERM GOALS: Target date: 05/16/2023  Patient to demonstrate independence in HEP  Baseline: [id number] Goal status: INITIAL  2.  Increase FOTO score to 53 Baseline: 40 Goal status: INITIAL  3.  Decrease worst pain to 8/10 Baseline: 10/10 Goal status: INITIAL  4.  Increase 30s chair stand test to 9 reps Baseline: 7 reps Goal status: INITIAL   PLAN:  PT FREQUENCY: 1-2x/week  PT DURATION: 6 weeks  PLANNED INTERVENTIONS: Therapeutic exercises, Therapeutic activity, Neuromuscular re-education, Balance training, Gait training, Patient/Family education, Self Care, Joint mobilization, DME instructions, Dry Needling, Cryotherapy, Moist heat, Manual therapy, and Re-evaluation.  PLAN FOR NEXT SESSION: HEP review and update, manual  techniques as appropriate, aerobic tasks, ROM and flexibility activities, strengthening and PREs, TPDN, gait and balance training as needed     Fredderick Phenix, PT 05/10/2023, 3:57 PM

## 2023-05-15 NOTE — Therapy (Unsigned)
PHYSICAL THERAPY DISCHARGE SUMMARY   Patient Name: Stacey Kaiser MRN: [id number] DOB:April 24, 1971, 52 y.o., female Today's Date: 05/17/2023 PHYSICAL THERAPY DISCHARGE SUMMARY  Visits from Start of Care: 7  Current functional level related to goals / functional outcomes: Goal not met   Remaining deficits: Pain and weakness   Education / Equipment: HEP   Patient agrees to discharge. Patient goals were not met. Patient is being discharged due to did not respond to therapy.   END OF SESSION:  PT End of Session - 05/17/23 1133     Visit Number 7    Number of Visits 7    Date for PT Re-Evaluation 05/30/23    Authorization Type [employer]    PT Start Time 1130    Activity Tolerance Patient limited by pain    Behavior During Therapy [facility] for tasks assessed/performed                  Past Medical History:  Diagnosis Date   Back pain    Depression    Diskitis 01/17/2021   Elevated serum creatinine 03/24/2021   Family history of adverse reaction to anesthesia    " my mother had a seizure coming out of cabg surgery "   Hypertension    Joint pain    Morbid obesity (HCC)    Morbid obesity with BMI of 50.0-59.9, adult (HCC) 01/17/2021   Pre-diabetes    Scoliosis    Sleep apnea    SOB (shortness of breath)    Swelling of both lower extremities    Past Surgical History:  Procedure Laterality Date   IR FLUORO GUIDED NEEDLE PLC ASPIRATION/INJECTION LOC  01/18/2021   RADIOLOGY WITH ANESTHESIA N/A 01/18/2021   Procedure: IR WITH ANESTHESIA;  Surgeon: Radiologist, Medication, MD;  Location: [facility] OR;  Service: Radiology;  Laterality: N/A;   WISDOM TOOTH EXTRACTION     Patient Active Problem List   Diagnosis Date Noted   Left Leg cramp 03/20/2023   Depression 03/20/2023   BMI 60.0-69.9, adult (HCC) 03/20/2023   Prediabetes 01/15/2023   Other hyperlipidemia 01/15/2023   Hyperglycemia 01/01/2023   Vitamin D deficiency 01/01/2023   OSA (obstructive sleep apnea)  01/01/2023   Other fatigue 01/01/2023   SOBOE (shortness of breath on exertion) 01/01/2023   Depression screening 01/01/2023   Obesity, Beginning BMI 66.1 01/01/2023   Situational depression 04/12/2022   Snoring 12/13/2021   Hypertension 09/13/2021   Osteomyelitis of thoracic vertebra (HCC) 01/17/2021    PCP: Storm Frisk, MD   REFERRING PROVIDER: Floreen Comber, NP  REFERRING DIAG: M54.6 (ICD-10-CM) - Pain in thoracic spine  Rationale for Evaluation and Treatment: Rehabilitation  THERAPY DIAG:  Pain in thoracic spine  Postural deformity  Muscle weakness (generalized)  ONSET DATE: chronic  SUBJECTIVE:  SUBJECTIVE STATEMENT:  Overall pain unchanged since first visit.  Pain ranges from 2/10 upon waking, unable to tolerate pain by 2 PM needing to take meds to manage pain an unable tolerate activity after 8 PM and needing to lie down for relief.  Has been walking for activity/exercises but unable to tolerate long distance community ambulation.  PERTINENT HISTORY:    PAIN:  Are you having pain? Yes: NPRS scale: 2-"15"/10 Pain location: mid back Pain description: ache Aggravating factors: activity in general Relieving factors: position changes, lying supine  PRECAUTIONS: Other: thoracic diskitis /osteomyelitis  WEIGHT BEARING RESTRICTIONS: No  FALLS:  Has patient fallen in last 6 months? 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 Persistent but decreased degree of epidural phlegmon resulting in at a lesser degree of spinal canal  narrowing, now mild-moderate.   Unchanged 50% height loss of T8 and 75% height loss of T9. Unchanged resultant focal kyphosis at T8-T9 with kinking and mild flattening of the cord. No abnormal cord signal.   Endplate changes at T7-T8 are similar to prior exam, and consistent with Modic degenerative change with possible superimposed infectious process given mild edema and enhancement at this level.     Electronically Signed   By: Caprice Renshaw   On: 03/31/2021 13:15  PATIENT SURVEYS:  FOTO 40 (53 predicted) 05/17/23 38  SCREENING FOR RED FLAGS: Bowel or bladder incontinence: No  MUSCLE LENGTH: Hamstrings: Right 80 deg; Left 80 deg   POSTURE: rounded shoulders and increased thoracic kyphosis  PALPATION: deferred  LUMBAR ROM: Deferred due to pain  AROM eval  Flexion   Extension   Right lateral flexion   Left lateral flexion   Right rotation   Left rotation    (Blank rows = not tested)  LOWER EXTREMITY ROM:   WFL for gait, transfers and mobility needs  Passive  Right eval Left eval  Hip flexion    Hip extension    Hip abduction    Hip adduction    Hip internal rotation    Hip external rotation    Knee flexion    Knee extension    Ankle dorsiflexion    Ankle plantarflexion    Ankle inversion    Ankle eversion     (Blank rows = not tested)  LOWER EXTREMITY MMT:    MMT Right eval Left eval  Hip flexion 4- 4-  Hip extension 4- 4-  Hip abduction 4- 4-  Hip adduction    Hip internal rotation    Hip external rotation    Knee flexion 4- 4-  Knee extension 4- 4-  Ankle dorsiflexion    Ankle plantarflexion 4- 4-  Ankle inversion    Ankle eversion     (Blank rows = not tested)  LUMBAR SPECIAL TESTS:  Straight leg raise test: Negative and Slump test: Negative  FUNCTIONAL TESTS:  30 seconds chair stand test 7 reps; 05/17/23 7 reps w/o UE assist  GAIT: Distance walked: 60ftx2 Assistive device utilized: Walker - 2 wheeled Level of assistance: Complete  Independence Comments: slow cadence  TODAY'S TREATMENT:  [facility] Adult PT Treatment:                                                DATE: 05/17/23 Therapeutic Exercise: FAQs 10/10  Supine march 10/10 SLR 10/10 5x STS S/L abd L side only 10x SLR 6/7 due to pain    TREATMENT 05/03/23:  Aquatic therapy at [facility]- [address] - therapeutic pool temp 92 degrees Pt enters building independently.  Treatment took place in water 3.8 to  4 ft 8 in.feet deep depending upon activity.  Pt entered and exited the pool via stair and handrails    Aquatic Therapy:  Water walking for warm up fwd/lat/bkwds  Standing: Gentle noodle rotations  Blue noodle push down Ai chi 1-6 Standing march Hip abd/add 2x20 BIL Heel raises - 2 x20 Squats x20 Step ups on submerged step 2x10 BIL Sit to stand - 2x10   Gait/LE: Lateral walking with dumbell add/abd    Pt requires the buoyancy of water for active assisted exercises with buoyancy supported for strengthening and AROM exercises. Hydrostatic pressure also supports joints by unweighting joint load by at least 50 % in 3-4 feet depth water. 80% in chest to neck deep water. Water will provide assistance with movement using the current and laminar flow while the buoyancy reduces weight bearing. Pt requires the viscosity of the water for resistance with strengthening exercises.    HOME EXERCISE PROGRAM: Access Code: [email or web address] URL: [email or web address] Date: 04/04/2023 Prepared by: Gustavus Bryant  Exercises - Supine March  - 2 x daily - 5 x weekly - 1 sets - 10 reps - Seated Long Arc Quad  - 2 x daily - 5 x weekly - 1 sets - 10 reps - Standing Heel Raise with Support  - 2 x daily - 5 x weekly - 1 sets - 10 reps  ASSESSMENT:  CLINICAL IMPRESSION:  Unchanging pain levels of functional ability.  30s chair  stand results unchanged, FOTO score has declined, pain levels remain high.  Recommend DC to MD care for additional f/u.   OBJECTIVE IMPAIRMENTS: Abnormal gait, decreased activity tolerance, decreased endurance, decreased knowledge of condition, decreased mobility, difficulty walking, decreased ROM, decreased strength, impaired perceived functional ability, improper body mechanics, postural dysfunction, obesity, and pain.   ACTIVITY LIMITATIONS: carrying, lifting, bending, sitting, standing, squatting, sleeping, stairs, transfers, and bed mobility  PERSONAL FACTORS: Age, Fitness, Time since onset of injury/illness/exacerbation, and 1 comorbidity: osteomyelitis  are also affecting patient's functional outcome.   REHAB POTENTIAL: Fair based on imaging studies  CLINICAL DECISION MAKING: Evolving/moderate complexity  EVALUATION COMPLEXITY: Low   GOALS: Goals reviewed with patient? No  SHORT TERM GOALS=LONG TERM GOALS: Target date: 05/16/2023  Patient to demonstrate independence in HEP  Baseline: 9KBQLD3T Goal status: Met  2.  Increase FOTO score to 53 Baseline: 40; 05/17/23 38 Goal status: Not met  3.  Decrease worst pain to 8/10 Baseline: 10/10; 05/17/23 10+/10 at worst Goal status: Not met  4.  Increase 30s chair stand test to 9 reps Baseline: 7 reps; 05/17/23 7 reps Goal status: Not met   PLAN:  PT FREQUENCY: 1-2x/week  PT DURATION: 6 weeks  PLANNED INTERVENTIONS: Therapeutic exercises, Therapeutic activity, Neuromuscular re-education, Balance training, Gait training, Patient/Family education, Self Care, Joint mobilization, DME instructions, Dry Needling, Cryotherapy, Moist heat, Manual therapy, and Re-evaluation.  PLAN FOR NEXT SESSION: HEP  review and update, manual techniques as appropriate, aerobic tasks, ROM and flexibility activities, strengthening and PREs, TPDN, gait and balance training as needed     Hildred Laser, PT 05/17/2023, 12:12 PM

## 2023-05-17 ENCOUNTER — Ambulatory Visit: Payer: Commercial Managed Care - HMO | Attending: Student

## 2023-05-17 DIAGNOSIS — M439 Deforming dorsopathy, unspecified: Secondary | ICD-10-CM | POA: Diagnosis present

## 2023-05-17 DIAGNOSIS — M6281 Muscle weakness (generalized): Secondary | ICD-10-CM

## 2023-05-17 DIAGNOSIS — M546 Pain in thoracic spine: Secondary | ICD-10-CM

## 2023-05-25 ENCOUNTER — Other Ambulatory Visit: Payer: Self-pay | Admitting: Critical Care Medicine

## 2023-05-25 DIAGNOSIS — M4644 Discitis, unspecified, thoracic region: Secondary | ICD-10-CM

## 2023-06-12 ENCOUNTER — Encounter (INDEPENDENT_AMBULATORY_CARE_PROVIDER_SITE_OTHER): Payer: Self-pay | Admitting: Family Medicine

## 2023-06-12 ENCOUNTER — Ambulatory Visit (INDEPENDENT_AMBULATORY_CARE_PROVIDER_SITE_OTHER): Payer: Commercial Managed Care - HMO | Admitting: Family Medicine

## 2023-06-12 VITALS — BP 139/76 | HR 88 | Temp 97.9°F | Ht <= 58 in | Wt 292.0 lb

## 2023-06-12 DIAGNOSIS — E669 Obesity, unspecified: Secondary | ICD-10-CM

## 2023-06-12 DIAGNOSIS — R600 Localized edema: Secondary | ICD-10-CM

## 2023-06-12 DIAGNOSIS — Z6841 Body Mass Index (BMI) 40.0 and over, adult: Secondary | ICD-10-CM

## 2023-06-12 DIAGNOSIS — R400 Somnolence: Secondary | ICD-10-CM

## 2023-06-12 DIAGNOSIS — R0609 Other forms of dyspnea: Secondary | ICD-10-CM | POA: Diagnosis not present

## 2023-06-12 DIAGNOSIS — E559 Vitamin D deficiency, unspecified: Secondary | ICD-10-CM | POA: Diagnosis not present

## 2023-06-12 DIAGNOSIS — R7303 Prediabetes: Secondary | ICD-10-CM

## 2023-06-12 DIAGNOSIS — I10 Essential (primary) hypertension: Secondary | ICD-10-CM

## 2023-06-12 MED ORDER — METFORMIN HCL 500 MG PO TABS
500.0000 mg | ORAL_TABLET | Freq: Every day | ORAL | 0 refills | Status: DC
Start: 2023-06-12 — End: 2023-07-12

## 2023-06-12 MED ORDER — VITAMIN D (ERGOCALCIFEROL) 1.25 MG (50000 UNIT) PO CAPS
50000.0000 [IU] | ORAL_CAPSULE | ORAL | 0 refills | Status: DC
Start: 2023-06-12 — End: 2023-07-12

## 2023-06-12 NOTE — Progress Notes (Signed)
.smr  Office: [phone]  /  Fax: [phone]  WEIGHT SUMMARY AND BIOMETRICS  Anthropometric Measurements Height: 4\' 9"  (1.448 m) Weight: 292 lb (132.5 kg) BMI (Calculated): 63.17 Weight at Last Visit: 288 lb Weight Lost Since Last Visit: 0 Weight Gained Since Last Visit: 4 lb Starting Weight: 305 lb Total Weight Loss (lbs): 13 lb (5.897 kg)   Body Composition  Body Fat %: 64.3 % Fat Mass (lbs): 188 lbs Muscle Mass (lbs): 99.2 lbs Visceral Fat Rating : 30   Other Clinical Data Fasting: Yes Labs: Yes Today's Visit #: 6 Starting Date: 01/01/23    Chief Complaint: OBESITY   Discussed the use of AI scribe software for clinical note transcription with the patient, who gave verbal consent to proceed.  History of Present Illness   The patient, a 52 year old individual with obesity, prediabetes, vitamin D deficiency, and hypertension, presents today with a recent weight gain of four pounds over the past four weeks. She reports adherence to the category two plan approximately 60% of the time and has been increasing their walking to ten minutes daily.  The patient has been experiencing significant fatigue and daytime sleepiness for the past couple of weeks, to the point of struggling to keep their eyes open. Despite this, she reports no changes in their sleep patterns. She is currently on blood pressure medication, which she has been taking for a while. She recalls that when she first started taking the medication, it caused a rapid drop in their blood pressure, leading to a similar feeling of fatigue.  In addition to fatigue, the patient has noticed increased swelling, particularly in their fingers, over the past couple of weeks. She is currently taking Lasix daily. She also reports shortness of breath with exercise, such as walking from the parking deck to the pool for aqua therapy.  The patient has a history of mild sleep apnea, diagnosed approximately a year ago, and was  advised that weight loss could improve their condition. She also has a family history of congestive heart failure. She is not using a CPAP.  Regarding their diet, the patient struggles with sweet cravings in the afternoon and has difficulty meeting their protein intake target. She is currently on metformin and vitamin D supplements, with no reported issues. She is not meeting her protein goals which is stalling her weight loss efforts.          PHYSICAL EXAM:  Blood pressure 139/76, pulse 88, temperature 97.9 F (36.6 C), height 4\' 9"  (1.448 m), weight 292 lb (132.5 kg), SpO2 96%. Body mass index is 63.19 kg/m.  DIAGNOSTIC DATA REVIEWED:  BMET    Component Value Date/Time   NA 140 01/01/2023 0928   K 4.1 01/01/2023 0928   CL 101 01/01/2023 0928   CO2 20 01/01/2023 0928   GLUCOSE 115 (H) 01/01/2023 0928   GLUCOSE 109 (H) 05/03/2021 0922   BUN 7 01/01/2023 0928   CREATININE 0.89 01/01/2023 0928   CREATININE 1.07 (H) 05/03/2021 0922   CALCIUM 9.8 01/01/2023 0928   GFRNONAA >60 02/28/2021 1721   Lab Results  Component Value Date   HGBA1C 5.9 (H) 01/01/2023   HGBA1C 5.7 (H) 01/17/2021   Lab Results  Component Value Date   INSULIN 20.1 01/01/2023   Lab Results  Component Value Date   TSH 4.190 01/01/2023   CBC    Component Value Date/Time   WBC 6.3 01/01/2023 0928   WBC 8.0 03/24/2021 0933   RBC 4.51 01/01/2023 0928  RBC 4.79 03/24/2021 0933   HGB 13.5 01/01/2023 0928   HCT 41.1 01/01/2023 0928   PLT 356 01/01/2023 0928   MCV 91 01/01/2023 0928   MCH 29.9 01/01/2023 0928   MCH 29.4 03/24/2021 0933   MCHC 32.8 01/01/2023 0928   MCHC 33.6 03/24/2021 0933   RDW 13.0 01/01/2023 0928   Iron Studies No results found for: "IRON", "TIBC", "FERRITIN", "IRONPCTSAT" Lipid Panel     Component Value Date/Time   CHOL 191 01/01/2023 0928   TRIG 116 01/01/2023 0928   HDL 41 01/01/2023 0928   LDLCALC 129 (H) 01/01/2023 0928   Hepatic Function Panel     Component  Value Date/Time   PROT 6.8 01/01/2023 0928   ALBUMIN 4.1 01/01/2023 0928   AST 30 01/01/2023 0928   ALT 27 01/01/2023 0928   ALKPHOS 99 01/01/2023 0928   BILITOT 0.3 01/01/2023 0928      Component Value Date/Time   TSH 4.190 01/01/2023 0928   Nutritional Lab Results  Component Value Date   VD25OH 37.8 01/01/2023   VD25OH 36 03/24/2021     Assessment and Plan    Obesity Weight gain of 4 pounds over the past 4 weeks. Difficulty adhering to dietary plan and achieving protein intake goals. -Continue category two dietary plan, aiming for 1200-1400 calories and 90g of protein daily. -Consider incorporating more protein drinks into diet for easier protein intake. -Continue daily walking for 10 minutes.  Prediabetes No new symptoms reported. -Continue Metformin. -Check A1C and insulin levels in today's labs.  Vitamin D deficiency No new symptoms reported. -Check Vitamin D level in today's labs. -Refill Vitamin D prescription.  Hypertension No new symptoms reported. -Continue current blood pressure medication.  Daytime Sleepiness New symptom of excessive daytime sleepiness over the past couple of weeks. -Check thyroid, iron, B12, and complete blood count in today's labs. -Consider follow-up with sleep specialist if symptoms persist.  Edema New symptom of increased swelling in fingers and feeling of fluid retention. -Check cardiac markers in today's labs. -Continue daily Lasix.  Dyspnea on exertion -Seems to have slightly worsened in the last month. BNP ordered and echo reviewed from 2022 (WNL), will follow up in 4 weeks, pt to My[facility] me if symptoms worsen.  Follow-up in 4 weeks.       She was informed of the importance of frequent follow up visits to maximize her success with intensive lifestyle modifications for her multiple health conditions.    Quillian Quince, MD

## 2023-06-13 LAB — CMP14+EGFR
ALT: 18 IU/L (ref 0–32)
AST: 17 IU/L (ref 0–40)
Albumin: 3.9 g/dL (ref 3.8–4.9)
Alkaline Phosphatase: 94 IU/L (ref 44–121)
BUN/Creatinine Ratio: 20 (ref 9–23)
BUN: 16 mg/dL (ref 6–24)
Bilirubin Total: 0.2 mg/dL (ref 0.0–1.2)
CO2: 23 mmol/L (ref 20–29)
Calcium: 9 mg/dL (ref 8.7–10.2)
Chloride: 107 mmol/L — ABNORMAL HIGH (ref 96–106)
Creatinine, Ser: 0.8 mg/dL (ref 0.57–1.00)
Globulin, Total: 2.6 g/dL (ref 1.5–4.5)
Glucose: 112 mg/dL — ABNORMAL HIGH (ref 70–99)
Potassium: 4.2 mmol/L (ref 3.5–5.2)
Sodium: 143 mmol/L (ref 134–144)
Total Protein: 6.5 g/dL (ref 6.0–8.5)
eGFR: 89 mL/min/{1.73_m2} (ref >59)

## 2023-06-13 LAB — HEMOGLOBIN A1C
Est. average glucose Bld gHb Est-mCnc: 123 mg/dL
Hgb A1c MFr Bld: 5.9 % — ABNORMAL HIGH (ref 4.8–5.6)

## 2023-06-13 LAB — LIPID PANEL WITH LDL/HDL RATIO
Cholesterol, Total: 173 mg/dL (ref 100–199)
HDL: 38 mg/dL — ABNORMAL LOW (ref >39)
LDL Chol Calc (NIH): 118 mg/dL — ABNORMAL HIGH (ref 0–99)
LDL/HDL Ratio: 3.1 ratio (ref 0.0–3.2)
Triglycerides: 91 mg/dL (ref 0–149)
VLDL Cholesterol Cal: 17 mg/dL (ref 5–40)

## 2023-06-13 LAB — TSH: TSH: 6.15 u[IU]/mL — ABNORMAL HIGH (ref 0.450–4.500)

## 2023-06-13 LAB — BRAIN NATRIURETIC PEPTIDE: BNP: 40.9 pg/mL (ref 0.0–100.0)

## 2023-06-13 LAB — INSULIN, RANDOM: INSULIN: 23.5 u[IU]/mL (ref 2.6–24.9)

## 2023-06-13 LAB — VITAMIN B12: Vitamin B-12: >2000 pg/mL — ABNORMAL HIGH (ref 232–1245)

## 2023-06-13 LAB — VITAMIN D 25 HYDROXY (VIT D DEFICIENCY, FRACTURES): Vit D, 25-Hydroxy: 56.1 ng/mL (ref 30.0–100.0)

## 2023-06-27 ENCOUNTER — Other Ambulatory Visit: Payer: Self-pay | Admitting: Critical Care Medicine

## 2023-06-27 DIAGNOSIS — M4644 Discitis, unspecified, thoracic region: Secondary | ICD-10-CM

## 2023-07-03 ENCOUNTER — Ambulatory Visit: Payer: Commercial Managed Care - HMO | Admitting: Critical Care Medicine

## 2023-07-12 ENCOUNTER — Ambulatory Visit (INDEPENDENT_AMBULATORY_CARE_PROVIDER_SITE_OTHER): Payer: Managed Care, Other (non HMO) | Admitting: Family Medicine

## 2023-07-12 ENCOUNTER — Encounter (INDEPENDENT_AMBULATORY_CARE_PROVIDER_SITE_OTHER): Payer: Self-pay | Admitting: Family Medicine

## 2023-07-12 VITALS — BP 114/77 | HR 70 | Temp 97.8°F | Ht <= 58 in | Wt 288.0 lb

## 2023-07-12 DIAGNOSIS — Z6841 Body Mass Index (BMI) 40.0 and over, adult: Secondary | ICD-10-CM

## 2023-07-12 DIAGNOSIS — M25562 Pain in left knee: Secondary | ICD-10-CM | POA: Diagnosis not present

## 2023-07-12 DIAGNOSIS — R7303 Prediabetes: Secondary | ICD-10-CM | POA: Diagnosis not present

## 2023-07-12 DIAGNOSIS — G8929 Other chronic pain: Secondary | ICD-10-CM

## 2023-07-12 DIAGNOSIS — E559 Vitamin D deficiency, unspecified: Secondary | ICD-10-CM | POA: Diagnosis not present

## 2023-07-12 DIAGNOSIS — E669 Obesity, unspecified: Secondary | ICD-10-CM

## 2023-07-12 DIAGNOSIS — M25561 Pain in right knee: Secondary | ICD-10-CM

## 2023-07-12 MED ORDER — VITAMIN D (ERGOCALCIFEROL) 1.25 MG (50000 UNIT) PO CAPS
50000.0000 [IU] | ORAL_CAPSULE | ORAL | 0 refills | Status: DC
Start: 2023-07-12 — End: 2023-08-02

## 2023-07-12 MED ORDER — METFORMIN HCL 500 MG PO TABS
500.0000 mg | ORAL_TABLET | Freq: Every day | ORAL | 0 refills | Status: DC
Start: 2023-07-12 — End: 2023-08-02

## 2023-07-12 NOTE — Progress Notes (Signed)
Chief Complaint:   OBESITY Stacey Kaiser is here to discuss her progress with her obesity treatment plan along with follow-up of her obesity related diagnoses. Stacey Kaiser is on the Category 2 Plan and keeping a food journal and adhering to recommended goals of 1200-1400 calories and 90 grams of protein and states she is following her eating plan approximately 60-65% of the time. Stacey Kaiser states she is walking for 10 minutes 7 times per week.  Today's visit was #: 10 Starting weight: 305 lbs Starting date: 01/01/2023 Today's weight: 288 lbs Today's date: 07/12/2023 Total lbs lost to date: 17 Total lbs lost since last in-office visit: 4  Interim History: Patient continues to work on her weight loss.  She struggles to eat all of the food on her plan.  Subjective:   1. Prediabetes Patient is working on her diet and weight loss.  Her last A1c was unchanged.  I discussed labs with the patient today.  2. Vitamin D deficiency Patient is stable on vitamin D, and her vitamin D level was not at goal.  I discussed labs with the patient today.  3. Chronic pain of both knees Patient notes increased knee pain.  She is working on walking but she is limited to how much she can walk due to the pain.  Assessment/Plan:   1. Prediabetes Patient will continue metformin 500 mg every morning, and we will refill for 1 month.  - metFORMIN (GLUCOPHAGE) 500 MG tablet; Take 1 tablet (500 mg total) by mouth daily with breakfast.  Dispense: 30 tablet; Refill: 0  2. Vitamin D deficiency Patient will continue prescription vitamin D 50,000 IU once weekly, and we will refill for 1 month.  - Vitamin D, Ergocalciferol, (DRISDOL) 1.25 MG (50000 UNIT) CAPS capsule; Take 1 capsule (50,000 Units total) by mouth every 7 (seven) days.  Dispense: 5 capsule; Refill: 0  3. Chronic pain of both knees Patient is to discuss this with her Ortho doctor at her upcoming visit.  She will continue walking 10 minutes each day, and will  continue to work on weight loss to help decrease the pain.  4. BMI 60.0-69.9, adult (HCC)  5. Obesity, Beginning BMI 65.98 Stacey Kaiser is currently in the action stage of change. As such, her goal is to continue with weight loss efforts. She has agreed to the Category 2 Plan or keeping a food journal and adhering to recommended goals of 1200-1400 calories and 90+ grams of protein daily.   Exercise goals: As is.   Behavioral modification strategies: increasing lean protein intake and no skipping meals.  [name] has agreed to follow-up with our clinic in 2 to 3 weeks. She was informed of the importance of frequent follow-up visits to maximize her success with intensive lifestyle modifications for her multiple health conditions.   Objective:   Blood pressure 114/77, pulse 70, temperature 97.8 F (36.6 C), height 4\' 9"  (1.448 m), weight 288 lb (130.6 kg), SpO2 97%. Body mass index is 62.32 kg/m.  Lab Results  Component Value Date   CREATININE 0.80 06/12/2023   BUN 16 06/12/2023   NA 143 06/12/2023   K 4.2 06/12/2023   CL 107 (H) 06/12/2023   CO2 23 06/12/2023   Lab Results  Component Value Date   ALT 18 06/12/2023   AST 17 06/12/2023   ALKPHOS 94 06/12/2023   BILITOT 0.2 06/12/2023   Lab Results  Component Value Date   HGBA1C 5.9 (H) 06/12/2023   HGBA1C 5.9 (H) 01/01/2023  HGBA1C 5.9 (H) 12/16/2021   HGBA1C 5.7 (H) 01/17/2021   Lab Results  Component Value Date   INSULIN 23.5 06/12/2023   INSULIN 20.1 01/01/2023   Lab Results  Component Value Date   TSH 6.150 (H) 06/12/2023   Lab Results  Component Value Date   CHOL 173 06/12/2023   HDL 38 (L) 06/12/2023   LDLCALC 118 (H) 06/12/2023   TRIG 91 06/12/2023   Lab Results  Component Value Date   VD25OH 56.1 06/12/2023   VD25OH 37.8 01/01/2023   VD25OH 36 03/24/2021   Lab Results  Component Value Date   WBC 6.3 01/01/2023   HGB 13.5 01/01/2023   HCT 41.1 01/01/2023   MCV 91 01/01/2023   PLT 356 01/01/2023    No results found for: "IRON", "TIBC", "FERRITIN"  Attestation Statements:   Reviewed by clinician on day of visit: allergies, medications, problem list, medical history, surgical history, family history, social history, and previous encounter notes.   I, Burt Knack, am acting as transcriptionist for Quillian Quince, MD.  I have reviewed the above documentation for accuracy and completeness, and I agree with the above. -  Quillian Quince, MD

## 2023-07-16 NOTE — Progress Notes (Unsigned)
Cardiology Office Note:    Date:  07/16/2023   ID:  Stacey Kaiser, DOB 21-Mar-1971, MRN [id number]  PCP:  Stacey Frisk, MD  Cardiologist:  Stacey Ishikawa, MD  Electrophysiologist:  None   Referring MD: Stacey Frisk, MD   No chief complaint on file.   History of Present Illness:    Stacey Kaiser is a 52 y.o. female with a hx of morbid obesity who is referred by Stacey Co, PA for evaluation of dyspnea.  Echocardiogram 01/19/2021 showed EF 55 to 60%, normal diastolic function, normal RV function, RVSP 50 mmHg, mild MR. She was admitted in April 2022 with discitis/osteomyelitis and compression fractures in spine.  She continues on oral antibiotics, follows with ID.  She reports she has been having dyspnea with exertion.  States gets short of breath with minimal exertion.  She denies any chest pain.  Denies any lightheadedness, syncope, palpitations, or lower extremity edema.  No smoking history.  Family history includes mother had CABG in 56s and CHF.  Since last clinic visit, she reports weight is down 8 pounds.  Insurance would not cover [employer].  Reports dyspnea has improved.  She denies any chest pain, palpitations.  Does report some lightheadedness, denies any syncope.  Reports occasional lower extremity edema.  She is walking 10 minutes daily.   Wt Readings from Last 3 Encounters:  07/12/23 288 lb (130.6 kg)  06/12/23 292 lb (132.5 kg)  05/08/23 288 lb (130.6 kg)   BP Readings from Last 3 Encounters:  07/12/23 114/77  06/12/23 139/76  05/08/23 134/78     Past Medical History:  Diagnosis Date   Back pain    Depression    Diskitis 01/17/2021   Elevated serum creatinine 03/24/2021   Family history of adverse reaction to anesthesia    " my mother had a seizure coming out of cabg surgery "   Hypertension    Joint pain    Morbid obesity (HCC)    Morbid obesity with BMI of 50.0-59.9, adult (HCC) 01/17/2021   Pre-diabetes    Scoliosis     Sleep apnea    SOB (shortness of breath)    Swelling of both lower extremities     Past Surgical History:  Procedure Laterality Date   IR FLUORO GUIDED NEEDLE PLC ASPIRATION/INJECTION LOC  01/18/2021   RADIOLOGY WITH ANESTHESIA N/A 01/18/2021   Procedure: IR WITH ANESTHESIA;  Surgeon: Radiologist, Medication, MD;  Location: [facility] OR;  Service: Radiology;  Laterality: N/A;   WISDOM TOOTH EXTRACTION      Current Medications: No outpatient medications have been marked as taking for the 07/17/23 encounter (Appointment) with Stacey Ishikawa, MD.     Allergies:   Okra and Other   Social History   Socioeconomic History   Marital status: Media planner    Spouse name: Not on file   Number of children: 0   Years of education: Not on file   Highest education level: Master's degree (e.g., MA, MS, MEng, MEd, MSW, MBA)  Occupational History   Occupation: retail at [employer]  Tobacco Use   Smoking status: Never   Smokeless tobacco: Never  Vaping Use   Vaping status: Never Used  Substance and Sexual Activity   Alcohol use: Not Currently    Comment: rare   Drug use: No   Sexual activity: Not on file  Other Topics Concern   Not on file  Social History Narrative   Not on file  Social Determinants of Health   Financial Resource Strain: Medium Risk (02/27/2023)   Overall Financial Resource Strain (CARDIA)    Difficulty of Paying Living Expenses: Somewhat hard  Food Insecurity: Food Insecurity Present (02/27/2023)   Hunger Vital Sign    Worried About Running Out of Food in the Last Year: Sometimes true    Ran Out of Food in the Last Year: Never true  Transportation Needs: No Transportation Needs (02/27/2023)   PRAPARE - Administrator, Civil Service (Medical): No    Lack of Transportation (Non-Medical): No  Physical Activity: Unknown (02/27/2023)   Exercise Vital Sign    Days of Exercise per Week: 0 days    Minutes of Exercise per Session: Not on file   Stress: Stress Concern Present (02/27/2023)   [employer] of Occupational Health - Occupational Stress Questionnaire    Feeling of Stress : To some extent  Social Connections: Moderately Isolated (02/27/2023)   Social Connection and Isolation Panel [NHANES]    Frequency of Communication with Friends and Family: Once a week    Frequency of Social Gatherings with Friends and Family: Never    Attends Religious Services: Never    Database administrator or Organizations: Yes    Attends Engineer, structural: More than 4 times per year    Marital Status: Living with partner     Family History: The patient's family history includes Breast cancer in her cousin; Chronic Renal Failure in her mother; Diabetes in her mother; Heart failure in her mother.  ROS:   Please see the history of present illness.     All other systems reviewed and are negative.  EKGs/Labs/Other Studies Reviewed:    The following studies were reviewed today:   EKG:   06/06/2022: Normal sinus rhythm, rate 71, left axis deviation, poor R wave progression 12/07/21: NSR, LAD, poor R wave progression, rate 91  Recent Labs: 01/01/2023: Hemoglobin 13.5; Platelets 356 06/12/2023: ALT 18; BNP 40.9; BUN 16; Creatinine, Ser 0.80; Potassium 4.2; Sodium 143; TSH 6.150  Recent Lipid Panel    Component Value Date/Time   CHOL 173 06/12/2023 0809   TRIG 91 06/12/2023 0809   HDL 38 (L) 06/12/2023 0809   LDLCALC 118 (H) 06/12/2023 0809    Physical Exam:    VS:  There were no vitals taken for this visit.    Wt Readings from Last 3 Encounters:  07/12/23 288 lb (130.6 kg)  06/12/23 292 lb (132.5 kg)  05/08/23 288 lb (130.6 kg)     GEN:  Well nourished, well developed in no acute distress HEENT: Normal NECK: No JVD; No carotid bruits LYMPHATICS: No lymphadenopathy CARDIAC: RRR, no murmurs, rubs, gallops RESPIRATORY:  Clear to auscultation without rales, wheezing or rhonchi  ABDOMEN: Soft, non-tender,  non-distended MUSCULOSKELETAL:  No edema; No deformity  SKIN: Warm and dry NEUROLOGIC:  Alert and oriented x 3 PSYCHIATRIC:  Normal affect   ASSESSMENT:    No diagnosis found.    PLAN:    Dyspnea: Suspect due to obesity/deconditioning.  Echocardiogram in April 2022 showed normal systolic/diastolic function.  Elevated pulmonary pressures on echo, suspect untreated OSA/OHS.  Discussed obtaining sleep study but patient is self-pay and would prefer to hold off at this time.  Asked our social worker to reach out to the patient about available resources.  Referred to healthy weight and wellness.  If if dyspnea does not improve with weight loss, would recommend further work-up including ischemia evaluation. -She reports improvement in  her dyspnea.  Continue Lasix 20 mg daily.  Will check BMET/magnesium  Morbid obesity: There is no height or weight on file to calculate BMI.  Referred to healthy weight and wellness.  Started on Wegovy but was not covered by insurance, was discontinued  Hypertension: On losartan 100 mg daily.  Appears controlled.  OSA: Mild OSA on sleep study 07/2022***   RTC in 1 year***  Medication Adjustments/Labs and Tests Ordered: Current medicines are reviewed at length with the patient today.  Concerns regarding medicines are outlined above.  No orders of the defined types were placed in this encounter.  No orders of the defined types were placed in this encounter.   There are no Patient Instructions on file for this visit.   Signed, Stacey Ishikawa, MD  07/16/2023 8:40 PM    [facility]

## 2023-07-17 ENCOUNTER — Ambulatory Visit: Payer: Managed Care, Other (non HMO) | Attending: Cardiology | Admitting: Cardiology

## 2023-07-17 ENCOUNTER — Encounter: Payer: Self-pay | Admitting: Cardiology

## 2023-07-17 VITALS — BP 122/78 | HR 74 | Ht 58.8 in | Wt 291.0 lb

## 2023-07-17 DIAGNOSIS — G4733 Obstructive sleep apnea (adult) (pediatric): Secondary | ICD-10-CM | POA: Diagnosis not present

## 2023-07-17 DIAGNOSIS — R7989 Other specified abnormal findings of blood chemistry: Secondary | ICD-10-CM

## 2023-07-17 DIAGNOSIS — I10 Essential (primary) hypertension: Secondary | ICD-10-CM

## 2023-07-17 DIAGNOSIS — E7849 Other hyperlipidemia: Secondary | ICD-10-CM | POA: Diagnosis not present

## 2023-07-17 DIAGNOSIS — R0602 Shortness of breath: Secondary | ICD-10-CM

## 2023-07-17 NOTE — Patient Instructions (Signed)
Medication Instructions:  Continue all medication *If you need a refill on your cardiac medications before your next appointment, please call your pharmacy*   Lab Work: Bmet,magnesium,tsh,free t4 have done today   Testing/Procedures: Coronary Calcium Score   Follow-Up: At [facility], you and your health needs are our priority.  As part of our continuing mission to provide you with exceptional heart care, we have created designated Provider Care Teams.  These Care Teams include your primary Cardiologist (physician) and Advanced Practice Providers (APPs -  Physician Assistants and Nurse Practitioners) who all work together to provide you with the care you need, when you need it.  We recommend signing up for the patient portal called "MyChart".  Sign up information is provided on this After Visit Summary.  MyChart is used to connect with patients for Virtual Visits (Telemedicine).  Patients are able to view lab/test results, encounter notes, upcoming appointments, etc.  Non-urgent messages can be sent to your provider as well.   To learn more about what you can do with MyChart, go to [email or web address].    Your next appointment:  1 year     Call in June to schedule Oct appointment     Provider:  Dr.Schumann   Check blood pressure daily for 2 weeks then send readings through [facility]

## 2023-07-18 LAB — BASIC METABOLIC PANEL
BUN/Creatinine Ratio: 20 (ref 9–23)
BUN: 19 mg/dL (ref 6–24)
CO2: 25 mmol/L (ref 20–29)
Calcium: 9.5 mg/dL (ref 8.7–10.2)
Chloride: 103 mmol/L (ref 96–106)
Creatinine, Ser: 0.93 mg/dL (ref 0.57–1.00)
Glucose: 107 mg/dL — ABNORMAL HIGH (ref 70–99)
Potassium: 4.5 mmol/L (ref 3.5–5.2)
Sodium: 140 mmol/L (ref 134–144)
eGFR: 74 mL/min/{1.73_m2} (ref >59)

## 2023-07-18 LAB — MAGNESIUM: Magnesium: 2 mg/dL (ref 1.6–2.3)

## 2023-07-18 LAB — TSH+FREE T4
Free T4: 1.16 ng/dL (ref 0.82–1.77)
TSH: 4.98 u[IU]/mL — ABNORMAL HIGH (ref 0.450–4.500)

## 2023-07-21 ENCOUNTER — Other Ambulatory Visit: Payer: Self-pay | Admitting: Critical Care Medicine

## 2023-07-21 DIAGNOSIS — M4644 Discitis, unspecified, thoracic region: Secondary | ICD-10-CM

## 2023-07-23 NOTE — Telephone Encounter (Signed)
Requested medications are due for refill today.  yes  Requested medications are on the active medications list.  yes  Last refill. 06/27/2023 #60 0 rf  Future visit scheduled.   no  Notes to clinic.  Refill not delegated. Dr.Wright pt.    Requested Prescriptions  Pending Prescriptions Disp Refills   methocarbamol (ROBAXIN) 500 MG tablet [Pharmacy Med Name: Methocarbamol 500 MG Oral Tablet] 60 tablet 0    Sig: TAKE 1 TABLET BY MOUTH EVERY 8 HOURS AS NEEDED FOR MUSCLE SPASM     Not Delegated - Analgesics:  Muscle Relaxants Failed - 07/21/2023  9:23 AM      Failed - This refill cannot be delegated      Passed - Valid encounter within last 6 months    Recent Outpatient Visits           4 months ago Primary hypertension   [place] [facility] Storm Frisk, MD   1 year ago Administrative encounter   [facility] Storm Frisk, MD   1 year ago Primary hypertension   [place] [facility] Storm Frisk, MD   1 year ago Elevated brain natriuretic peptide (BNP) level   [facility] Storm Frisk, MD   1 year ago Osteomyelitis of thoracic vertebra [facility])   [facility] Storm Frisk, MD

## 2023-07-27 ENCOUNTER — Ambulatory Visit (HOSPITAL_COMMUNITY)
Admission: RE | Admit: 2023-07-27 | Discharge: 2023-07-27 | Disposition: A | Discharge status: Home / Self Care | Payer: Managed Care, Other (non HMO) | Source: Ambulatory Visit | Attending: Cardiology | Admitting: Cardiology

## 2023-07-27 ENCOUNTER — Encounter (HOSPITAL_COMMUNITY): Payer: Self-pay

## 2023-07-27 DIAGNOSIS — R0602 Shortness of breath: Secondary | ICD-10-CM | POA: Insufficient documentation

## 2023-07-27 DIAGNOSIS — E7849 Other hyperlipidemia: Secondary | ICD-10-CM | POA: Insufficient documentation

## 2023-08-02 ENCOUNTER — Ambulatory Visit (INDEPENDENT_AMBULATORY_CARE_PROVIDER_SITE_OTHER): Payer: Commercial Managed Care - HMO | Admitting: Family Medicine

## 2023-08-02 ENCOUNTER — Encounter (INDEPENDENT_AMBULATORY_CARE_PROVIDER_SITE_OTHER): Payer: Self-pay | Admitting: Family Medicine

## 2023-08-02 VITALS — BP 136/79 | HR 72 | Temp 97.7°F | Ht <= 58 in | Wt 291.0 lb

## 2023-08-02 DIAGNOSIS — R7303 Prediabetes: Secondary | ICD-10-CM

## 2023-08-02 DIAGNOSIS — E669 Obesity, unspecified: Secondary | ICD-10-CM

## 2023-08-02 DIAGNOSIS — Z6841 Body Mass Index (BMI) 40.0 and over, adult: Secondary | ICD-10-CM | POA: Diagnosis not present

## 2023-08-02 DIAGNOSIS — E559 Vitamin D deficiency, unspecified: Secondary | ICD-10-CM

## 2023-08-02 MED ORDER — VITAMIN D (ERGOCALCIFEROL) 1.25 MG (50000 UNIT) PO CAPS
50000.0000 [IU] | ORAL_CAPSULE | ORAL | 0 refills | Status: DC
Start: 2023-08-02 — End: 2023-08-23

## 2023-08-02 MED ORDER — METFORMIN HCL 500 MG PO TABS
500.0000 mg | ORAL_TABLET | Freq: Every day | ORAL | 0 refills | Status: DC
Start: 2023-08-02 — End: 2023-08-15

## 2023-08-02 NOTE — Progress Notes (Signed)
.smr  Office: [phone]  /  Fax: [phone]  WEIGHT SUMMARY AND BIOMETRICS  Anthropometric Measurements Height: 4\' 9"  (1.448 m) Weight: 291 lb (132 kg) BMI (Calculated): 62.95 Weight at Last Visit: 288 lb Weight Lost Since Last Visit: 0 Weight Gained Since Last Visit: 3 lb Starting Weight: 305 lb Total Weight Loss (lbs): 14 lb (6.35 kg)   Body Composition  Body Fat %: 63.3 % Fat Mass (lbs): 184.4 lbs Muscle Mass (lbs): 101.4 lbs Visceral Fat Rating : 29   Other Clinical Data Fasting: No Labs: No Today's Visit #: 8 Starting Date: 01/01/23    Chief Complaint: OBESITY  History of Present Illness   The patient, diagnosed with obesity, vitamin D deficiency, and prediabetes, presents for a follow-up visit. She reports a weight gain of three pounds over the past three weeks, despite adherence to her category two diet plan approximately 60% of the time. She has been engaging in regular physical activity, walking for ten minutes most days of the week.  The patient is currently on prescription vitamin D (50,000 international units weekly) for her vitamin D deficiency and metformin (500mg  with breakfast) for prediabetes. She has not reported any new medications since the last visit.  The patient has been struggling with meal planning and has expressed frustration with her weight management. She has made dietary changes, including consuming protein shakes and salads, and has significantly reduced her intake of pasta, rice, potatoes, and bread. Despite these changes, she has not observed the desired weight loss.  The patient has also had a recent consultation with another physician, Dr. Allene Pyo, who conducted blood work and a calcium score test to assess for potential heart disease, given the patient's family history. The results were reassuring, showing no signs of plaque in the heart.          PHYSICAL EXAM:  Blood pressure 136/79, pulse 72, temperature 97.7 F (36.5 C),  height 4\' 9"  (1.448 m), weight 291 lb (132 kg), last menstrual period 07/06/2023, SpO2 96%. Body mass index is 62.97 kg/m.  DIAGNOSTIC DATA REVIEWED:  BMET    Component Value Date/Time   NA 140 07/17/2023 0906   K 4.5 07/17/2023 0906   CL 103 07/17/2023 0906   CO2 25 07/17/2023 0906   GLUCOSE 107 (H) 07/17/2023 0906   GLUCOSE 109 (H) 05/03/2021 0922   BUN 19 07/17/2023 0906   CREATININE 0.93 07/17/2023 0906   CREATININE 1.07 (H) 05/03/2021 0922   CALCIUM 9.5 07/17/2023 0906   GFRNONAA >60 02/28/2021 1721   Lab Results  Component Value Date   HGBA1C 5.9 (H) 06/12/2023   HGBA1C 5.7 (H) 01/17/2021   Lab Results  Component Value Date   INSULIN 23.5 06/12/2023   INSULIN 20.1 01/01/2023   Lab Results  Component Value Date   TSH 4.980 (H) 07/17/2023   CBC    Component Value Date/Time   WBC 6.3 01/01/2023 0928   WBC 8.0 03/24/2021 0933   RBC 4.51 01/01/2023 0928   RBC 4.79 03/24/2021 0933   HGB 13.5 01/01/2023 0928   HCT 41.1 01/01/2023 0928   PLT 356 01/01/2023 0928   MCV 91 01/01/2023 0928   MCH 29.9 01/01/2023 0928   MCH 29.4 03/24/2021 0933   MCHC 32.8 01/01/2023 0928   MCHC 33.6 03/24/2021 0933   RDW 13.0 01/01/2023 0928   Iron Studies No results found for: "IRON", "TIBC", "FERRITIN", "IRONPCTSAT" Lipid Panel     Component Value Date/Time   CHOL 173 06/12/2023 0809  TRIG 91 06/12/2023 0809   HDL 38 (L) 06/12/2023 0809   LDLCALC 118 (H) 06/12/2023 0809   Hepatic Function Panel     Component Value Date/Time   PROT 6.5 06/12/2023 0809   ALBUMIN 3.9 06/12/2023 0809   AST 17 06/12/2023 0809   ALT 18 06/12/2023 0809   ALKPHOS 94 06/12/2023 0809   BILITOT 0.2 06/12/2023 0809      Component Value Date/Time   TSH 4.980 (H) 07/17/2023 0906   Nutritional Lab Results  Component Value Date   VD25OH 56.1 06/12/2023   VD25OH 37.8 01/01/2023   VD25OH 36 03/24/2021     Assessment and Plan    Obesity Weight gain of 3 pounds in the last 3 weeks.  Adherence to category two plan approximately 60% of the time. Walking for exercise most days of the week. Discussed the importance of consistent meal planning and adherence to the category two plan. -Continue category two plan and try to increase adherence. -Continue walking for exercise most days of the week.  Vitamin D deficiency On prescription vitamin D 50,000 international units weekly. -Refill prescription for vitamin D 50,000 international units weekly.  Prediabetes On metformin 500mg  with breakfast. -Refill prescription for metformin 500mg  with breakfast.  Follow-up in 1 month on August 23, 2023.       She was informed of the importance of frequent follow up visits to maximize her success with intensive lifestyle modifications for her multiple health conditions.    Quillian Quince, MD

## 2023-08-04 ENCOUNTER — Other Ambulatory Visit: Payer: Self-pay | Admitting: Critical Care Medicine

## 2023-08-04 DIAGNOSIS — M4644 Discitis, unspecified, thoracic region: Secondary | ICD-10-CM

## 2023-08-11 ENCOUNTER — Other Ambulatory Visit: Payer: Self-pay | Admitting: Critical Care Medicine

## 2023-08-11 DIAGNOSIS — M4644 Discitis, unspecified, thoracic region: Secondary | ICD-10-CM

## 2023-08-15 ENCOUNTER — Encounter: Payer: Self-pay | Admitting: Critical Care Medicine

## 2023-08-15 ENCOUNTER — Telehealth: Payer: Self-pay | Admitting: Critical Care Medicine

## 2023-08-15 ENCOUNTER — Telehealth: Payer: Commercial Managed Care - HMO | Admitting: Physician Assistant

## 2023-08-15 DIAGNOSIS — R7989 Other specified abnormal findings of blood chemistry: Secondary | ICD-10-CM | POA: Diagnosis not present

## 2023-08-15 DIAGNOSIS — M4644 Discitis, unspecified, thoracic region: Secondary | ICD-10-CM

## 2023-08-15 DIAGNOSIS — R635 Abnormal weight gain: Secondary | ICD-10-CM | POA: Diagnosis not present

## 2023-08-15 DIAGNOSIS — R7303 Prediabetes: Secondary | ICD-10-CM | POA: Diagnosis not present

## 2023-08-15 MED ORDER — FUROSEMIDE 20 MG PO TABS: 20.0000 mg | ORAL_TABLET | Freq: Every day | ORAL | 2 refills | Status: DC

## 2023-08-15 MED ORDER — METFORMIN HCL 500 MG PO TABS: 500.0000 mg | ORAL_TABLET | Freq: Every day | ORAL | 1 refills | Status: DC

## 2023-08-15 MED ORDER — LOSARTAN POTASSIUM 100 MG PO TABS: 100.0000 mg | ORAL_TABLET | Freq: Every day | ORAL | 3 refills | Status: DC

## 2023-08-15 MED ORDER — GABAPENTIN 300 MG PO CAPS: 600.0000 mg | ORAL_CAPSULE | Freq: Two times a day (BID) | ORAL | 0 refills | Status: DC

## 2023-08-15 MED ORDER — SERTRALINE HCL 50 MG PO TABS: 50.0000 mg | ORAL_TABLET | Freq: Every day | ORAL | 1 refills | Status: DC

## 2023-08-15 NOTE — Progress Notes (Signed)
Virtual Visit via Video Note  I connected with [name] on 08/15/23 at  4:10 PM EDT by a video enabled telemedicine application and verified that I am speaking with the correct person using two identifiers.  Location: Patient: home Provider: [facility]   I discussed the limitations of evaluation and management by telemedicine and the availability of in person appointments. The patient expressed understanding and agreed to proceed.  History of Present Illness:  Stacey Kaiser needs RF on meds today.  She has follow ups scheduled with Dr Dalbert Garnet.  She was a patient of Dr Lynelle Doctor and needs to get reassigned to PCP here.  She is also followed by cardiology whom she saw earlier this month.  She denies any new issues or concerns.  Denies CP/SOB    Observations/Objective:  NAd.  Speech clear.  Breathing non-labored   Assessment and Plan: 1. Prediabetes A1C=5.9 05/2023 - metFORMIN (GLUCOPHAGE) 500 MG tablet; Take 1 tablet (500 mg total) by mouth daily with breakfast.  Dispense: 90 tablet; Refill: 1  2. Discitis of thoracic region Stable and sees specialist - gabapentin (NEURONTIN) 300 MG capsule; Take 2 capsules (600 mg total) by mouth 2 (two) times daily.  Dispense: 360 capsule; Refill: 0  3. Weight gain stable - furosemide (LASIX) 20 MG tablet; Take 1 tablet (20 mg total) by mouth daily.  Dispense: 90 tablet; Refill: 2  4. Elevated brain natriuretic peptide (BNP) level stable - furosemide (LASIX) 20 MG tablet; Take 1 tablet (20 mg total) by mouth daily.  Dispense: 90 tablet; Refill: 2  Reviewed recent labs Follow Up Instructions: Please assign to one of our PCP for 3 months f/up   I discussed the assessment and treatment plan with the patient. The patient was provided an opportunity to ask questions and all were answered. The patient agreed with the plan and demonstrated an understanding of the instructions.   The patient was advised to call back or seek an in-person evaluation  if the symptoms worsen or if the condition fails to improve as anticipated.  I provided 15 minutes of non-face-to-face time during this encounter.   Georgian Co, PA-C  Patient ID: Stacey Kaiser, female   DOB: 1971-01-31, 52 y.o.   MRN: [id number]

## 2023-08-17 ENCOUNTER — Encounter: Payer: Self-pay | Admitting: Physician Assistant

## 2023-08-21 ENCOUNTER — Other Ambulatory Visit: Payer: Self-pay | Admitting: Critical Care Medicine

## 2023-08-21 DIAGNOSIS — M4644 Discitis, unspecified, thoracic region: Secondary | ICD-10-CM

## 2023-08-21 MED ORDER — METHOCARBAMOL 500 MG PO TABS
500.0000 mg | ORAL_TABLET | Freq: Three times a day (TID) | ORAL | 0 refills | Status: DC | PRN
Start: 2023-08-21 — End: 2023-09-10

## 2023-08-21 NOTE — Telephone Encounter (Signed)
Requested medication (s) are due for refill today: Yes  Requested medication (s) are on the active medication list:Yes  Last refill:  07/23/23  Future visit scheduled: Yes  Notes to clinic:  Unable to refill per protocol, cannot delegate.      Requested Prescriptions  Pending Prescriptions Disp Refills   methocarbamol (ROBAXIN) 500 MG tablet 60 tablet 0     Not Delegated - Analgesics:  Muscle Relaxants Failed - 08/21/2023 12:01 PM      Failed - This refill cannot be delegated      Passed - Valid encounter within last 6 months    Recent Outpatient Visits           6 days ago Prediabetes   [place] [facility] [place] [place] [place]   5 months ago Primary hypertension   [place] [facility] [place] [facility] [place]. [facility] Storm Frisk, MD   1 year ago Administrative encounter   [place] [facility] [place]. [facility] Storm Frisk, MD   1 year ago Primary hypertension   [place] [facility] [place] [facility] [place]. [facility] Storm Frisk, MD   1 year ago Elevated brain natriuretic peptide (BNP) level   [place] [facility] Of . [facility] Storm Frisk, MD       Future Appointments             In 2 months Laural Benes Binnie Rail, MD [facility] - A Dept Of Eligha Bridegroom. [facility]

## 2023-08-21 NOTE — Telephone Encounter (Signed)
Medication Refill - Medication: methocarbamol (ROBAXIN) 500 MG tablet   Has the patient contacted their pharmacy? Yes.   Pt following up on refill request.  Dr Delford Field was the last to fill, and she had virtual w/ Marylene Land on 10/30.  This is only Rx she did not get.  Pt states she used to p/u this Rx 2 X a month.  Preferred Pharmacy (with phone number or street name):  [facility] [address] [place], [place] - [address]   Has the patient been seen for an appointment in the last year OR does the patient have an upcoming appointment? Yes.    Pt states she is out of medication and needs for her pain.   Agent: Please be advised that RX refills may take up to 3 business days. We ask that you follow-up with your pharmacy.

## 2023-08-23 ENCOUNTER — Ambulatory Visit (INDEPENDENT_AMBULATORY_CARE_PROVIDER_SITE_OTHER): Payer: Managed Care, Other (non HMO) | Admitting: Family Medicine

## 2023-08-23 ENCOUNTER — Encounter (INDEPENDENT_AMBULATORY_CARE_PROVIDER_SITE_OTHER): Payer: Self-pay | Admitting: Family Medicine

## 2023-08-23 VITALS — BP 127/65 | HR 86 | Temp 97.6°F | Ht <= 58 in | Wt 291.0 lb

## 2023-08-23 DIAGNOSIS — Z6841 Body Mass Index (BMI) 40.0 and over, adult: Secondary | ICD-10-CM | POA: Diagnosis not present

## 2023-08-23 DIAGNOSIS — E559 Vitamin D deficiency, unspecified: Secondary | ICD-10-CM | POA: Diagnosis not present

## 2023-08-23 DIAGNOSIS — E669 Obesity, unspecified: Secondary | ICD-10-CM | POA: Diagnosis not present

## 2023-08-23 DIAGNOSIS — R7303 Prediabetes: Secondary | ICD-10-CM

## 2023-08-23 MED ORDER — METFORMIN HCL 500 MG PO TABS
500.0000 mg | ORAL_TABLET | Freq: Two times a day (BID) | ORAL | 0 refills | Status: DC
Start: 2023-08-23 — End: 2023-09-24

## 2023-08-23 MED ORDER — VITAMIN D (ERGOCALCIFEROL) 1.25 MG (50000 UNIT) PO CAPS
50000.0000 [IU] | ORAL_CAPSULE | ORAL | 0 refills | Status: DC
Start: 2023-08-23 — End: 2023-09-24

## 2023-08-23 NOTE — Progress Notes (Signed)
.smr  Office: [phone]  /  Fax: [phone]  WEIGHT SUMMARY AND BIOMETRICS  Anthropometric Measurements Height: 4\' 9"  (1.448 m) Weight: 291 lb (132 kg) BMI (Calculated): 62.95 Weight at Last Visit: 291 lb Weight Lost Since Last Visit: 0 Weight Gained Since Last Visit: 0 Starting Weight: 305 lb Total Weight Loss (lbs): 14 lb (6.35 kg)   Body Composition  Body Fat %: 63.7 % Fat Mass (lbs): 185.8 lbs Muscle Mass (lbs): 100.6 lbs Visceral Fat Rating : 29   Other Clinical Data Fasting: No Labs: No Today's Visit #: 9 Starting Date: 01/01/23    Chief Complaint: OBESITY   History of Present Illness   The patient, diagnosed with vitamin D deficiency and obesity, has maintained her weight over the past month. She reports adherence to her category two eating plan approximately 60% of the time and engages in walking for about ten minutes most days of the week. She is currently on prescription vitamin D and requests a refill.  The patient expresses frustration with her current eating plan, feeling that she is not overeating and may not be eating enough. She expresses a desire for more options in her diet plan.  The patient is also on metformin for prediabetes, with her last A1C at 5.9. She reports no gastrointestinal issues with the medication.  The patient's social history reveals limited interaction with neighbors and a preference for staying within her own "bubble." She mentions a recent birthday and a lunch with a family member. The patient's future plans include a follow-up appointment with a new primary care provider.          PHYSICAL EXAM:  Blood pressure 127/65, pulse 86, temperature 97.6 F (36.4 C), height 4\' 9"  (1.448 m), weight 291 lb (132 kg), last menstrual period 07/06/2023, SpO2 96%. Body mass index is 62.97 kg/m.  DIAGNOSTIC DATA REVIEWED:  BMET    Component Value Date/Time   NA 140 07/17/2023 0906   K 4.5 07/17/2023 0906   CL 103 07/17/2023 0906    CO2 25 07/17/2023 0906   GLUCOSE 107 (H) 07/17/2023 0906   GLUCOSE 109 (H) 05/03/2021 0922   BUN 19 07/17/2023 0906   CREATININE 0.93 07/17/2023 0906   CREATININE 1.07 (H) 05/03/2021 0922   CALCIUM 9.5 07/17/2023 0906   GFRNONAA >60 02/28/2021 1721   Lab Results  Component Value Date   HGBA1C 5.9 (H) 06/12/2023   HGBA1C 5.7 (H) 01/17/2021   Lab Results  Component Value Date   INSULIN 23.5 06/12/2023   INSULIN 20.1 01/01/2023   Lab Results  Component Value Date   TSH 4.980 (H) 07/17/2023   CBC    Component Value Date/Time   WBC 6.3 01/01/2023 0928   WBC 8.0 03/24/2021 0933   RBC 4.51 01/01/2023 0928   RBC 4.79 03/24/2021 0933   HGB 13.5 01/01/2023 0928   HCT 41.1 01/01/2023 0928   PLT 356 01/01/2023 0928   MCV 91 01/01/2023 0928   MCH 29.9 01/01/2023 0928   MCH 29.4 03/24/2021 0933   MCHC 32.8 01/01/2023 0928   MCHC 33.6 03/24/2021 0933   RDW 13.0 01/01/2023 0928   Iron Studies No results found for: "IRON", "TIBC", "FERRITIN", "IRONPCTSAT" Lipid Panel     Component Value Date/Time   CHOL 173 06/12/2023 0809   TRIG 91 06/12/2023 0809   HDL 38 (L) 06/12/2023 0809   LDLCALC 118 (H) 06/12/2023 0809   Hepatic Function Panel     Component Value Date/Time   PROT 6.5 06/12/2023  0809   ALBUMIN 3.9 06/12/2023 0809   AST 17 06/12/2023 0809   ALT 18 06/12/2023 0809   ALKPHOS 94 06/12/2023 0809   BILITOT 0.2 06/12/2023 0809      Component Value Date/Time   TSH 4.980 (H) 07/17/2023 0906   Nutritional Lab Results  Component Value Date   VD25OH 56.1 06/12/2023   VD25OH 37.8 01/01/2023   VD25OH 36 03/24/2021     Assessment and Plan    Obesity Maintained weight over the past month. Adherence to category two eating plan approximately 60% of the time. Walking for about 10 minutes most days of the week. Expressed frustration and requested alternative options. -Introduced Dispensing optician as an alternative to category two plan. Encouraged to follow one plan  for the entire day and not switch between plans within the same day. -Continue physical activity regimen.  Vitamin D Deficiency On prescription vitamin D and requested a refill. -Refill prescription for vitamin D.  Prediabetes Last A1C was 5.9 in August, indicating prediabetes is stable. Currently on low dose Metformin with no GI issues. -Increase Metformin to twice daily, taken with breakfast and lunch. -Check A1C after the holidays.  Follow-up Patient has an appointment with new primary care provider, Dr. Collie Siad, in January. Plan to discuss Christmas strategies at next visit.       She was informed of the importance of frequent follow up visits to maximize her success with intensive lifestyle modifications for her multiple health conditions.    Quillian Quince, MD

## 2023-08-31 ENCOUNTER — Telehealth: Payer: Self-pay | Admitting: Cardiology

## 2023-08-31 NOTE — Telephone Encounter (Signed)
[place] from [employer] called about prior authorization for CT of the heart. [phone]....case# [id number]

## 2023-08-31 NOTE — Telephone Encounter (Signed)
Routed to pre-cert team

## 2023-09-08 ENCOUNTER — Other Ambulatory Visit: Payer: Self-pay | Admitting: Critical Care Medicine

## 2023-09-08 DIAGNOSIS — M4644 Discitis, unspecified, thoracic region: Secondary | ICD-10-CM

## 2023-09-24 ENCOUNTER — Encounter (INDEPENDENT_AMBULATORY_CARE_PROVIDER_SITE_OTHER): Payer: Self-pay | Admitting: Family Medicine

## 2023-09-24 ENCOUNTER — Ambulatory Visit (INDEPENDENT_AMBULATORY_CARE_PROVIDER_SITE_OTHER): Payer: Commercial Managed Care - HMO | Admitting: Family Medicine

## 2023-09-24 VITALS — BP 134/81 | HR 70 | Temp 97.8°F | Ht <= 58 in | Wt 293.0 lb

## 2023-09-24 DIAGNOSIS — R7303 Prediabetes: Secondary | ICD-10-CM | POA: Diagnosis not present

## 2023-09-24 DIAGNOSIS — E559 Vitamin D deficiency, unspecified: Secondary | ICD-10-CM | POA: Diagnosis not present

## 2023-09-24 DIAGNOSIS — E669 Obesity, unspecified: Secondary | ICD-10-CM

## 2023-09-24 DIAGNOSIS — Z6841 Body Mass Index (BMI) 40.0 and over, adult: Secondary | ICD-10-CM

## 2023-09-24 MED ORDER — METFORMIN HCL 500 MG PO TABS
500.0000 mg | ORAL_TABLET | Freq: Three times a day (TID) | ORAL | 0 refills | Status: DC
Start: 2023-09-24 — End: 2023-11-16

## 2023-09-24 MED ORDER — VITAMIN D (ERGOCALCIFEROL) 1.25 MG (50000 UNIT) PO CAPS
50000.0000 [IU] | ORAL_CAPSULE | ORAL | 0 refills | Status: DC
Start: 2023-09-24 — End: 2023-11-20

## 2023-09-24 NOTE — Progress Notes (Signed)
.smr  Office: [phone]  /  Fax: [phone]  WEIGHT SUMMARY AND BIOMETRICS  Anthropometric Measurements Height: 4\' 9"  (1.448 m) Weight: 293 lb (132.9 kg) BMI (Calculated): 63.39 Weight at Last Visit: 291 Weight Lost Since Last Visit: 0 Weight Gained Since Last Visit: 2 lb Starting Weight: 305 lb Total Weight Loss (lbs): 12 lb (5.443 kg)   Body Composition  Body Fat %: 62.5 % Fat Mass (lbs): 183.6 lbs Muscle Mass (lbs): 104.6 lbs Visceral Fat Rating : 29   Other Clinical Data Fasting: no Labs: no Today's Visit #: 10 Starting Date: 01/01/23    Chief Complaint: OBESITY   History of Present Illness   The patient, currently on metformin for prediabetes, presents today for a medication refill and to discuss her obesity. She reports adherence to a pescetarian diet approximately 60% of the time, but has gained two pounds over the past month, including over the Thanksgiving holiday. She is attempting to increase her physical activity, currently engaging in about ten minutes of daily exercise.  The patient has been experiencing back pain, which she attributes to a spinal cord issue. The pain reportedly worsens as the day progresses, becoming excruciating by bedtime. She has been advised to limit her physical activity to ten minutes of walking daily to avoid exacerbating the pain.  The patient also reports abstaining from alcohol due to potential interactions with her medications. She denies any adverse effects from the metformin. She is currently unemployed and on disability, which she perceives as reducing her exposure to dietary temptations.  The patient has a history of falls, with two incidents occurring after her hospital discharge. However, she reports no recent falls. She expresses a desire to improve her posture and core strength to prevent future falls and alleviate her back pain.          PHYSICAL EXAM:  Blood pressure 134/81, pulse 70, temperature 97.8 F (36.6  C), height 4\' 9"  (1.448 m), weight 293 lb (132.9 kg), last menstrual period 08/31/2023, SpO2 97%. Body mass index is 63.4 kg/m.  DIAGNOSTIC DATA REVIEWED:  BMET    Component Value Date/Time   NA 140 07/17/2023 0906   K 4.5 07/17/2023 0906   CL 103 07/17/2023 0906   CO2 25 07/17/2023 0906   GLUCOSE 107 (H) 07/17/2023 0906   GLUCOSE 109 (H) 05/03/2021 0922   BUN 19 07/17/2023 0906   CREATININE 0.93 07/17/2023 0906   CREATININE 1.07 (H) 05/03/2021 0922   CALCIUM 9.5 07/17/2023 0906   GFRNONAA >60 02/28/2021 1721   Lab Results  Component Value Date   HGBA1C 5.9 (H) 06/12/2023   HGBA1C 5.7 (H) 01/17/2021   Lab Results  Component Value Date   INSULIN 23.5 06/12/2023   INSULIN 20.1 01/01/2023   Lab Results  Component Value Date   TSH 4.980 (H) 07/17/2023   CBC    Component Value Date/Time   WBC 6.3 01/01/2023 0928   WBC 8.0 03/24/2021 0933   RBC 4.51 01/01/2023 0928   RBC 4.79 03/24/2021 0933   HGB 13.5 01/01/2023 0928   HCT 41.1 01/01/2023 0928   PLT 356 01/01/2023 0928   MCV 91 01/01/2023 0928   MCH 29.9 01/01/2023 0928   MCH 29.4 03/24/2021 0933   MCHC 32.8 01/01/2023 0928   MCHC 33.6 03/24/2021 0933   RDW 13.0 01/01/2023 0928   Iron Studies No results found for: "IRON", "TIBC", "FERRITIN", "IRONPCTSAT" Lipid Panel     Component Value Date/Time   CHOL 173 06/12/2023 0809  TRIG 91 06/12/2023 0809   HDL 38 (L) 06/12/2023 0809   LDLCALC 118 (H) 06/12/2023 0809   Hepatic Function Panel     Component Value Date/Time   PROT 6.5 06/12/2023 0809   ALBUMIN 3.9 06/12/2023 0809   AST 17 06/12/2023 0809   ALT 18 06/12/2023 0809   ALKPHOS 94 06/12/2023 0809   BILITOT 0.2 06/12/2023 0809      Component Value Date/Time   TSH 4.980 (H) 07/17/2023 0906   Nutritional Lab Results  Component Value Date   VD25OH 56.1 06/12/2023   VD25OH 37.8 01/01/2023   VD25OH 36 03/24/2021     Assessment and Plan    Prediabetes Prediabetes managed with metformin.  Currently taking one pill twice a day without gastrointestinal side effects. Discussed increasing metformin to three times a day with meals to optimize glycemic control, noting potential for nausea at higher doses. Patient demonstrates good adherence using a pill box. - Increase metformin to three times a day with meals - Refill metformin prescription  Obesity Obesity with recent weight gain of two pounds over the last month, including over Thanksgiving. Following a pescetarian diet 60% of the time and increasing exercise. Currently walking ten minutes daily. Discussed importance of core strengthening exercises for back pain and overall core strength, including use of a wobble cushion. - Continue current exercise regimen of ten minutes of walking daily - Introduce core strengthening exercises and follow the pescatarian eating plan - Consider using a wobble cushion for core exercises  Vitamin D deficiency Vitamin D supplementation with good levels noted on last check. - Refill vitamin D prescription  Follow-up - Schedule follow-up appointment in February - Plan for next visit to be fasting with labs.        She was informed of the importance of frequent follow up visits to maximize her success with intensive lifestyle modifications for her multiple health conditions.    Quillian Quince, MD

## 2023-10-01 ENCOUNTER — Other Ambulatory Visit: Payer: Self-pay | Admitting: Critical Care Medicine

## 2023-10-01 DIAGNOSIS — M4644 Discitis, unspecified, thoracic region: Secondary | ICD-10-CM

## 2023-10-02 ENCOUNTER — Other Ambulatory Visit: Payer: Self-pay | Admitting: Family Medicine

## 2023-10-02 DIAGNOSIS — M4644 Discitis, unspecified, thoracic region: Secondary | ICD-10-CM

## 2023-10-02 MED ORDER — METHOCARBAMOL 500 MG PO TABS
500.0000 mg | ORAL_TABLET | Freq: Three times a day (TID) | ORAL | 1 refills | Status: DC | PRN
Start: 2023-10-02 — End: 2023-11-26

## 2023-10-22 ENCOUNTER — Encounter (INDEPENDENT_AMBULATORY_CARE_PROVIDER_SITE_OTHER): Payer: Self-pay

## 2023-10-22 ENCOUNTER — Ambulatory Visit (INDEPENDENT_AMBULATORY_CARE_PROVIDER_SITE_OTHER): Payer: Managed Care, Other (non HMO) | Admitting: Family Medicine

## 2023-10-22 ENCOUNTER — Encounter (INDEPENDENT_AMBULATORY_CARE_PROVIDER_SITE_OTHER): Payer: Self-pay | Admitting: Family Medicine

## 2023-11-06 ENCOUNTER — Other Ambulatory Visit (INDEPENDENT_AMBULATORY_CARE_PROVIDER_SITE_OTHER): Payer: Self-pay | Admitting: Family Medicine

## 2023-11-06 DIAGNOSIS — R7303 Prediabetes: Secondary | ICD-10-CM

## 2023-11-07 ENCOUNTER — Other Ambulatory Visit (HOSPITAL_COMMUNITY): Payer: Self-pay | Admitting: Neurosurgery

## 2023-11-07 DIAGNOSIS — M4644 Discitis, unspecified, thoracic region: Secondary | ICD-10-CM

## 2023-11-16 ENCOUNTER — Ambulatory Visit: Payer: Commercial Managed Care - HMO | Attending: Internal Medicine | Admitting: Internal Medicine

## 2023-11-16 VITALS — BP 152/78 | HR 94 | Temp 98.0°F | Ht <= 58 in | Wt 302.0 lb

## 2023-11-16 DIAGNOSIS — E66813 Obesity, class 3: Secondary | ICD-10-CM

## 2023-11-16 DIAGNOSIS — R7303 Prediabetes: Secondary | ICD-10-CM

## 2023-11-16 DIAGNOSIS — F330 Major depressive disorder, recurrent, mild: Secondary | ICD-10-CM

## 2023-11-16 DIAGNOSIS — Z7984 Long term (current) use of oral hypoglycemic drugs: Secondary | ICD-10-CM

## 2023-11-16 DIAGNOSIS — I10 Essential (primary) hypertension: Secondary | ICD-10-CM | POA: Diagnosis not present

## 2023-11-16 DIAGNOSIS — Z23 Encounter for immunization: Secondary | ICD-10-CM

## 2023-11-16 DIAGNOSIS — Z6841 Body Mass Index (BMI) 40.0 and over, adult: Secondary | ICD-10-CM

## 2023-11-16 DIAGNOSIS — E559 Vitamin D deficiency, unspecified: Secondary | ICD-10-CM | POA: Diagnosis not present

## 2023-11-16 DIAGNOSIS — Z1211 Encounter for screening for malignant neoplasm of colon: Secondary | ICD-10-CM

## 2023-11-16 MED ORDER — METFORMIN HCL 500 MG PO TABS
500.0000 mg | ORAL_TABLET | Freq: Three times a day (TID) | ORAL | 3 refills | Status: DC
Start: 2023-11-16 — End: 2023-12-26

## 2023-11-16 NOTE — Progress Notes (Signed)
Patient ID: Stacey Kaiser, female    DOB: 16-Dec-1970  MRN: [id number]  CC: Hypertension (HTN f/u. Med refills./No questions / concerns/No to pap. Discuss shingles & pneumonia vax)   Subjective: Stacey Kaiser is a 53 y.o. female who presents for chronic ds management. BF, Fayrene Fearing, is with her.  Previous PCP was Dr. Delford Field who has retired. Her concerns today include:  Patient with history of HTN, prediabetes, obesity, vitamin D deficiency, OSA, discitis/osteomyelitis of thoracic spine, LE edema/DOE (on Furosemide.  Saw cardio  Dr. Bjorn Pippin - neg Cardiac CT -thought to be due to obesity and deconditioning)  HTN: on Cozaar 100 mg daily. Taking consistently.  Did not as yet today.  No device to check BP Limits salt Lasix helps with keeping LE edema down.  Some swelling if she eats salty foods No dizziness but "I feel floating" intermittently for several mths.  Last GFR 74/creat 0.93.  Drinks two 40 oz canister of fluids daily.  Obesity/PreDM: followed by [facility] for about 1 yr.  Does not feel wgh is improving Struggles with eating habits.  Not over eater, "I'm an under eater."  Mainly proteins and veggies.  No sugary drinks.  Was given 1 mth samples of Wegovy last yr by her cardiologist; did well with it but insurance would not cover  Ambulates with walker since discitis/T-vertebral compression 01/2021.   On Zoloft for mild depression that occurred after losing her mom and her pet.  Doing well on the med  HM: Due for colon CA screen, due for PAP (can not lay on table to have this done since discitis; declines for now), Shingrix.   Patient Active Problem List   Diagnosis Date Noted   Somnolence 06/12/2023   Edema of both lower extremities 06/12/2023   Left Leg cramp 03/20/2023   Depression 03/20/2023   BMI 60.0-69.9, adult (HCC) 03/20/2023   Prediabetes 01/15/2023   Other hyperlipidemia 01/15/2023   Hyperglycemia 01/01/2023   Vitamin D deficiency 01/01/2023   OSA  (obstructive sleep apnea) 01/01/2023   Other fatigue 01/01/2023   Dyspnea on exertion 01/01/2023   Depression screening 01/01/2023   Obesity, Beginning BMI 66.1 01/01/2023   Situational depression 04/12/2022   Snoring 12/13/2021   Hypertension 09/13/2021   Osteomyelitis of thoracic vertebra (HCC) 01/17/2021     Current Outpatient Medications on File Prior to Visit  Medication Sig Dispense Refill   furosemide (LASIX) 20 MG tablet Take 1 tablet (20 mg total) by mouth daily. 90 tablet 2   gabapentin (NEURONTIN) 300 MG capsule Take 2 capsules (600 mg total) by mouth 2 (two) times daily. 360 capsule 0   Glucosamine-Chondroit-Vit C-Mn (GLUCOSAMINE 1500 COMPLEX) CAPS Take 2 tablets daily     ibuprofen (ADVIL) 200 MG tablet Take 200 mg by mouth every 6 (six) hours as needed.     losartan (COZAAR) 100 MG tablet Take 1 tablet (100 mg total) by mouth daily. 90 tablet 3   methocarbamol (ROBAXIN) 500 MG tablet Take 1 tablet (500 mg total) by mouth every 8 (eight) hours as needed for muscle spasms. 90 tablet 1   Multiple Vitamins-Minerals (ONE-A-DAY WOMENS PETITES PO) Take 1 tablet by mouth daily.     sertraline (ZOLOFT) 50 MG tablet Take 1 tablet (50 mg total) by mouth daily. 90 tablet 1   Vitamin D, Ergocalciferol, (DRISDOL) 1.25 MG (50000 UNIT) CAPS capsule Take 1 capsule (50,000 Units total) by mouth every 7 (seven) days. 5 capsule 0   albuterol (VENTOLIN HFA) 108 (90 Base)  MCG/ACT inhaler Inhale 2 puffs into the lungs every 4 (four) hours as needed for wheezing or shortness of breath. 6.7 g 2   No current facility-administered medications on file prior to visit.    Allergies  Allergen Reactions   Okra Hives   Other Swelling    Bees and wasps    Social History   Socioeconomic History   Marital status: Media planner    Spouse name: Not on file   Number of children: 0   Years of education: Not on file   Highest education level: Master's degree (e.g., MA, MS, MEng, MEd, MSW, MBA)   Occupational History   Occupation: retail at [employer]  Tobacco Use   Smoking status: Never   Smokeless tobacco: Never  Vaping Use   Vaping status: Never Used  Substance and Sexual Activity   Alcohol use: Not Currently    Comment: rare   Drug use: No   Sexual activity: Not on file  Other Topics Concern   Not on file  Social History Narrative   Not on file   Social Drivers of Health   Financial Resource Strain: Medium Risk (02/27/2023)   Overall Financial Resource Strain (CARDIA)    Difficulty of Paying Living Expenses: Somewhat hard  Food Insecurity: No Food Insecurity (11/16/2023)   Hunger Vital Sign    Worried About Running Out of Food in the Last Year: Never true    Ran Out of Food in the Last Year: Never true  Transportation Needs: No Transportation Needs (11/15/2023)   PRAPARE - Administrator, Civil Service (Medical): No    Lack of Transportation (Non-Medical): No  Physical Activity: Inactive (11/16/2023)   Exercise Vital Sign    Days of Exercise per Week: 0 days    Minutes of Exercise per Session: 0 min  Stress: Stress Concern Present (11/16/2023)   [employer] of Occupational Health - Occupational Stress Questionnaire    Feeling of Stress : Rather much  Social Connections: Socially Isolated (11/16/2023)   Social Connection and Isolation Panel [NHANES]    Frequency of Communication with Friends and Family: Once a week    Frequency of Social Gatherings with Friends and Family: Once a week    Attends Religious Services: Never    Database administrator or Organizations: No    Attends Banker Meetings: Never    Marital Status: Living with partner  Intimate Partner Violence: Not At Risk (11/16/2023)   Humiliation, Afraid, Rape, and Kick questionnaire    Fear of Current or Ex-Partner: No    Emotionally Abused: No    Physically Abused: No    Sexually Abused: No    Family History  Problem Relation Age of Onset   Heart failure  Mother    Chronic Renal Failure Mother    Diabetes Mother    Breast cancer Cousin     Past Surgical History:  Procedure Laterality Date   IR FLUORO GUIDED NEEDLE PLC ASPIRATION/INJECTION LOC  01/18/2021   RADIOLOGY WITH ANESTHESIA N/A 01/18/2021   Procedure: IR WITH ANESTHESIA;  Surgeon: Radiologist, Medication, MD;  Location: [facility] OR;  Service: Radiology;  Laterality: N/A;   WISDOM TOOTH EXTRACTION      ROS: Review of Systems Negative except as stated above  PHYSICAL EXAM: BP (!) 152/78   Pulse 94   Temp 98 F (36.7 C) (Oral)   Ht 4\' 9"  (1.448 m)   Wt (!) 302 lb (137 kg)   SpO2 94%  BMI 65.35 kg/m   Wt Readings from Last 3 Encounters:  11/16/23 (!) 302 lb (137 kg)  09/24/23 293 lb (132.9 kg)  08/23/23 291 lb (132 kg)    Physical Exam  General appearance - alert, well appearing, obese middle age caucasian female and in no distress Mental status - normal mood, behavior, speech, dress, motor activity, and thought processes Neck - supple, no significant adenopathy Chest - clear to auscultation, no wheezes, rales or rhonchi, symmetric air entry Heart - normal rate, regular rhythm, normal S1, S2, no murmurs, rubs, clicks or gallops Extremities - peripheral pulses normal, no pedal edema, no clubbing or cyanosis Has walker with her.  The 10-year ASCVD risk score (Arnett DK, et al., 2019) is: 3.9%   Values used to calculate the score:     Age: 42 years     Sex: Female     Is Non-Hispanic African American: No     Diabetic: No     Tobacco smoker: No     Systolic Blood Pressure: 152 mmHg     Is BP treated: Yes     HDL Cholesterol: 38 mg/dL     Total Cholesterol: 173 mg/dL     1/32/4401    0:27 AM 02/28/2023   11:04 AM 04/12/2022    8:50 AM  Depression screen PHQ 2/9  Decreased Interest 1 1 0  Down, Depressed, Hopeless 1 1 1   PHQ - 2 Score 2 2 1   Altered sleeping 3 1 2   Tired, decreased energy 2 2 0  Change in appetite 2 1 0  Feeling bad or failure about yourself  1 1  2   Trouble concentrating 1 2 1   Moving slowly or fidgety/restless 2 1 0  Suicidal thoughts 0 0 0  PHQ-9 Score 13 10 6   Difficult doing work/chores Somewhat difficult        11/16/2023    8:46 AM 02/28/2023   11:05 AM 04/12/2022    8:51 AM 12/13/2021    8:58 AM  GAD 7 : Generalized Anxiety Score  Nervous, Anxious, on Edge 1 1 2 1   Control/stop worrying 1 1 2 1   Worry too much - different things 1 1 2 1   Trouble relaxing 2 1 2 1   Restless 1 1 1  0  Easily annoyed or irritable 0 1 1 0  Afraid - awful might happen 1 2 1 1   Total GAD 7 Score 7 8 11 5   Anxiety Difficulty Somewhat difficult           Latest Ref Rng & Units 07/17/2023    9:06 AM 06/12/2023    8:09 AM 01/01/2023    9:28 AM  CMP  Glucose 70 - 99 mg/dL 253  664  403   BUN 6 - 24 mg/dL 19  16  7    Creatinine 0.57 - 1.00 mg/dL 4.74  2.59  5.63   Sodium 134 - 144 mmol/L 140  143  140   Potassium 3.5 - 5.2 mmol/L 4.5  4.2  4.1   Chloride 96 - 106 mmol/L 103  107  101   CO2 20 - 29 mmol/L 25  23  20    Calcium 8.7 - 10.2 mg/dL 9.5  9.0  9.8   Total Protein 6.0 - 8.5 g/dL  6.5  6.8   Total Bilirubin 0.0 - 1.2 mg/dL  0.2  0.3   Alkaline Phos 44 - 121 IU/L  94  99   AST 0 - 40 IU/L  17  30   ALT 0 - 32 IU/L  18  27    Lipid Panel     Component Value Date/Time   CHOL 173 06/12/2023 0809   TRIG 91 06/12/2023 0809   HDL 38 (L) 06/12/2023 0809   LDLCALC 118 (H) 06/12/2023 0809    CBC    Component Value Date/Time   WBC 6.3 01/01/2023 0928   WBC 8.0 03/24/2021 0933   RBC 4.51 01/01/2023 0928   RBC 4.79 03/24/2021 0933   HGB 13.5 01/01/2023 0928   HCT 41.1 01/01/2023 0928   PLT 356 01/01/2023 0928   MCV 91 01/01/2023 0928   MCH 29.9 01/01/2023 0928   MCH 29.4 03/24/2021 0933   MCHC 32.8 01/01/2023 0928   MCHC 33.6 03/24/2021 0933   RDW 13.0 01/01/2023 0928   LYMPHSABS 1.4 01/01/2023 0928   MONOABS 0.3 02/28/2021 1721   EOSABS 0.3 01/01/2023 0928   BASOSABS 0.1 01/01/2023 0928    ASSESSMENT AND PLAN: 1.  Essential hypertension (Primary) Not at goal.  She has not taken Cozaar as yet for the morning.  She will do so when she returns home.  Continue furosemide.  2. Morbid obesity (HCC) Remains a struggle and challenge for her.  Encouraged her to continue trying to eat healthy.  She would be a good candidate for GLP-1 agonist but unfortunately looks like this is not covered by her insurance.  3. Prediabetes - metFORMIN (GLUCOPHAGE) 500 MG tablet; Take 1 tablet (500 mg total) by mouth 3 (three) times daily.  Dispense: 90 tablet; Refill: 3  4. Vitamin D deficiency Continue high-dose vitamin D supplement  5. Major depressive disorder, recurrent episode, mild (HCC) Stable on Zoloft  6. Screening for colon cancer Discussed colon cancer screening methods.  Agreeable to doing Cologuard - Cologuard  7. Need for shingles vaccine Given today.  Since pt can not lay flat for pap, I will inquire from GYN whether self swab for HPV would be sufficient for screening.     Patient was given the opportunity to ask questions.  Patient verbalized understanding of the plan and was able to repeat key elements of the plan.   This documentation was completed using Paediatric nurse.  Any transcriptional errors are unintentional.  Orders Placed This Encounter  Procedures   Varicella-zoster vaccine IM   Cologuard     Requested Prescriptions   Signed Prescriptions Disp Refills   metFORMIN (GLUCOPHAGE) 500 MG tablet 90 tablet 3    Sig: Take 1 tablet (500 mg total) by mouth 3 (three) times daily.    Return in about 4 months (around 03/15/2024).  Jonah Blue, MD, FACP

## 2023-11-20 ENCOUNTER — Ambulatory Visit (INDEPENDENT_AMBULATORY_CARE_PROVIDER_SITE_OTHER): Payer: Commercial Managed Care - HMO | Admitting: Family Medicine

## 2023-11-20 ENCOUNTER — Encounter (INDEPENDENT_AMBULATORY_CARE_PROVIDER_SITE_OTHER): Payer: Self-pay | Admitting: Family Medicine

## 2023-11-20 VITALS — BP 138/87 | HR 75 | Temp 97.5°F | Ht <= 58 in | Wt 298.0 lb

## 2023-11-20 DIAGNOSIS — I10 Essential (primary) hypertension: Secondary | ICD-10-CM

## 2023-11-20 DIAGNOSIS — R7989 Other specified abnormal findings of blood chemistry: Secondary | ICD-10-CM | POA: Diagnosis not present

## 2023-11-20 DIAGNOSIS — E785 Hyperlipidemia, unspecified: Secondary | ICD-10-CM

## 2023-11-20 DIAGNOSIS — E669 Obesity, unspecified: Secondary | ICD-10-CM

## 2023-11-20 DIAGNOSIS — R7303 Prediabetes: Secondary | ICD-10-CM | POA: Diagnosis not present

## 2023-11-20 DIAGNOSIS — E559 Vitamin D deficiency, unspecified: Secondary | ICD-10-CM

## 2023-11-20 DIAGNOSIS — E7849 Other hyperlipidemia: Secondary | ICD-10-CM

## 2023-11-20 DIAGNOSIS — Z6841 Body Mass Index (BMI) 40.0 and over, adult: Secondary | ICD-10-CM

## 2023-11-20 MED ORDER — VITAMIN D (ERGOCALCIFEROL) 1.25 MG (50000 UNIT) PO CAPS
50000.0000 [IU] | ORAL_CAPSULE | ORAL | 0 refills | Status: DC
Start: 2023-11-20 — End: 2023-12-26

## 2023-11-20 NOTE — Progress Notes (Signed)
 .smr  Office: [phone]  /  Fax: [phone]  WEIGHT SUMMARY AND BIOMETRICS  Anthropometric Measurements Height: 4' 9 (1.448 m) Weight: 298 lb (135.2 kg) BMI (Calculated): 64.47 Weight at Last Visit: 293 lb Weight Lost Since Last Visit: 0 Weight Gained Since Last Visit: 5 lb Starting Weight: 305 lb Total Weight Loss (lbs): 7 lb (3.175 kg)   Body Composition  Body Fat %: 63.5 % Fat Mass (lbs): 189.4 lbs Muscle Mass (lbs): 103.4 lbs Visceral Fat Rating : 30   Other Clinical Data Fasting: No Labs: Yes Today's Visit #: 11 Starting Date: 01/01/23    Chief Complaint: OBESITY    History of Present Illness   Stacey Kaiser is a 53 year old female who presents for follow-up on obesity and vitamin D  deficiency.  She has gained five pounds since her last visit two months ago, coinciding with the holiday season. She follows a pescetarian eating plan about 65% of the time, using it as a general guideline rather than a structured plan. Exercise consists of walking for about ten minutes, seven times per week. She experiences high stress levels, which may lead to increased comfort eating and snacking, particularly later in the day. She often skips breakfast and struggles with eating three meals a day, stating, 'I don't care until I feel like those hunger pains.'  She is on prescription vitamin D , 50,000 IU weekly, for vitamin D  deficiency. Her most recent vitamin D  level was at goal in August of last year.  She has a history of hypertension and is on losartan  100 mg daily. Her initial blood pressure reading in the office was 153/84 mmHg, with a repeat measurement of 138/87 mmHg.  She has a history of prediabetes, with her last hemoglobin A1c recorded at 5.9%.  She also has hyperlipidemia, with previous cholesterol levels showing an LDL of 118 mg/dL, down from 870 mg/dL, and triglycerides that were noted to be very good.          PHYSICAL EXAM:  Blood pressure  138/87, pulse 75, temperature (!) 97.5 F (36.4 C), height 4' 9 (1.448 m), weight 298 lb (135.2 kg), SpO2 95%. Body mass index is 64.49 kg/m.  DIAGNOSTIC DATA REVIEWED:  BMET    Component Value Date/Time   NA 140 07/17/2023 0906   K 4.5 07/17/2023 0906   CL 103 07/17/2023 0906   CO2 25 07/17/2023 0906   GLUCOSE 107 (H) 07/17/2023 0906   GLUCOSE 109 (H) 05/03/2021 0922   BUN 19 07/17/2023 0906   CREATININE 0.93 07/17/2023 0906   CREATININE 1.07 (H) 05/03/2021 0922   CALCIUM 9.5 07/17/2023 0906   GFRNONAA >60 02/28/2021 1721   Lab Results  Component Value Date   HGBA1C 5.9 (H) 06/12/2023   HGBA1C 5.7 (H) 01/17/2021   Lab Results  Component Value Date   INSULIN  23.5 06/12/2023   INSULIN  20.1 01/01/2023   Lab Results  Component Value Date   TSH 4.980 (H) 07/17/2023   CBC    Component Value Date/Time   WBC 6.3 01/01/2023 0928   WBC 8.0 03/24/2021 0933   RBC 4.51 01/01/2023 0928   RBC 4.79 03/24/2021 0933   HGB 13.5 01/01/2023 0928   HCT 41.1 01/01/2023 0928   PLT 356 01/01/2023 0928   MCV 91 01/01/2023 0928   MCH 29.9 01/01/2023 0928   MCH 29.4 03/24/2021 0933   MCHC 32.8 01/01/2023 0928   MCHC 33.6 03/24/2021 0933   RDW 13.0 01/01/2023 0928   Iron Studies No  results found for: IRON, TIBC, FERRITIN, IRONPCTSAT Lipid Panel     Component Value Date/Time   CHOL 173 06/12/2023 0809   TRIG 91 06/12/2023 0809   HDL 38 (L) 06/12/2023 0809   LDLCALC 118 (H) 06/12/2023 0809   Hepatic Function Panel     Component Value Date/Time   PROT 6.5 06/12/2023 0809   ALBUMIN 3.9 06/12/2023 0809   AST 17 06/12/2023 0809   ALT 18 06/12/2023 0809   ALKPHOS 94 06/12/2023 0809   BILITOT 0.2 06/12/2023 0809      Component Value Date/Time   TSH 4.980 (H) 07/17/2023 0906   Nutritional Lab Results  Component Value Date   VD25OH 56.1 06/12/2023   VD25OH 37.8 01/01/2023   VD25OH 36 03/24/2021     Assessment and Plan    Obesity Gained five pounds since  last visit two months ago. Following pescetarian diet 65% of the time, walking ten minutes seven times per week. Stress and coping mechanisms, including comfort eating and skipping meals, are contributing factors. Discussed body's defense mechanisms against weight loss, emphasizing importance of not cutting calories too low and ensuring adequate protein intake. Recommended calorie range of 1400-1600 and minimum of 85 grams of protein daily. Suggested using My Fitness Pal app or paper journal to track intake. - Set a calorie range of 1400-1600 calories per day. - Ensure a minimum of 85 grams of protein per day. - Use My Fitness Pal app or a paper journal to track food intake, calories, and protein. - Follow up in four weeks.  Hypertension Currently on losartan  100 mg daily. Initial blood pressure was 153/84, repeat was 138/87. Monitoring necessary due to potential effects of medication on electrolytes. - Order blood work to check electrolytes.  Prediabetes Previous hemoglobin A1c was 5.9, indicating prediabetes. Monitoring blood sugar levels is essential. - Order blood work to check hemoglobin A1c.  Hyperlipidemia Previous LDL was 118, with a goal of getting it below 100. Triglycerides were good and improving. - Order blood work to check lipid profile.  Vitamin D  Deficiency Currently on prescription vitamin D  50,000 IU weekly. Last vitamin D  level was at goal in August of last year. - Order blood work to check vitamin D  levels.  Elevated TSH Previous thyroid levels were borderline. Reports feeling fatigued and 'blah'. - Order blood work to check thyroid function.  General Health Maintenance Discussed importance of meal planning and portion control, especially with upcoming events like Valentine's Day. Encouraged outdoor activities. - Encourage meal planning and portion control. - Engage in outdoor activities.  Follow-up - Follow up in four weeks.        She was informed of the  importance of frequent follow up visits to maximize her success with intensive lifestyle modifications for her multiple health conditions.    Louann Penton, MD

## 2023-11-21 LAB — CMP14+EGFR
ALT: 15 [IU]/L (ref 0–32)
AST: 15 [IU]/L (ref 0–40)
Albumin: 4.1 g/dL (ref 3.8–4.9)
Alkaline Phosphatase: 95 [IU]/L (ref 44–121)
BUN/Creatinine Ratio: 14 (ref 9–23)
BUN: 12 mg/dL (ref 6–24)
Bilirubin Total: 0.2 mg/dL (ref 0.0–1.2)
CO2: 22 mmol/L (ref 20–29)
Calcium: 8.9 mg/dL (ref 8.7–10.2)
Chloride: 103 mmol/L (ref 96–106)
Creatinine, Ser: 0.83 mg/dL (ref 0.57–1.00)
Globulin, Total: 2.6 g/dL (ref 1.5–4.5)
Glucose: 100 mg/dL — ABNORMAL HIGH (ref 70–99)
Potassium: 3.9 mmol/L (ref 3.5–5.2)
Sodium: 140 mmol/L (ref 134–144)
Total Protein: 6.7 g/dL (ref 6.0–8.5)
eGFR: 84 mL/min/{1.73_m2} (ref >59)

## 2023-11-21 LAB — LIPID PANEL WITH LDL/HDL RATIO
Cholesterol, Total: 177 mg/dL (ref 100–199)
HDL: 43 mg/dL (ref >39)
LDL Chol Calc (NIH): 117 mg/dL — ABNORMAL HIGH (ref 0–99)
LDL/HDL Ratio: 2.7 {ratio} (ref 0.0–3.2)
Triglycerides: 92 mg/dL (ref 0–149)
VLDL Cholesterol Cal: 17 mg/dL (ref 5–40)

## 2023-11-21 LAB — VITAMIN D 25 HYDROXY (VIT D DEFICIENCY, FRACTURES): Vit D, 25-Hydroxy: 68.6 ng/mL (ref 30.0–100.0)

## 2023-11-21 LAB — HEMOGLOBIN A1C
Est. average glucose Bld gHb Est-mCnc: 123 mg/dL
Hgb A1c MFr Bld: 5.9 % — ABNORMAL HIGH (ref 4.8–5.6)

## 2023-11-21 LAB — TSH: TSH: 4.47 u[IU]/mL (ref 0.450–4.500)

## 2023-11-21 LAB — T4, FREE: Free T4: 0.98 ng/dL (ref 0.82–1.77)

## 2023-11-21 LAB — INSULIN, RANDOM: INSULIN: 21.2 u[IU]/mL (ref 2.6–24.9)

## 2023-11-21 LAB — T3: T3, Total: 118 ng/dL (ref 71–180)

## 2023-11-26 ENCOUNTER — Other Ambulatory Visit: Payer: Self-pay | Admitting: Family Medicine

## 2023-11-26 DIAGNOSIS — M4644 Discitis, unspecified, thoracic region: Secondary | ICD-10-CM

## 2023-12-05 ENCOUNTER — Telehealth: Payer: Self-pay | Admitting: Internal Medicine

## 2023-12-05 ENCOUNTER — Other Ambulatory Visit: Payer: Self-pay | Admitting: Family Medicine

## 2023-12-05 ENCOUNTER — Other Ambulatory Visit: Payer: Self-pay | Admitting: Physician Assistant

## 2023-12-05 ENCOUNTER — Encounter: Payer: Self-pay | Admitting: Internal Medicine

## 2023-12-05 ENCOUNTER — Other Ambulatory Visit: Payer: Self-pay | Admitting: Internal Medicine

## 2023-12-05 DIAGNOSIS — M4644 Discitis, unspecified, thoracic region: Secondary | ICD-10-CM

## 2023-12-05 MED ORDER — GABAPENTIN 300 MG PO CAPS
600.0000 mg | ORAL_CAPSULE | Freq: Two times a day (BID) | ORAL | 2 refills | Status: DC
Start: 2023-12-05 — End: 2024-02-12

## 2023-12-05 MED ORDER — METHOCARBAMOL 500 MG PO TABS
500.0000 mg | ORAL_TABLET | Freq: Three times a day (TID) | ORAL | 4 refills | Status: DC | PRN
Start: 2023-12-05 — End: 2024-06-20

## 2023-12-05 MED ORDER — GLUCOSAMINE 1500 COMPLEX PO CAPS: ORAL_CAPSULE | ORAL | 1 refills | Status: DC

## 2023-12-05 NOTE — Telephone Encounter (Signed)
 Called & spoke to the patient. Verified name & DOB. Informed that letter is ready for pick-up starting tomorrow 12/06/2023. Patient expressed verbal understanding.

## 2023-12-05 NOTE — Telephone Encounter (Signed)
 Let patient know that letter to be excused from jury duty is ready for pickup.

## 2023-12-26 ENCOUNTER — Ambulatory Visit (INDEPENDENT_AMBULATORY_CARE_PROVIDER_SITE_OTHER): Payer: Commercial Managed Care - HMO | Admitting: Family Medicine

## 2023-12-26 ENCOUNTER — Encounter (INDEPENDENT_AMBULATORY_CARE_PROVIDER_SITE_OTHER): Payer: Self-pay | Admitting: Family Medicine

## 2023-12-26 VITALS — BP 134/80 | HR 72 | Temp 97.5°F | Ht <= 58 in | Wt 298.0 lb

## 2023-12-26 DIAGNOSIS — R7303 Prediabetes: Secondary | ICD-10-CM

## 2023-12-26 DIAGNOSIS — I10 Essential (primary) hypertension: Secondary | ICD-10-CM

## 2023-12-26 DIAGNOSIS — E559 Vitamin D deficiency, unspecified: Secondary | ICD-10-CM

## 2023-12-26 DIAGNOSIS — Z6841 Body Mass Index (BMI) 40.0 and over, adult: Secondary | ICD-10-CM

## 2023-12-26 DIAGNOSIS — E669 Obesity, unspecified: Secondary | ICD-10-CM | POA: Diagnosis not present

## 2023-12-26 MED ORDER — METFORMIN HCL 500 MG PO TABS
500.0000 mg | ORAL_TABLET | Freq: Three times a day (TID) | ORAL | 0 refills | Status: DC
Start: 2023-12-26 — End: 2024-03-03

## 2023-12-26 MED ORDER — VITAMIN D (ERGOCALCIFEROL) 1.25 MG (50000 UNIT) PO CAPS
50000.0000 [IU] | ORAL_CAPSULE | ORAL | 0 refills | Status: DC
Start: 2023-12-26 — End: 2024-03-03

## 2023-12-26 NOTE — Progress Notes (Signed)
 Office: [phone]  /  Fax: [phone]  WEIGHT SUMMARY AND BIOMETRICS  Anthropometric Measurements Height: 4\' 9"  (1.448 m) Weight: 298 lb (135.2 kg) BMI (Calculated): 64.47 Weight at Last Visit: 298 lb Weight Lost Since Last Visit: 0 Weight Gained Since Last Visit: 0 Starting Weight: 305 lb Total Weight Loss (lbs): 7 lb (3.175 kg)   Body Composition  Body Fat %: 63.2 % Fat Mass (lbs): 188.8 lbs Muscle Mass (lbs): 104.2 lbs Visceral Fat Rating : 30   Other Clinical Data Fasting: No Labs: No Today's Visit #: 12 Starting Date: 01/01/23    Chief Complaint: OBESITY   Discussed the use of AI scribe software for clinical note transcription with the patient, who gave verbal consent to proceed.  History of Present Illness   The patient, with obesity, presents to discuss her treatment plan and follow up on her progress.  She is adhering to a pescetarian eating plan approximately sixty percent of the time and engages in walking for exercise for about ten minutes on most days of the week. She has maintained her weight over the past month since her last visit.  She has a history of prediabetes and is currently on metformin. She is actively working on diet, exercise, and weight loss to manage this condition. She requests a refill of her metformin. She experiences significant burping after taking metformin. Despite missing meals, she has only missed taking her metformin two or three times since her last appointment.  She has a history of hypertension and is on losartan 100 mg. Her blood pressure is controlled today at 134/80.  She has a history of vitamin D deficiency and is on prescription vitamin D 50,000 international units per day. She requests a refill.          PHYSICAL EXAM:  Blood pressure 134/80, pulse 72, temperature (!) 97.5 F (36.4 C), height 4\' 9"  (1.448 m), weight 298 lb (135.2 kg), SpO2 98%. Body mass index is 64.49 kg/m.  DIAGNOSTIC DATA  REVIEWED:  BMET    Component Value Date/Time   NA 140 11/20/2023 0926   K 3.9 11/20/2023 0926   CL 103 11/20/2023 0926   CO2 22 11/20/2023 0926   GLUCOSE 100 (H) 11/20/2023 0926   GLUCOSE 109 (H) 05/03/2021 0922   BUN 12 11/20/2023 0926   CREATININE 0.83 11/20/2023 0926   CREATININE 1.07 (H) 05/03/2021 0922   CALCIUM 8.9 11/20/2023 0926   GFRNONAA >60 02/28/2021 1721   Lab Results  Component Value Date   HGBA1C 5.9 (H) 11/20/2023   HGBA1C 5.7 (H) 01/17/2021   Lab Results  Component Value Date   INSULIN 21.2 11/20/2023   INSULIN 20.1 01/01/2023   Lab Results  Component Value Date   TSH 4.470 11/20/2023   CBC    Component Value Date/Time   WBC 6.3 01/01/2023 0928   WBC 8.0 03/24/2021 0933   RBC 4.51 01/01/2023 0928   RBC 4.79 03/24/2021 0933   HGB 13.5 01/01/2023 0928   HCT 41.1 01/01/2023 0928   PLT 356 01/01/2023 0928   MCV 91 01/01/2023 0928   MCH 29.9 01/01/2023 0928   MCH 29.4 03/24/2021 0933   MCHC 32.8 01/01/2023 0928   MCHC 33.6 03/24/2021 0933   RDW 13.0 01/01/2023 0928   Iron Studies No results found for: "IRON", "TIBC", "FERRITIN", "IRONPCTSAT" Lipid Panel     Component Value Date/Time   CHOL 177 11/20/2023 0926   TRIG 92 11/20/2023 0926   HDL 43 11/20/2023 0926  LDLCALC 117 (H) 11/20/2023 0926   Hepatic Function Panel     Component Value Date/Time   PROT 6.7 11/20/2023 0926   ALBUMIN 4.1 11/20/2023 0926   AST 15 11/20/2023 0926   ALT 15 11/20/2023 0926   ALKPHOS 95 11/20/2023 0926   BILITOT 0.2 11/20/2023 0926      Component Value Date/Time   TSH 4.470 11/20/2023 0926   Nutritional Lab Results  Component Value Date   VD25OH 68.6 11/20/2023   VD25OH 56.1 06/12/2023   VD25OH 37.8 01/01/2023     Assessment and Plan    Obesity She is managing obesity with a pescetarian diet and exercise, maintaining her weight since the last visit. However, she struggles with adequate caloric and protein intake, potentially affecting  metabolism and weight loss. Discussed the importance of meeting nutritional goals to prevent metabolic slowdown and encouraged viewing nutritional intake as essential. Considered weight loss surgery options, including gastric bypass and gastric sleeve, noting similar outcomes and risks. Surgery is not recommended due to her current inadequate food intake. - Continue pescetarian diet and exercise regimen - Aim for 1400-1600 calories per day - Consume at least 85 grams of protein daily - Encourage journaling of food intake to track nutritional goals  Prediabetes Glucose levels have improved to 100 mg/dL, nearing the target of 99 mg/dL. Hemoglobin A1c remains stable. She is on metformin, experiencing gastrointestinal side effects, particularly burping, likely due to taking it on an empty stomach. Emphasized taking metformin with meals to reduce side effects. - Refill metformin prescription - Encourage taking metformin with meals to reduce gastrointestinal side effects  Hypertension Blood pressure is well-controlled at 134/80 mmHg with losartan 100 mg. Discussed the importance of managing blood pressure to prevent complications such as myocardial infarction and cerebrovascular accidents. Explained that hypertension can lead to vascular damage over time. - Continue losartan 100 mg daily  Vitamin D deficiency Vitamin D levels improved from 56 to 68 ng/mL. Recommended adjusting vitamin D intake frequency due to increased natural synthesis in warmer weather. - Refill vitamin D prescription - Adjust vitamin D intake to every two weeks   follow up in 4 weeks         I have personally spent 40 minutes total time today in preparation, patient care, and documentation for this visit, including the following: review of clinical lab tests; review of medical tests/procedures/services.    She was informed of the importance of frequent follow up visits to maximize her success with intensive lifestyle  modifications for her multiple health conditions.    Quillian Quince, MD

## 2024-01-14 LAB — COLOGUARD: COLOGUARD: NEGATIVE

## 2024-01-15 ENCOUNTER — Encounter: Payer: Self-pay | Admitting: Family Medicine

## 2024-02-03 ENCOUNTER — Encounter (INDEPENDENT_AMBULATORY_CARE_PROVIDER_SITE_OTHER): Payer: Self-pay | Admitting: *Deleted

## 2024-02-04 ENCOUNTER — Ambulatory Visit (INDEPENDENT_AMBULATORY_CARE_PROVIDER_SITE_OTHER): Admitting: Family Medicine

## 2024-02-04 ENCOUNTER — Encounter (INDEPENDENT_AMBULATORY_CARE_PROVIDER_SITE_OTHER): Payer: Self-pay

## 2024-02-08 ENCOUNTER — Telehealth

## 2024-02-08 DIAGNOSIS — R3989 Other symptoms and signs involving the genitourinary system: Secondary | ICD-10-CM | POA: Diagnosis not present

## 2024-02-08 MED ORDER — CEPHALEXIN 500 MG PO CAPS
500.0000 mg | ORAL_CAPSULE | Freq: Two times a day (BID) | ORAL | 0 refills | Status: DC
Start: 2024-02-08 — End: 2024-03-03

## 2024-02-08 NOTE — Progress Notes (Signed)

## 2024-02-11 ENCOUNTER — Encounter: Payer: Self-pay | Admitting: Internal Medicine

## 2024-02-12 ENCOUNTER — Other Ambulatory Visit: Payer: Self-pay | Admitting: Physician Assistant

## 2024-02-12 DIAGNOSIS — F330 Major depressive disorder, recurrent, mild: Secondary | ICD-10-CM

## 2024-02-12 DIAGNOSIS — R7989 Other specified abnormal findings of blood chemistry: Secondary | ICD-10-CM

## 2024-02-12 DIAGNOSIS — M4644 Discitis, unspecified, thoracic region: Secondary | ICD-10-CM

## 2024-02-12 DIAGNOSIS — R635 Abnormal weight gain: Secondary | ICD-10-CM

## 2024-02-12 MED ORDER — LOSARTAN POTASSIUM 100 MG PO TABS: 100.0000 mg | ORAL_TABLET | Freq: Every day | ORAL | 3 refills | Status: AC

## 2024-02-12 MED ORDER — FUROSEMIDE 20 MG PO TABS: 20.0000 mg | ORAL_TABLET | Freq: Every day | ORAL | 2 refills | Status: DC

## 2024-02-13 MED ORDER — GABAPENTIN 300 MG PO CAPS: 600.0000 mg | ORAL_CAPSULE | Freq: Two times a day (BID) | ORAL | 2 refills | Status: AC

## 2024-03-03 ENCOUNTER — Ambulatory Visit (INDEPENDENT_AMBULATORY_CARE_PROVIDER_SITE_OTHER): Admitting: Family Medicine

## 2024-03-03 ENCOUNTER — Encounter (INDEPENDENT_AMBULATORY_CARE_PROVIDER_SITE_OTHER): Payer: Self-pay | Admitting: Family Medicine

## 2024-03-03 VITALS — BP 107/65 | HR 80 | Temp 97.4°F | Ht <= 58 in | Wt 302.0 lb

## 2024-03-03 DIAGNOSIS — E66813 Obesity, class 3: Secondary | ICD-10-CM

## 2024-03-03 DIAGNOSIS — R7303 Prediabetes: Secondary | ICD-10-CM | POA: Diagnosis not present

## 2024-03-03 DIAGNOSIS — I10 Essential (primary) hypertension: Secondary | ICD-10-CM

## 2024-03-03 DIAGNOSIS — Z6841 Body Mass Index (BMI) 40.0 and over, adult: Secondary | ICD-10-CM

## 2024-03-03 DIAGNOSIS — E559 Vitamin D deficiency, unspecified: Secondary | ICD-10-CM

## 2024-03-03 DIAGNOSIS — R600 Localized edema: Secondary | ICD-10-CM | POA: Diagnosis not present

## 2024-03-03 DIAGNOSIS — E669 Obesity, unspecified: Secondary | ICD-10-CM

## 2024-03-03 MED ORDER — METFORMIN HCL 500 MG PO TABS
500.0000 mg | ORAL_TABLET | Freq: Three times a day (TID) | ORAL | 0 refills | Status: AC
Start: 2024-03-03 — End: ?

## 2024-03-03 MED ORDER — VITAMIN D (ERGOCALCIFEROL) 1.25 MG (50000 UNIT) PO CAPS
50000.0000 [IU] | ORAL_CAPSULE | ORAL | 0 refills | Status: AC
Start: 2024-03-03 — End: ?

## 2024-03-03 NOTE — Progress Notes (Signed)
 Office: [phone]  /  Fax: [phone]  WEIGHT SUMMARY AND BIOMETRICS  Anthropometric Measurements Height: 4\' 9"  (1.448 m) Weight: (!) 302 lb (137 kg) BMI (Calculated): 65.33 Weight at Last Visit: 298 lb Weight Lost Since Last Visit: 0 Weight Gained Since Last Visit: 4 lb Starting Weight: 305 lb Total Weight Loss (lbs): 3 lb (1.361 kg) Peak Weight: 305 lb   Body Composition  Body Fat %: 64.5 % Fat Mass (lbs): 195.2 lbs Muscle Mass (lbs): 102 lbs Visceral Fat Rating : 31   Other Clinical Data Fasting: yes Labs: no Today's Visit #: 13 Starting Date: 01/01/23    Chief Complaint: OBESITY   [name] is a 53 year old female who presents for obesity treatment and progress assessment.  She has been adhering to a pescetarian diet approximately sixty percent of the time and engages in daily walking for ten to fifteen minutes. Despite these efforts, she has gained four pounds since her last clinic visit. Hunger is not a significant issue, although she occasionally skips meals, particularly during periods of stress.  Over the past week, she has experienced significant edema, particularly in her hands and feet, describing them as 'poofy' and 'hard as a drum'. The tops of her feet resemble pillows, and she has difficulty bending her fingers at times. She attempted to manage this by taking an extra dose of furosemide , but the swelling persists. No specific triggers for the edema have been identified, and there are no recent changes in diet or activity that could account for it. She is currently on furosemide  20 mg daily and has been taking an additional dose to manage the edema. She also reports difficulty walking short distances due to the discomfort from the swelling.  She is on metformin  for prediabetes and vitamin D  supplementation for vitamin D  deficiency.      PHYSICAL EXAM:  Blood pressure 107/65, pulse 80, temperature (!)  97.4 F (36.3 C), height 4\' 9"  (1.448 m), weight (!) 302 lb (137 kg), SpO2 94%. Body mass index is 65.35 kg/m.  DIAGNOSTIC DATA REVIEWED:  BMET    Component Value Date/Time   NA 140 11/20/2023 0926   K 3.9 11/20/2023 0926   CL 103 11/20/2023 0926   CO2 22 11/20/2023 0926   GLUCOSE 100 (H) 11/20/2023 0926   GLUCOSE 109 (H) 05/03/2021 0922   BUN 12 11/20/2023 0926   CREATININE 0.83 11/20/2023 0926   CREATININE 1.07 (H) 05/03/2021 0922   CALCIUM 8.9 11/20/2023 0926   GFRNONAA >60 02/28/2021 1721   Lab Results  Component Value Date   HGBA1C 5.9 (H) 11/20/2023   HGBA1C 5.7 (H) 01/17/2021   Lab Results  Component Value Date   INSULIN  21.2 11/20/2023   INSULIN  20.1 01/01/2023   Lab Results  Component Value Date   TSH 4.470 11/20/2023   CBC    Component Value Date/Time   WBC 6.3 01/01/2023 0928   WBC 8.0 03/24/2021 0933   RBC 4.51 01/01/2023 0928   RBC 4.79 03/24/2021 0933   HGB 13.5 01/01/2023 0928   HCT 41.1 01/01/2023 0928   PLT 356 01/01/2023 0928   MCV 91 01/01/2023 0928   MCH 29.9 01/01/2023 0928   MCH 29.4 03/24/2021 0933   MCHC 32.8 01/01/2023 0928   MCHC 33.6 03/24/2021 0933   RDW 13.0 01/01/2023 0928   Iron Studies No results found for: "IRON", "TIBC", "FERRITIN", "IRONPCTSAT" Lipid Panel     Component Value Date/Time   CHOL  177 11/20/2023 0926   TRIG 92 11/20/2023 0926   HDL 43 11/20/2023 0926   LDLCALC 117 (H) 11/20/2023 0926   Hepatic Function Panel     Component Value Date/Time   PROT 6.7 11/20/2023 0926   ALBUMIN 4.1 11/20/2023 0926   AST 15 11/20/2023 0926   ALT 15 11/20/2023 0926   ALKPHOS 95 11/20/2023 0926   BILITOT 0.2 11/20/2023 0926      Component Value Date/Time   TSH 4.470 11/20/2023 0926   Nutritional Lab Results  Component Value Date   VD25OH 68.6 11/20/2023   VD25OH 56.1 06/12/2023   VD25OH 37.8 01/01/2023     Assessment and Plan Assessment & Plan Edema Edema in hands and feet for the past week, with  significant swelling and difficulty bending fingers, likely due to fluid retention. Furosemide  provides partial relief. No new dietary or lifestyle changes identified as triggers. Discussed sodium and simple carbohydrates' role in fluid retention and inflammation. Higher furosemide  doses can affect renal function, so hydration is crucial. - Increase furosemide  to 20 mg twice daily for two days. - Advise on low sodium diet and avoidance of simple carbohydrates. - Implement low inflammation, low carb diet plan to aid in fluid reduction. - Ensure adequate hydration.  Obesity Obesity with recent four-pound weight gain. Adheres to pescetarian diet 60% of the time and walks 10-15 minutes daily. Challenges with hunger and stress-related eating noted. Introduced a strict low carb diet plan to aid in weight loss and fluid reduction, with a 99% success rate if followed correctly. Foods on the plan minimize insulin  production, aiding hunger control and metabolism improvement. - Implement strict low carb diet plan with specified food list. - Continue daily walking regimen. - Reassess weight and dietary adherence at next visit.  Prediabetes Prediabetes managed with metformin . Current dietary habits and weight gain may impact glycemic control. Low carb diet plan expected to assist in blood sugar management by reducing insulin  levels. - Refill metformin  prescription. - Implement low carb diet plan to improve blood sugar control.  Hypertension, well controlled Hypertension well controlled with losartan . Current blood pressure is 107/65 mmHg. - Continue losartan  regimen. - Monitor blood pressure at follow-up visit. - Follow low carb eating plan - Decrease high sodium foods.  Vitamin D  deficiency Vitamin D  deficiency managed with supplementation. No new symptoms reported. - Refill vitamin D  prescription. - Recheck labs in one month to assess vitamin D  levels.    She was informed of the importance of  frequent follow up visits to maximize her success with intensive lifestyle modifications for her multiple health conditions.    Jasmine Mesi, MD

## 2024-03-17 ENCOUNTER — Ambulatory Visit: Payer: Commercial Managed Care - HMO | Admitting: Internal Medicine

## 2024-03-30 ENCOUNTER — Encounter: Payer: Self-pay | Admitting: Internal Medicine

## 2024-03-31 ENCOUNTER — Encounter (INDEPENDENT_AMBULATORY_CARE_PROVIDER_SITE_OTHER): Payer: Self-pay | Admitting: Family Medicine

## 2024-03-31 ENCOUNTER — Ambulatory Visit (INDEPENDENT_AMBULATORY_CARE_PROVIDER_SITE_OTHER): Admitting: Family Medicine

## 2024-03-31 VITALS — BP 134/73 | HR 79 | Temp 98.0°F | Ht <= 58 in | Wt 293.0 lb

## 2024-03-31 DIAGNOSIS — E785 Hyperlipidemia, unspecified: Secondary | ICD-10-CM

## 2024-03-31 DIAGNOSIS — Z6841 Body Mass Index (BMI) 40.0 and over, adult: Secondary | ICD-10-CM

## 2024-03-31 DIAGNOSIS — G479 Sleep disorder, unspecified: Secondary | ICD-10-CM | POA: Diagnosis not present

## 2024-03-31 DIAGNOSIS — E669 Obesity, unspecified: Secondary | ICD-10-CM

## 2024-03-31 DIAGNOSIS — E66813 Body mass index (BMI) 60.0-69.9, adult: Secondary | ICD-10-CM

## 2024-03-31 DIAGNOSIS — R7303 Prediabetes: Secondary | ICD-10-CM | POA: Diagnosis not present

## 2024-03-31 DIAGNOSIS — E7849 Other hyperlipidemia: Secondary | ICD-10-CM

## 2024-03-31 DIAGNOSIS — I10 Essential (primary) hypertension: Secondary | ICD-10-CM

## 2024-03-31 DIAGNOSIS — E559 Vitamin D deficiency, unspecified: Secondary | ICD-10-CM

## 2024-03-31 DIAGNOSIS — G4700 Insomnia, unspecified: Secondary | ICD-10-CM

## 2024-03-31 MED ORDER — TRAZODONE HCL 50 MG PO TABS
25.0000 mg | ORAL_TABLET | Freq: Every evening | ORAL | 0 refills | Status: DC | PRN
Start: 2024-03-31 — End: 2024-04-29

## 2024-03-31 MED ORDER — METFORMIN HCL 500 MG PO TABS
500.0000 mg | ORAL_TABLET | Freq: Three times a day (TID) | ORAL | 0 refills | Status: DC
Start: 2024-03-31 — End: 2024-04-29

## 2024-03-31 MED ORDER — VITAMIN D (ERGOCALCIFEROL) 1.25 MG (50000 UNIT) PO CAPS
50000.0000 [IU] | ORAL_CAPSULE | ORAL | 0 refills | Status: DC
Start: 2024-03-31 — End: 2024-05-12

## 2024-03-31 NOTE — Addendum Note (Signed)
 Addended by: Woodie Hazard on: 03/31/2024 08:14 AM   Modules accepted: Level of Service

## 2024-03-31 NOTE — Progress Notes (Signed)
 Office: [phone]  /  Fax: [phone]  WEIGHT SUMMARY AND BIOMETRICS  Anthropometric Measurements Height: 4' 9 (1.448 m) Weight: 293 lb (132.9 kg) BMI (Calculated): 63.39 Weight at Last Visit: 302 lb Weight Lost Since Last Visit: 9 lb Weight Gained Since Last Visit: 0 Starting Weight: 305 lb Total Weight Loss (lbs): 12 lb (5.443 kg) Peak Weight: 305 lb   Body Composition  Body Fat %: 62 % Fat Mass (lbs): 181.8 lbs Muscle Mass (lbs): 105.8 lbs Visceral Fat Rating : 29   Other Clinical Data Fasting: yes Labs: no Today's Visit #: 14 Starting Date: 01/01/23    Chief Complaint: OBESITY    [name] is a 53 year old female with obesity and prediabetes who presents for a follow-up on her weight loss and prediabetes management.  She has been following a low-carb diet plan for the past month, adhering to it 95% of the time. Her daily walking exercise has increased from ten minutes to twenty minutes, resulting in a nine-pound weight loss over the last month.  In managing her prediabetes, she is reducing simple carbohydrates and increasing exercise. She is currently taking metformin  and requests a refill. She experiences no significant gastrointestinal issues with metformin , stating it's 'probably about the same as before.'  She has a history of vitamin D  deficiency and is being treated with prescription vitamin D . Her most recent vitamin D  level, checked in February, was at goal at 47.6. She takes vitamin D  every other week and requests a refill.  Her blood pressure has been stable, with a recent reading of 134/73. She can feel when her blood pressure spikes, describing symptoms of pain and pressure in her head and eyes. She takes losartan  and notes feeling very tired after taking it. She is working on diet exercise and weight loss to treat her HLD.  She experiences significant fatigue and was diagnosed with mild sleep apnea  about a year ago. She reports difficulty falling asleep, averaging about four hours of sleep per night. She has tried melatonin gummies without success. Her sleep schedule involves naturally waking between 7 and 8 AM, but not falling asleep until 3 or 4 AM.      PHYSICAL EXAM:  Blood pressure 134/73, pulse 79, temperature 98 F (36.7 C), height 4' 9 (1.448 m), weight 293 lb (132.9 kg), SpO2 95%. Body mass index is 63.4 kg/m.  DIAGNOSTIC DATA REVIEWED:  BMET    Component Value Date/Time   NA 140 11/20/2023 0926   K 3.9 11/20/2023 0926   CL 103 11/20/2023 0926   CO2 22 11/20/2023 0926   GLUCOSE 100 (H) 11/20/2023 0926   GLUCOSE 109 (H) 05/03/2021 0922   BUN 12 11/20/2023 0926   CREATININE 0.83 11/20/2023 0926   CREATININE 1.07 (H) 05/03/2021 0922   CALCIUM 8.9 11/20/2023 0926   GFRNONAA >60 02/28/2021 1721   Lab Results  Component Value Date   HGBA1C 5.9 (H) 11/20/2023   HGBA1C 5.7 (H) 01/17/2021   Lab Results  Component Value Date   INSULIN  21.2 11/20/2023   INSULIN  20.1 01/01/2023   Lab Results  Component Value Date   TSH 4.470 11/20/2023   CBC    Component Value Date/Time   WBC 6.3 01/01/2023 0928   WBC 8.0 03/24/2021 0933   RBC 4.51 01/01/2023 0928   RBC 4.79 03/24/2021 0933   HGB 13.5 01/01/2023 0928   HCT 41.1 01/01/2023 0928   PLT 356 01/01/2023 0928   MCV  91 01/01/2023 0928   MCH 29.9 01/01/2023 0928   MCH 29.4 03/24/2021 0933   MCHC 32.8 01/01/2023 0928   MCHC 33.6 03/24/2021 0933   RDW 13.0 01/01/2023 0928   Iron Studies No results found for: IRON, TIBC, FERRITIN, IRONPCTSAT Lipid Panel     Component Value Date/Time   CHOL 177 11/20/2023 0926   TRIG 92 11/20/2023 0926   HDL 43 11/20/2023 0926   LDLCALC 117 (H) 11/20/2023 0926   Hepatic Function Panel     Component Value Date/Time   PROT 6.7 11/20/2023 0926   ALBUMIN 4.1 11/20/2023 0926   AST 15 11/20/2023 0926   ALT 15 11/20/2023 0926   ALKPHOS 95 11/20/2023 0926   BILITOT  0.2 11/20/2023 0926      Component Value Date/Time   TSH 4.470 11/20/2023 0926   Nutritional Lab Results  Component Value Date   VD25OH 68.6 11/20/2023   VD25OH 56.1 06/12/2023   VD25OH 37.8 01/01/2023     Assessment and Plan Assessment & Plan Obesity She has adhered 95% to a low-carb diet for the past month and increased her exercise to 20 minutes of walking daily, resulting in a 9-pound weight loss. She misses dairy and fruit on the diet. Modifying the diet to include yogurt and fruit was discussed, acknowledging potential slower weight loss but still effective if intake is controlled. She agreed to the modification. - Modify low-carb diet to include yogurt and fruit - Continue daily walking for 20 minutes  Prediabetes She is actively preventing diabetes onset by reducing simple carbohydrates and increasing exercise. She is on metformin  and requests a refill. Progress will be monitored with lab tests. - Refill metformin  for 90 days - Order labs to monitor prediabetes  Hyperlipidemia Her LDL levels are improving with weight loss but are not yet at goal. Lipid levels will be monitored with lab tests, and lifestyle modifications will continue. - Order labs to monitor hyperlipidemia - Continue diet, exercise, and weight loss efforts  Hypertension Her blood pressure is well-controlled at 134/73. She reports feeling pain and pressure in her head and eyes when her blood pressure spikes. Losartan  helps reduce her blood pressure but causes fatigue. - Continue current antihypertensive regimen  Vitamin D  deficiency She is being treated with prescription vitamin D , with levels at goal at 68.6 in February. Current treatment will continue. - Refill vitamin D  for 90 days, adjust to every other week dosing  Sleep disturbance She experiences significant fatigue and difficulty falling asleep, averaging 4 hours of sleep per night. She has mild sleep apnea but cannot afford a CPAP machine.  Melatonin was ineffective. Discussed the importance of quality sleep and proposed trazodone for sleep onset. Trazodone is non-addictive and does not cause unusual sleep behaviors. - Prescribe trazodone 50 mg, take 1/2 to 1 pill at night      She was informed of the importance of frequent follow up visits to maximize her success with intensive lifestyle modifications for her multiple health conditions.    Jasmine Mesi, MD

## 2024-04-01 LAB — CMP14+EGFR
ALT: 20 IU/L (ref 0–32)
AST: 19 IU/L (ref 0–40)
Albumin: 4.5 g/dL (ref 3.8–4.9)
Alkaline Phosphatase: 93 IU/L (ref 44–121)
BUN/Creatinine Ratio: 17 (ref 9–23)
BUN: 17 mg/dL (ref 6–24)
Bilirubin Total: 0.4 mg/dL (ref 0.0–1.2)
CO2: 19 mmol/L — ABNORMAL LOW (ref 20–29)
Calcium: 9.5 mg/dL (ref 8.7–10.2)
Chloride: 104 mmol/L (ref 96–106)
Creatinine, Ser: 1.03 mg/dL — ABNORMAL HIGH (ref 0.57–1.00)
Globulin, Total: 2.5 g/dL (ref 1.5–4.5)
Glucose: 107 mg/dL — ABNORMAL HIGH (ref 70–99)
Potassium: 4 mmol/L (ref 3.5–5.2)
Sodium: 141 mmol/L (ref 134–144)
Total Protein: 7 g/dL (ref 6.0–8.5)
eGFR: 65 mL/min/{1.73_m2} (ref >59)

## 2024-04-01 LAB — LIPID PANEL WITH LDL/HDL RATIO
Cholesterol, Total: 152 mg/dL (ref 100–199)
HDL: 40 mg/dL (ref >39)
LDL Chol Calc (NIH): 93 mg/dL (ref 0–99)
LDL/HDL Ratio: 2.3 ratio (ref 0.0–3.2)
Triglycerides: 105 mg/dL (ref 0–149)
VLDL Cholesterol Cal: 19 mg/dL (ref 5–40)

## 2024-04-01 LAB — VITAMIN D 25 HYDROXY (VIT D DEFICIENCY, FRACTURES): Vit D, 25-Hydroxy: 51.8 ng/mL (ref 30.0–100.0)

## 2024-04-01 LAB — HEMOGLOBIN A1C
Est. average glucose Bld gHb Est-mCnc: 117 mg/dL
Hgb A1c MFr Bld: 5.7 % — ABNORMAL HIGH (ref 4.8–5.6)

## 2024-04-01 LAB — INSULIN, RANDOM: INSULIN: 19.9 u[IU]/mL (ref 2.6–24.9)

## 2024-04-01 NOTE — Telephone Encounter (Signed)
 Patient came to the clinic today and dropped off [employer] disability forms. The forms have been placed in Dr. Anselmo Bast box for review on 04/01/2024.

## 2024-04-02 ENCOUNTER — Telehealth: Payer: Self-pay | Admitting: Internal Medicine

## 2024-04-02 NOTE — Telephone Encounter (Signed)
 Give this pt a video visit for form completion tomorrow or Friday morning.Ask her if she has copy of last form that Dr. Brent Cambric completed for her for [employer], please send me a copy of it via Mychart prior to our visit.

## 2024-04-02 NOTE — Telephone Encounter (Signed)
 Called & spoke to the patient. Verified name & DOB. Patient stated that she does not have a copy of the previous form for [employer]. Video visit scheduled for 04/04/2024. Patient confirmed appointment.

## 2024-04-04 ENCOUNTER — Telehealth (HOSPITAL_BASED_OUTPATIENT_CLINIC_OR_DEPARTMENT_OTHER): Admitting: Internal Medicine

## 2024-04-04 DIAGNOSIS — Z8739 Personal history of other diseases of the musculoskeletal system and connective tissue: Secondary | ICD-10-CM

## 2024-04-04 DIAGNOSIS — Z029 Encounter for administrative examinations, unspecified: Secondary | ICD-10-CM

## 2024-04-04 DIAGNOSIS — G8929 Other chronic pain: Secondary | ICD-10-CM

## 2024-04-04 DIAGNOSIS — M545 Low back pain, unspecified: Secondary | ICD-10-CM | POA: Diagnosis not present

## 2024-04-04 NOTE — Progress Notes (Unsigned)
 Patient ID: Stacey Kaiser, female   DOB: 18-May-1971, 53 y.o.   MRN: [id number] Virtual Visit via Video Note  I connected with Stacey Kaiser on 04/04/2024 at 8:12 AM by a video enabled telemedicine application and verified that I am speaking with the correct person using two identifiers.  Location: Patient: home Provider: Office   I discussed the limitations of evaluation and management by telemedicine and the availability of in person appointments. The patient expressed understanding and agreed to proceed.  History of Present Illness: Patient with history of HTN, prediabetes, obesity, vitamin D  deficiency, OSA, discitis/osteomyelitis of thoracic spine, LE edema/DOE (on Furosemide .  Saw cardio  Dr. Kate - neg Cardiac CT -thought to be due to obesity and deconditioning)   Discussed the use of AI scribe software for clinical note transcription with the patient, who gave verbal consent to proceed.  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  She was hospitalized for 8 days at that time and post discharge required long course of antibiotics. 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 She did not initially receive inpatient rehab or outpatient physical therapy due to lack of insurance. Aqua therapy was beneficial, but regular physical therapy is not feasible as she cannot lie on her back. She uses a walker and avoids stairs, kneeling, stooping, crouching, crawling, and reaching overhead or below waist level. She gets short of breath with exertion due to deconditioning and morbid obesity.  She experiences  significant physical limitations, unable to stand for more than 15-20 minutes without support due to pain and pressure in her mid to lower back. The pain, described as a 'bear hug' sensation, wraps around her left side, rib cage, and under her breast, progressing to a stabbing sensation in the mid to lower back. There is no radiation to the legs, but she has tingling in her feet at times.  Currently not able to lift more than 5 pounds and can only walk for about 15 minutes a day without getting winded.  Pain in her lower back gets progressively worse throughout the day.  She is unable to walk up steps without having to hold on to something due to her balance being off.  She is left-handed.  She reports some tingling in the hands intermittently over the past 6 months.  She is able to type on a keyboard and handle a mouse.  She was referred to neurosurgeon Dr. Mavis the end of last year.  She did see him and he ordered an MRI of the thoracic spine to reassess her back pain since she is more than 2 years out from the osteomyelitis/discitis.  She is awaiting insurance approval for an MRI at [facility] after a previous attempt was unsuccessful due to mobile MRI constraints. Her medications include gabapentin , methocarbamol , and ibuprofen.  She takes ibuprofen 200 mg 2 tablets 3 times a day.  She ambulates with a walker to help take the pressure off her back.      Outpatient Encounter Medications as of 04/04/2024  Medication Sig   albuterol  (VENTOLIN  HFA) 108 (90 Base) MCG/ACT inhaler Inhale 2  puffs into the lungs every 4 (four) hours as needed for wheezing or shortness of breath.   furosemide  (LASIX ) 20 MG tablet Take 1 tablet (20 mg total) by mouth daily.   gabapentin  (NEURONTIN ) 300 MG capsule Take 2 capsules (600 mg total) by mouth 2 (two) times daily.   Glucosamine-Chondroit-Vit C-Mn (GLUCOSAMINE 1500 COMPLEX) CAPS Take 2 tablets daily   ibuprofen (ADVIL) 200 MG tablet Take 200 mg by mouth every 6  (six) hours as needed.   losartan  (COZAAR ) 100 MG tablet Take 1 tablet (100 mg total) by mouth daily.   metFORMIN  (GLUCOPHAGE ) 500 MG tablet Take 1 tablet (500 mg total) by mouth 3 (three) times daily.   methocarbamol  (ROBAXIN ) 500 MG tablet Take 1 tablet (500 mg total) by mouth every 8 (eight) hours as needed for muscle spasms.   Multiple Vitamins-Minerals (ONE-A-DAY WOMENS PETITES PO) Take 1 tablet by mouth daily.   sertraline  (ZOLOFT ) 50 MG tablet Take 1 tablet by mouth once daily   traZODone (DESYREL) 50 MG tablet Take 0.5-1 tablets (25-50 mg total) by mouth at bedtime as needed for sleep.   Vitamin D , Ergocalciferol , (DRISDOL ) 1.25 MG (50000 UNIT) CAPS capsule Take 1 capsule (50,000 Units total) by mouth every 7 (seven) days.   No facility-administered encounter medications on file as of 04/04/2024.    Observations/Objective: Middle-age Caucasian female who appears in NAD  Assessment and Plan: 1. Administrative encounter 2. Chronic bilateral low back pain without sciatica (Primary) 3. History of osteomyelitis 4. History of discitis - Patient with chronic bilateral lower back pain worse on the left side since having discitis/osteomyelitis in 2022.  The back pain has significantly impacted her ability to work and her ability to perform regular/routine activities.  I would have like to have had the updated MRI that was ordered by the neurologist Dr. Mavis in January of this year but it has not been done as yet.  I requested that patient touch base with them to see what is the status of this and whether it has been approved by her insurance.  Would like to have this in the chart by the time we have to complete her disability form again for next year.   Follow Up Instructions: As previously scheduled   I discussed the assessment and treatment plan with the patient. The patient was provided an opportunity to ask questions and all were answered. The patient agreed with the plan and  demonstrated an understanding of the instructions.   The patient was advised to call back or seek an in-person evaluation if the symptoms worsen or if the condition fails to improve as anticipated.  I spent 21 minutes dedicated to the care of this patient on the date of this encounter to include previsit review of chart and form, face to face time with pt discussing dx and management.  This note has been created with Education officer, environmental. Any transcriptional errors are unintentional.  Barnie Louder, MD

## 2024-04-06 ENCOUNTER — Encounter: Payer: Self-pay | Admitting: Internal Medicine

## 2024-04-12 ENCOUNTER — Other Ambulatory Visit: Payer: Self-pay | Admitting: Internal Medicine

## 2024-04-12 DIAGNOSIS — F330 Major depressive disorder, recurrent, mild: Secondary | ICD-10-CM

## 2024-04-29 ENCOUNTER — Encounter: Payer: Self-pay | Admitting: Internal Medicine

## 2024-04-29 ENCOUNTER — Ambulatory Visit: Attending: Internal Medicine | Admitting: Internal Medicine

## 2024-04-29 VITALS — BP 118/77 | HR 84 | Temp 98.0°F | Ht <= 58 in | Wt 298.0 lb

## 2024-04-29 DIAGNOSIS — F330 Major depressive disorder, recurrent, mild: Secondary | ICD-10-CM

## 2024-04-29 DIAGNOSIS — I10 Essential (primary) hypertension: Secondary | ICD-10-CM

## 2024-04-29 DIAGNOSIS — G4700 Insomnia, unspecified: Secondary | ICD-10-CM

## 2024-04-29 DIAGNOSIS — M545 Low back pain, unspecified: Secondary | ICD-10-CM

## 2024-04-29 DIAGNOSIS — E559 Vitamin D deficiency, unspecified: Secondary | ICD-10-CM

## 2024-04-29 DIAGNOSIS — Z6841 Body Mass Index (BMI) 40.0 and over, adult: Secondary | ICD-10-CM

## 2024-04-29 DIAGNOSIS — R7303 Prediabetes: Secondary | ICD-10-CM | POA: Diagnosis not present

## 2024-04-29 DIAGNOSIS — Z23 Encounter for immunization: Secondary | ICD-10-CM

## 2024-04-29 DIAGNOSIS — G8929 Other chronic pain: Secondary | ICD-10-CM

## 2024-04-29 MED ORDER — TRAZODONE HCL 50 MG PO TABS
25.0000 mg | ORAL_TABLET | Freq: Every evening | ORAL | 4 refills | Status: AC | PRN
Start: 2024-04-29 — End: ?

## 2024-04-29 MED ORDER — METFORMIN HCL 500 MG PO TABS: 500.0000 mg | ORAL_TABLET | Freq: Three times a day (TID) | ORAL | 6 refills | Status: AC

## 2024-04-29 MED ORDER — SERTRALINE HCL 50 MG PO TABS: 50.0000 mg | ORAL_TABLET | Freq: Every day | ORAL | 1 refills | Status: DC

## 2024-04-29 NOTE — Progress Notes (Signed)
 Patient ID: Stacey Kaiser, female    DOB: Jun 10, 1971  MRN: [id number]  CC: Hypertension (HTN f/u. Thompson if vit D is still needed /Difficulty falling asleep - staying asleep with trazadone Gerrianne vax administered 04/29/24 - C.A.)   Subjective: Stacey Kaiser is a 53 y.o. female who presents for chronic ds management. Lynwood, her boyfriend, is with her. Her concerns today include:  Patient with history of HTN, prediabetes, obesity, vitamin D  deficiency, OSA, discitis/osteomyelitis of thoracic spine, LE edema/DOE (on Furosemide .  Saw cardio  Dr. Kate - neg Cardiac CT -thought to be due to obesity and deconditioning)   Discussed the use of AI scribe software for clinical note transcription with the patient, who gave verbal consent to proceed.  [name] is a 53 year old female who presents for follow-up and ongoing management of her medical conditions.  Chronic back pain/history of discitis thoracic spine: Since we last spoke, she reportedly touch base with Dr. Mavis office regarding the MRI.  She was told that there was still awaiting insurance approval for a repeat MRI of her back, which has been pending since January. She attempted a mobile MRI but found it too claustrophobic.   HTN: She is currently on Cozaar  100 mg daily and furosemide  daily for blood pressure management. No more swelling than usual in the legs and experiences shortness of breath only with exertion, but no chest pain.  Obesity/prediabetes: She is under medical weight management with metformin  500 mg three times a day. She reports slow weight loss and is following a low carbohydrate diet, recently reintroducing yogurt and fruit. She walks about 15 minutes daily, using a walker due to pain.  Vitamin D  deficiency: She is on high-dose vitamin D  every other week, adjusted from weekly after her levels were found to be adequate. Her last vitamin D  level was  51.  Insomnia: She has difficulty falling asleep.  She was recently prescribed trazodone  by Dr. Verdon to take as needed, which helps her stay asleep but not fall asleep faster. She has tried melatonin without success and maintains a consistent bedtime routine.  Normally gets in bed between 10 to 11 PM.  The TV eventually goes off.  She sometimes sleeps with calming music on.  Does not fall asleep until around 3:30.  She does not drink any alcoholic beverages or caffeinated beverages at bedtime.  MDD: She has a history of depression, currently managed with Zoloft . She reports improvement in her symptoms, attributing past exacerbations to medication side effects and personal stressors.  HM: Due for shingles vaccine and is agreeable to receiving hepatitis B vaccine series.  We are currently out of the hepatitis B vaccine.    Patient Active Problem List   Diagnosis Date Noted   Elevated TSH 11/20/2023   Somnolence 06/12/2023   Edema of both lower extremities 06/12/2023   Left Leg cramp 03/20/2023   Depression 03/20/2023   BMI 60.0-69.9, adult (HCC) 03/20/2023   Prediabetes 01/15/2023   Other hyperlipidemia 01/15/2023   Hyperglycemia 01/01/2023   Vitamin D  deficiency 01/01/2023   OSA (obstructive sleep apnea) 01/01/2023   Other fatigue 01/01/2023   Dyspnea on exertion 01/01/2023   Depression screening 01/01/2023   Obesity, Beginning BMI 66.1 01/01/2023   Situational depression 04/12/2022   Snoring 12/13/2021   Hypertension 09/13/2021   Osteomyelitis of thoracic vertebra (HCC) 01/17/2021     Current Outpatient Medications on File Prior to Visit  Medication Sig Dispense Refill  albuterol  (VENTOLIN  HFA) 108 (90 Base) MCG/ACT inhaler Inhale 2 puffs into the lungs every 4 (four) hours as needed for wheezing or shortness of breath. 6.7 g 2   furosemide  (LASIX ) 20 MG tablet Take 1 tablet (20 mg total) by mouth daily. 90 tablet 2   gabapentin  (NEURONTIN ) 300 MG capsule Take 2 capsules (600  mg total) by mouth 2 (two) times daily. 360 capsule 2   Glucosamine-Chondroit-Vit C-Mn (GLUCOSAMINE 1500 COMPLEX) CAPS Take 2 tablets daily 180 capsule 1   ibuprofen (ADVIL) 200 MG tablet Take 200 mg by mouth every 6 (six) hours as needed.     losartan  (COZAAR ) 100 MG tablet Take 1 tablet (100 mg total) by mouth daily. 90 tablet 3   methocarbamol  (ROBAXIN ) 500 MG tablet Take 1 tablet (500 mg total) by mouth every 8 (eight) hours as needed for muscle spasms. 90 tablet 4   Multiple Vitamins-Minerals (ONE-A-DAY WOMENS PETITES PO) Take 1 tablet by mouth daily.     Vitamin D , Ergocalciferol , (DRISDOL ) 1.25 MG (50000 UNIT) CAPS capsule Take 1 capsule (50,000 Units total) by mouth every 7 (seven) days. 5 capsule 0   No current facility-administered medications on file prior to visit.    Allergies  Allergen Reactions   Okra Hives   Other Swelling    Bees and wasps    Social History   Socioeconomic History   Marital status: Media planner    Spouse name: Not on file   Number of children: 0   Years of education: Not on file   Highest education level: Master's degree (e.g., MA, MS, MEng, MEd, MSW, MBA)  Occupational History   Occupation: retail at [employer]  Tobacco Use   Smoking status: Never   Smokeless tobacco: Never  Vaping Use   Vaping status: Never Used  Substance and Sexual Activity   Alcohol use: Not Currently    Comment: rare   Drug use: No   Sexual activity: Not on file  Other Topics Concern   Not on file  Social History Narrative   Not on file   Social Drivers of Health   Financial Resource Strain: Medium Risk (04/28/2024)   Overall Financial Resource Strain (CARDIA)    Difficulty of Paying Living Expenses: Somewhat hard  Food Insecurity: Food Insecurity Present (04/28/2024)   Hunger Vital Sign    Worried About Running Out of Food in the Last Year: Sometimes true    Ran Out of Food in the Last Year: Sometimes true  Transportation Needs: No Transportation  Needs (04/28/2024)   PRAPARE - Administrator, Civil Service (Medical): No    Lack of Transportation (Non-Medical): No  Physical Activity: Inactive (04/28/2024)   Exercise Vital Sign    Days of Exercise per Week: 0 days    Minutes of Exercise per Session: Not on file  Stress: Stress Concern Present (04/28/2024)   [employer] of Occupational Health - Occupational Stress Questionnaire    Feeling of Stress: To some extent  Social Connections: Unknown (04/28/2024)   Social Connection and Isolation Panel    Frequency of Communication with Friends and Family: Once a week    Frequency of Social Gatherings with Friends and Family: Patient declined    Attends Religious Services: Not on Insurance claims handler of Clubs or Organizations: No    Attends Banker Meetings: Not on file    Marital Status: Living with partner  Intimate Partner Violence: Not At Risk (11/16/2023)  Humiliation, Afraid, Rape, and Kick questionnaire    Fear of Current or Ex-Partner: No    Emotionally Abused: No    Physically Abused: No    Sexually Abused: No    Family History  Problem Relation Age of Onset   Heart failure Mother    Chronic Renal Failure Mother    Diabetes Mother    Breast cancer Cousin     Past Surgical History:  Procedure Laterality Date   IR FLUORO GUIDED NEEDLE PLC ASPIRATION/INJECTION LOC  01/18/2021   RADIOLOGY WITH ANESTHESIA N/A 01/18/2021   Procedure: IR WITH ANESTHESIA;  Surgeon: Radiologist, Medication, MD;  Location: [facility] OR;  Service: Radiology;  Laterality: N/A;   WISDOM TOOTH EXTRACTION      ROS: Review of Systems Negative except as stated above  PHYSICAL EXAM: BP 118/77 (BP Location: Left Arm, Patient Position: Sitting, Cuff Size: Large)   Pulse 84   Temp 98 F (36.7 C) (Oral)   Ht 4' 9 (1.448 m)   Wt 298 lb (135.2 kg)   SpO2 95%   BMI 64.49 kg/m   Wt Readings from Last 3 Encounters:  04/29/24 298 lb (135.2 kg)  03/31/24 293 lb (132.9 kg)   03/03/24 (!) 302 lb (137 kg)    Physical Exam  General appearance - alert, well appearing, obese middle-age Caucasian female and in no distress.  She has her walker with her. Mental status - normal mood, behavior, speech, dress, motor activity, and thought processes Chest - clear to auscultation, no wheezes, rales or rhonchi, symmetric air entry Heart - normal rate, regular rhythm, normal S1, S2, no murmurs, rubs, clicks or gallops Extremities - no LE pitting edema      Latest Ref Rng & Units 03/31/2024    8:02 AM 11/20/2023    9:26 AM 07/17/2023    9:06 AM  CMP  Glucose 70 - 99 mg/dL 892  899  892   BUN 6 - 24 mg/dL 17  12  19    Creatinine 0.57 - 1.00 mg/dL 8.96  9.16  9.06   Sodium 134 - 144 mmol/L 141  140  140   Potassium 3.5 - 5.2 mmol/L 4.0  3.9  4.5   Chloride 96 - 106 mmol/L 104  103  103   CO2 20 - 29 mmol/L 19  22  25    Calcium 8.7 - 10.2 mg/dL 9.5  8.9  9.5   Total Protein 6.0 - 8.5 g/dL 7.0  6.7    Total Bilirubin 0.0 - 1.2 mg/dL 0.4  0.2    Alkaline Phos 44 - 121 IU/L 93  95    AST 0 - 40 IU/L 19  15    ALT 0 - 32 IU/L 20  15     Lipid Panel     Component Value Date/Time   CHOL 152 03/31/2024 0802   TRIG 105 03/31/2024 0802   HDL 40 03/31/2024 0802   LDLCALC 93 03/31/2024 0802    CBC    Component Value Date/Time   WBC 6.3 01/01/2023 0928   WBC 8.0 03/24/2021 0933   RBC 4.51 01/01/2023 0928   RBC 4.79 03/24/2021 0933   HGB 13.5 01/01/2023 0928   HCT 41.1 01/01/2023 0928   PLT 356 01/01/2023 0928   MCV 91 01/01/2023 0928   MCH 29.9 01/01/2023 0928   MCH 29.4 03/24/2021 0933   MCHC 32.8 01/01/2023 0928   MCHC 33.6 03/24/2021 0933   RDW 13.0 01/01/2023 0928   LYMPHSABS 1.4 01/01/2023  9071   MONOABS 0.3 02/28/2021 1721   EOSABS 0.3 01/01/2023 0928   BASOSABS 0.1 01/01/2023 0928    ASSESSMENT AND PLAN: 1. Essential hypertension (Primary) At goal.  Continue Cozaar  100 mg daily and furosemide .  2. Morbid obesity (HCC) 3. Prediabetes Encouraged  her to continue trying to eat healthy.  Followed by medical weight management. - metFORMIN  (GLUCOPHAGE ) 500 MG tablet; Take 1 tablet (500 mg total) by mouth 3 (three) times daily.  Dispense: 90 tablet; Refill: 6  4. Insomnia, unspecified type Good sleep hygiene discussed and encouraged. Patient advised not to drink any caffeinated beverages or excessive alcohol use within several hours of bedtime.  Advised to get in bed around about the same time every night.  Once in bed, turn off all lights and sounds.  If unable to fall asleep within 30 to 45 minutes of getting in bed, patient should get up and try to do something until she feels sleepy again.  At that time try getting back in bed. -Recommend getting in bed much later than she currently is send she does not fall asleep until about 3 AM.  Advised to turn off all lights and sounds once in bed.  Advised to take the trazodone  nightly. - traZODone  (DESYREL ) 50 MG tablet; Take 0.5-1 tablets (25-50 mg total) by mouth at bedtime as needed for sleep.  Dispense: 30 tablet; Refill: 4  5. Major depressive disorder, recurrent episode, mild (HCC) Doing better on Zoloft . - sertraline  (ZOLOFT ) 50 MG tablet; Take 1 tablet (50 mg total) by mouth daily.  Dispense: 90 tablet; Refill: 1  6. Vitamin D  deficiency Previous vitamin D  level in the high normal range.  Agree with cutting back from weekly to every other week of high-dose vitamin D .  7. Chronic bilateral low back pain without sciatica She is awaiting approval of MRI.  I may have my CMA call Dr. Mavis office to see where they are in this process.  In any event I would like for her to have an MRI on the chart by the time forms have to be completed next year for her continued disability payments.  8. Need for shingles vaccine Second Shingrix  vaccine given today.   Patient was given the opportunity to ask questions.  Patient verbalized understanding of the plan and was able to repeat key elements of the  plan.   This documentation was completed using Paediatric nurse.  Any transcriptional errors are unintentional.  Orders Placed This Encounter  Procedures   Varicella-zoster vaccine IM     Requested Prescriptions   Signed Prescriptions Disp Refills   metFORMIN  (GLUCOPHAGE ) 500 MG tablet 90 tablet 6    Sig: Take 1 tablet (500 mg total) by mouth 3 (three) times daily.   sertraline  (ZOLOFT ) 50 MG tablet 90 tablet 1    Sig: Take 1 tablet (50 mg total) by mouth daily.   traZODone  (DESYREL ) 50 MG tablet 30 tablet 4    Sig: Take 0.5-1 tablets (25-50 mg total) by mouth at bedtime as needed for sleep.    Return in about 4 months (around 08/30/2024).  Barnie Louder, MD, FACP

## 2024-04-29 NOTE — Patient Instructions (Addendum)
 VISIT SUMMARY:  Today, we discussed your ongoing medical conditions and disability status. We reviewed your current medications and made some adjustments to your treatment plan to help manage your symptoms more effectively.  YOUR PLAN:  - Discitis of the thoracic spine:  You are awaiting insurance approval for a repeat MRI, which is necessary for your disability documentation and to manage your condition. Please stay in contact with your neurosurgeon's office regarding the approval status.  -HYPERTENSION: Hypertension is high blood pressure. Your blood pressure is currently well-controlled with Cozaar  and furosemide . You did not report any significant symptoms recently.  -OBESITY: Obesity is a condition characterized by excessive body weight. You are under medical weight management with metformin  and are experiencing slow but steady weight loss. Continue following your low carbohydrate diet and daily walking routine.  -VITAMIN D  DEFICIENCY: Vitamin D  deficiency means you have low levels of vitamin D . Your levels are now adequate with the current reduced dosing schedule of high-dose vitamin D  every other week.  -INSOMNIA: Insomnia is difficulty falling or staying asleep. You have trouble initiating sleep despite using trazodone . We discussed sleep hygiene practices: take trazodone  nightly one hour before bedtime, adjust your bedtime if not sleepy between 10 PM and 11 PM, eliminate lights and sounds in bed, and avoid caffeine 3-4 hours before bedtime.  -DEPRESSION: Depression is a mood disorder causing persistent feelings of sadness and loss of interest. Your symptoms have improved with Zoloft , though emotional stressors have been noted.  -GENERAL HEALTH MAINTENANCE: You are due for your second shingles vaccine and we discussed the hepatitis B vaccination. We will administer the second shingles vaccine and start the hepatitis B vaccine series if available.  INSTRUCTIONS:  Please follow up with  your neurosurgeon's office regarding the MRI approval status. Schedule a nurse visit for your second hepatitis B vaccine one month after the first dose. Continue with your current medications and follow the sleep hygiene practices we discussed.

## 2024-05-12 ENCOUNTER — Encounter (INDEPENDENT_AMBULATORY_CARE_PROVIDER_SITE_OTHER): Payer: Self-pay | Admitting: Family Medicine

## 2024-05-12 ENCOUNTER — Ambulatory Visit (INDEPENDENT_AMBULATORY_CARE_PROVIDER_SITE_OTHER): Admitting: Family Medicine

## 2024-05-12 VITALS — BP 106/76 | HR 92 | Temp 98.2°F | Ht <= 58 in | Wt 292.0 lb

## 2024-05-12 DIAGNOSIS — E559 Vitamin D deficiency, unspecified: Secondary | ICD-10-CM

## 2024-05-12 DIAGNOSIS — E669 Obesity, unspecified: Secondary | ICD-10-CM | POA: Diagnosis not present

## 2024-05-12 DIAGNOSIS — Z6841 Body Mass Index (BMI) 40.0 and over, adult: Secondary | ICD-10-CM | POA: Diagnosis not present

## 2024-05-12 DIAGNOSIS — R7303 Prediabetes: Secondary | ICD-10-CM

## 2024-05-12 MED ORDER — VITAMIN D (ERGOCALCIFEROL) 1.25 MG (50000 UNIT) PO CAPS: 50000.0000 [IU] | ORAL_CAPSULE | ORAL | 0 refills | Status: DC

## 2024-05-12 NOTE — Progress Notes (Signed)
 Office: [phone]  /  Fax: [phone]  WEIGHT SUMMARY AND BIOMETRICS  Anthropometric Measurements Height: 4' 9 (1.448 m) Weight: 292 lb (132.5 kg) BMI (Calculated): 63.17 Weight at Last Visit: 293 lb Weight Lost Since Last Visit: 1 lb Weight Gained Since Last Visit: 0 Starting Weight: 292 lb Total Weight Loss (lbs): 13 lb (5.897 kg) Peak Weight: 305 lb   Body Composition  Body Fat %: 62.6 % Fat Mass (lbs): 182.8 lbs Muscle Mass (lbs): 103.6 lbs Visceral Fat Rating : 29   Other Clinical Data Fasting: yes Labs: no Today's Visit #: 15 Starting Date: 01/01/23    Chief Complaint: OBESITY    [name] is a 53 year old female with obesity who presents for a follow-up on her weight management plan.  She is adhering to a low-carbohydrate diet approximately 90% of the time and engages in walking for fifteen minutes daily. She has achieved a weight loss of one pound in the last month, totaling fourteen pounds since initiating the plan. Hunger is well-controlled, and cravings are infrequent. Her diet mainly consists of protein and salads, and she is contemplating adding more variety, such as yogurt and fruit, especially during the summer months.  Her current medications include metformin , taken three times daily, and vitamin D . She uses a pill box to manage her medication schedule effectively. She has a history of prediabetes, with a fasting glucose level of 107 and an improved hemoglobin A1c of 5.7. Her cholesterol levels have improved, with LDL now below 100. She was noted to be slightly dehydrated during her last lab work.  Family history includes her mother, who was a heart patient and had issues with dry kidneys due to furosemide  use. She reports always feeling thirsty.      PHYSICAL EXAM:  Blood pressure 106/76, pulse 92, temperature 98.2 F (36.8 C), height 4' 9 (1.448 m), weight 292 lb (132.5 kg), SpO2 95%. Body mass  index is 63.19 kg/m.  DIAGNOSTIC DATA REVIEWED:  BMET    Component Value Date/Time   NA 141 03/31/2024 0802   K 4.0 03/31/2024 0802   CL 104 03/31/2024 0802   CO2 19 (L) 03/31/2024 0802   GLUCOSE 107 (H) 03/31/2024 0802   GLUCOSE 109 (H) 05/03/2021 0922   BUN 17 03/31/2024 0802   CREATININE 1.03 (H) 03/31/2024 0802   CREATININE 1.07 (H) 05/03/2021 0922   CALCIUM 9.5 03/31/2024 0802   GFRNONAA >60 02/28/2021 1721   Lab Results  Component Value Date   HGBA1C 5.7 (H) 03/31/2024   HGBA1C 5.7 (H) 01/17/2021   Lab Results  Component Value Date   INSULIN  19.9 03/31/2024   INSULIN  20.1 01/01/2023   Lab Results  Component Value Date   TSH 4.470 11/20/2023   CBC    Component Value Date/Time   WBC 6.3 01/01/2023 0928   WBC 8.0 03/24/2021 0933   RBC 4.51 01/01/2023 0928   RBC 4.79 03/24/2021 0933   HGB 13.5 01/01/2023 0928   HCT 41.1 01/01/2023 0928   PLT 356 01/01/2023 0928   MCV 91 01/01/2023 0928   MCH 29.9 01/01/2023 0928   MCH 29.4 03/24/2021 0933   MCHC 32.8 01/01/2023 0928   MCHC 33.6 03/24/2021 0933   RDW 13.0 01/01/2023 0928   Iron Studies No results found for: IRON, TIBC, FERRITIN, IRONPCTSAT Lipid Panel     Component Value Date/Time   CHOL 152 03/31/2024 0802   TRIG 105 03/31/2024 0802   HDL 40 03/31/2024  0802   LDLCALC 93 03/31/2024 0802   Hepatic Function Panel     Component Value Date/Time   PROT 7.0 03/31/2024 0802   ALBUMIN 4.5 03/31/2024 0802   AST 19 03/31/2024 0802   ALT 20 03/31/2024 0802   ALKPHOS 93 03/31/2024 0802   BILITOT 0.4 03/31/2024 0802      Component Value Date/Time   TSH 4.470 11/20/2023 0926   Nutritional Lab Results  Component Value Date   VD25OH 51.8 03/31/2024   VD25OH 68.6 11/20/2023   VD25OH 56.1 06/12/2023     Assessment and Plan Assessment & Plan Obesity She adheres to a low-carb diet approximately 90% of the time and engages in 15 minutes of daily walking. She has achieved a total weight loss  of 14 pounds, with a 1-pound reduction in the past month. She reports no significant hunger or cravings. Muscle building is crucial for enhancing metabolism, as pharmacological options are unavailable. Chair yoga is recommended as a low-impact exercise to strengthen muscles without joint discomfort. Gradual weight loss is emphasized to prevent metabolic slowdown and ensure sustainable weight management. - Continue low-carb diet - Encourage 15 minutes of daily walking - Introduce chair yoga for muscle strengthening - Monitor weight loss progress  Prediabetes Fasting glucose is 107, showing improvement from last year. Hemoglobin A1c is 5.7, indicating continued improvement in glycemic control. She is on metformin  three times daily and manages well with a pill box system. Gradual weight loss is emphasized to prevent metabolic slowdown and ensure sustainable weight management. - Continue metformin  three times daily - Monitor fasting glucose and hemoglobin A1c  Vitamin D  Deficiency Vitamin D  level is 51, within the target range of 50-60. Current vitamin D  supplementation is effective and will be continued. - Refill vitamin D  prescription  General Health Maintenance Electrolytes, liver function, and cholesterol levels are within normal limits. LDL cholesterol is below 100, which is favorable. Adequate hydration is advised, especially in the summer heat, to prevent renal complications. She was slightly dehydrated during the last lab test, highlighting the need for sufficient fluid intake. - Encourage adequate hydration - Continue monitoring electrolytes, liver function, and cholesterol levels     She was informed of the importance of frequent follow up visits to maximize her success with intensive lifestyle modifications for her multiple health conditions. Follow up in 4 weeks   Louann Penton, MD `

## 2024-05-27 ENCOUNTER — Other Ambulatory Visit: Payer: Self-pay | Admitting: Critical Care Medicine

## 2024-06-09 ENCOUNTER — Ambulatory Visit (INDEPENDENT_AMBULATORY_CARE_PROVIDER_SITE_OTHER): Admitting: Family Medicine

## 2024-06-19 ENCOUNTER — Encounter: Payer: Self-pay | Admitting: Cardiology

## 2024-06-20 ENCOUNTER — Other Ambulatory Visit: Payer: Self-pay | Admitting: Internal Medicine

## 2024-06-20 DIAGNOSIS — M4644 Discitis, unspecified, thoracic region: Secondary | ICD-10-CM

## 2024-06-20 NOTE — Telephone Encounter (Signed)
 Requested medication (s) are due for refill today: yes  Requested medication (s) are on the active medication list: yes  Last refill:  12/05/23  Future visit scheduled: yes  Notes to clinic:  Unable to refill per protocol, cannot delegate.      Requested Prescriptions  Pending Prescriptions Disp Refills   methocarbamol  (ROBAXIN ) 500 MG tablet [Pharmacy Med Name: Methocarbamol  500 MG Oral Tablet] 90 tablet 0    Sig: TAKE 1 TABLET BY MOUTH EVERY 8 HOURS AS NEEDED FOR MUSCLE SPASM     Not Delegated - Analgesics:  Muscle Relaxants Failed - 06/20/2024  3:51 PM      Failed - This refill cannot be delegated      Passed - Valid encounter within last 6 months    Recent Outpatient Visits           1 month ago Essential hypertension   [facility] [place]. [facility] [place] Barnie NOVAK, MD   2 months ago Administrative encounter   [facility] Of [place]. [facility] Vic[place] Barnie NOVAK, MD   7 months ago Essential hypertension   [facility] Of [place]. [facility] Barnie NOVAK, MD   10 months ago Prediabetes   [facility] [place] [facility] Dept Of [place]. [facility] [place], Jon HERO, [place]   1 year ago Primary hypertension   [facility] - [facility] Dept Of . [facility] [place] Belvie BRAVO, MD       Future Appointments             In 2 months Vicci Barnie NOVAK, MD [facility] [place] - A Dept Of [place]. [facility], [place]

## 2024-07-08 ENCOUNTER — Encounter (INDEPENDENT_AMBULATORY_CARE_PROVIDER_SITE_OTHER): Payer: Self-pay | Admitting: Family Medicine

## 2024-07-08 ENCOUNTER — Ambulatory Visit (INDEPENDENT_AMBULATORY_CARE_PROVIDER_SITE_OTHER): Admitting: Family Medicine

## 2024-07-08 VITALS — BP 113/78 | HR 87 | Temp 97.7°F | Ht <= 58 in | Wt 294.0 lb

## 2024-07-08 DIAGNOSIS — I10 Essential (primary) hypertension: Secondary | ICD-10-CM | POA: Diagnosis not present

## 2024-07-08 DIAGNOSIS — Z6841 Body Mass Index (BMI) 40.0 and over, adult: Secondary | ICD-10-CM | POA: Diagnosis not present

## 2024-07-08 DIAGNOSIS — E559 Vitamin D deficiency, unspecified: Secondary | ICD-10-CM

## 2024-07-08 DIAGNOSIS — E66813 Obesity, class 3: Secondary | ICD-10-CM

## 2024-07-08 MED ORDER — VITAMIN D (ERGOCALCIFEROL) 1.25 MG (50000 UNIT) PO CAPS: 50000.0000 [IU] | ORAL_CAPSULE | ORAL | 0 refills | Status: AC

## 2024-07-08 NOTE — Progress Notes (Signed)
 Office: [phone]  /  Fax: [phone]  WEIGHT SUMMARY AND BIOMETRICS  Anthropometric Measurements Height: 4' 9 (1.448 m) Weight: 294 lb (133.4 kg) BMI (Calculated): 63.6 Weight at Last Visit: 292 lb Weight Lost Since Last Visit: 0 Weight Gained Since Last Visit: 2 lb Starting Weight: 292 lb Total Weight Loss (lbs): 11 lb (4.99 kg) Peak Weight: 305 lb   Body Composition  Body Fat %: 64.4 % Fat Mass (lbs): 189.6 lbs Muscle Mass (lbs): 99.4 lbs Visceral Fat Rating : 30   Other Clinical Data Fasting: yes Labs: no Today's Visit #: 16 Starting Date: 01/01/23    Chief Complaint: OBESITY   [name] is a 53 year old female with obesity who presents for a follow-up on her obesity treatment plan and progress assessment.  She adheres to a low carbohydrate diet approximately 90% of the time and engages in physical activity by walking 15 minutes daily, seven days a week. Despite these efforts, she has gained two pounds over the last two months. She faces challenges with meal planning and preparation, often resorting to 'grab and go' meals due to a lack of organization. She is interested in trying new recipes, particularly those that can be prepared in a slow cooker, to aid in meal planning and adherence to her dietary goals.  She is currently being treated for vitamin D  deficiency with ergocalciferol , 50,000 IU weekly, and requests a refill. Her most recent vitamin D  level in June was 51, which is within the normal range. She also takes a multivitamin containing [phone] IU of vitamin D .  She has a history of hypertension, which is currently controlled with losartan .  She experiences fluid retention, particularly in her hands, as indicated by her rings feeling tight. She uses online grocery shopping and typically finds all necessary items available.      PHYSICAL EXAM:  Blood pressure 113/78, pulse 87, temperature 97.7 F (36.5  C), height 4' 9 (1.448 m), weight 294 lb (133.4 kg), SpO2 95%. Body mass index is 63.62 kg/m.  DIAGNOSTIC DATA REVIEWED:  BMET    Component Value Date/Time   NA 141 03/31/2024 0802   K 4.0 03/31/2024 0802   CL 104 03/31/2024 0802   CO2 19 (L) 03/31/2024 0802   GLUCOSE 107 (H) 03/31/2024 0802   GLUCOSE 109 (H) 05/03/2021 0922   BUN 17 03/31/2024 0802   CREATININE 1.03 (H) 03/31/2024 0802   CREATININE 1.07 (H) 05/03/2021 0922   CALCIUM 9.5 03/31/2024 0802   GFRNONAA >60 02/28/2021 1721   Lab Results  Component Value Date   HGBA1C 5.7 (H) 03/31/2024   HGBA1C 5.7 (H) 01/17/2021   Lab Results  Component Value Date   INSULIN  19.9 03/31/2024   INSULIN  20.1 01/01/2023   Lab Results  Component Value Date   TSH 4.470 11/20/2023   CBC    Component Value Date/Time   WBC 6.3 01/01/2023 0928   WBC 8.0 03/24/2021 0933   RBC 4.51 01/01/2023 0928   RBC 4.79 03/24/2021 0933   HGB 13.5 01/01/2023 0928   HCT 41.1 01/01/2023 0928   PLT 356 01/01/2023 0928   MCV 91 01/01/2023 0928   MCH 29.9 01/01/2023 0928   MCH 29.4 03/24/2021 0933   MCHC 32.8 01/01/2023 0928   MCHC 33.6 03/24/2021 0933   RDW 13.0 01/01/2023 0928   Iron Studies No results found for: IRON, TIBC, FERRITIN, IRONPCTSAT Lipid Panel     Component Value Date/Time   CHOL 152  03/31/2024 0802   TRIG 105 03/31/2024 0802   HDL 40 03/31/2024 0802   LDLCALC 93 03/31/2024 0802   Hepatic Function Panel     Component Value Date/Time   PROT 7.0 03/31/2024 0802   ALBUMIN 4.5 03/31/2024 0802   AST 19 03/31/2024 0802   ALT 20 03/31/2024 0802   ALKPHOS 93 03/31/2024 0802   BILITOT 0.4 03/31/2024 0802      Component Value Date/Time   TSH 4.470 11/20/2023 0926   Nutritional Lab Results  Component Value Date   VD25OH 51.8 03/31/2024   VD25OH 68.6 11/20/2023   VD25OH 56.1 06/12/2023     Assessment and Plan Assessment & Plan Class 3 obesity Class 3 obesity with a recent weight gain of two pounds  over the last two months. Following a low carbohydrate diet 90% of the time and engaging in physical activity by walking 15 minutes daily. Potential fluid retention possibly due to increased sodium intake from eating out. - Provide a list of slow cooker recipes focusing on protein and vegetables. - Encourage meal planning and grocery shopping to ensure availability of healthy ingredients. - Advise keeping dinner calories around 500 with at least 35 grams of protein.  Vitamin D  deficiency Vitamin D  deficiency managed with ergocalciferol  50,000 IU weekly. Recent labs from June show vitamin D  level at 51, within the desired range. Discussed potential for vitamin D  levels to decrease in fall and winter, but current prescription should mitigate this risk. She takes the prescription every Sunday and also takes a multivitamin containing [phone] IU of vitamin D . - Refill ergocalciferol  50,000 IU weekly. - Schedule labs to recheck vitamin D  levels before the holidays. - Ensure fasting for the next visit to complete necessary labs.  Essential hypertension Essential hypertension well-controlled with losartan . Blood pressure today is 113/78. Continued weight loss efforts are aimed at potentially reducing medication dependency. - Continue losartan  for blood pressure management. - Encourage weight loss efforts to potentially reduce medication dependency.    Stacey Kaiser was informed of the importance of frequent follow up visits to maximize her success with intensive lifestyle modifications for her obesity and obesity related health conditions as recommended by USPSTF and CMS guidelines   Stacey Penton, MD

## 2024-07-20 ENCOUNTER — Other Ambulatory Visit: Payer: Self-pay | Admitting: Internal Medicine

## 2024-07-20 DIAGNOSIS — M4644 Discitis, unspecified, thoracic region: Secondary | ICD-10-CM

## 2024-08-05 ENCOUNTER — Ambulatory Visit (INDEPENDENT_AMBULATORY_CARE_PROVIDER_SITE_OTHER): Admitting: Family Medicine

## 2024-08-29 ENCOUNTER — Encounter (INDEPENDENT_AMBULATORY_CARE_PROVIDER_SITE_OTHER): Payer: Self-pay | Admitting: Family Medicine

## 2024-09-01 ENCOUNTER — Ambulatory Visit: Attending: Internal Medicine | Admitting: Internal Medicine

## 2024-09-01 VITALS — BP 117/78 | HR 102 | Temp 97.8°F | Ht <= 58 in | Wt 294.0 lb

## 2024-09-01 DIAGNOSIS — Z5320 Procedure and treatment not carried out because of patient's decision for unspecified reasons: Secondary | ICD-10-CM

## 2024-09-01 DIAGNOSIS — Z23 Encounter for immunization: Secondary | ICD-10-CM | POA: Diagnosis not present

## 2024-09-01 DIAGNOSIS — R7303 Prediabetes: Secondary | ICD-10-CM | POA: Diagnosis not present

## 2024-09-01 DIAGNOSIS — J3489 Other specified disorders of nose and nasal sinuses: Secondary | ICD-10-CM

## 2024-09-01 DIAGNOSIS — J069 Acute upper respiratory infection, unspecified: Secondary | ICD-10-CM

## 2024-09-01 DIAGNOSIS — I10 Essential (primary) hypertension: Secondary | ICD-10-CM | POA: Diagnosis not present

## 2024-09-01 DIAGNOSIS — Z6841 Body Mass Index (BMI) 40.0 and over, adult: Secondary | ICD-10-CM

## 2024-09-01 DIAGNOSIS — G8929 Other chronic pain: Secondary | ICD-10-CM

## 2024-09-01 DIAGNOSIS — M546 Pain in thoracic spine: Secondary | ICD-10-CM

## 2024-09-01 DIAGNOSIS — F331 Major depressive disorder, recurrent, moderate: Secondary | ICD-10-CM
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
## 2024-09-02 ENCOUNTER — Encounter (INDEPENDENT_AMBULATORY_CARE_PROVIDER_SITE_OTHER): Payer: Self-pay

## 2024-09-03 ENCOUNTER — Ambulatory Visit (INDEPENDENT_AMBULATORY_CARE_PROVIDER_SITE_OTHER): Payer: Self-pay | Admitting: Family Medicine

## 2024-09-04 ENCOUNTER — Other Ambulatory Visit: Payer: Self-pay | Admitting: Internal Medicine

## 2024-09-04 DIAGNOSIS — M4644 Discitis, unspecified, thoracic region: Secondary | ICD-10-CM

## 2024-09-08 ENCOUNTER — Ambulatory Visit

## 2024-09-12 ENCOUNTER — Encounter: Payer: Self-pay | Admitting: Internal Medicine

## 2024-09-15 ENCOUNTER — Ambulatory Visit

## 2024-10-11 ENCOUNTER — Other Ambulatory Visit: Payer: Self-pay | Admitting: Internal Medicine

## 2024-10-11 DIAGNOSIS — M4644 Discitis, unspecified, thoracic region: Secondary | ICD-10-CM

## 2024-10-22 ENCOUNTER — Other Ambulatory Visit: Payer: Self-pay | Admitting: Internal Medicine

## 2024-10-22 DIAGNOSIS — R7989 Other specified abnormal findings of blood chemistry: Secondary | ICD-10-CM

## 2024-10-22 DIAGNOSIS — R635 Abnormal weight gain: Secondary | ICD-10-CM

## 2024-10-28 ENCOUNTER — Other Ambulatory Visit: Payer: Self-pay | Admitting: Internal Medicine

## 2024-10-28 DIAGNOSIS — F330 Major depressive disorder, recurrent, mild: Secondary | ICD-10-CM

## 2024-11-03 ENCOUNTER — Encounter: Payer: Self-pay | Admitting: *Deleted

## 2024-12-30 ENCOUNTER — Ambulatory Visit: Admitting: Internal Medicine
